# Patient Record
Sex: Female | Born: 1965 | Race: White | Hispanic: No | Marital: Married | Smoking: Never smoker
Health system: Southern US, Community
[De-identification: ages and names within clinical notes are randomized; demographics above are authoritative.]

## PROBLEM LIST (undated history)

## (undated) DIAGNOSIS — K649 Unspecified hemorrhoids: Secondary | ICD-10-CM

## (undated) DIAGNOSIS — E039 Hypothyroidism, unspecified: Secondary | ICD-10-CM

## (undated) DIAGNOSIS — C50419 Malignant neoplasm of upper-outer quadrant of unspecified female breast: Secondary | ICD-10-CM

## (undated) DIAGNOSIS — Z9221 Personal history of antineoplastic chemotherapy: Secondary | ICD-10-CM

## (undated) DIAGNOSIS — E038 Other specified hypothyroidism: Secondary | ICD-10-CM

## (undated) DIAGNOSIS — F419 Anxiety disorder, unspecified: Secondary | ICD-10-CM

## (undated) DIAGNOSIS — Z9889 Other specified postprocedural states: Secondary | ICD-10-CM

## (undated) DIAGNOSIS — Z923 Personal history of irradiation: Secondary | ICD-10-CM

## (undated) DIAGNOSIS — Z1379 Encounter for other screening for genetic and chromosomal anomalies: Secondary | ICD-10-CM

## (undated) DIAGNOSIS — R112 Nausea with vomiting, unspecified: Secondary | ICD-10-CM

## (undated) DIAGNOSIS — Z1371 Encounter for nonprocreative screening for genetic disease carrier status: Secondary | ICD-10-CM

## (undated) DIAGNOSIS — F988 Other specified behavioral and emotional disorders with onset usually occurring in childhood and adolescence: Secondary | ICD-10-CM

## (undated) DIAGNOSIS — R011 Cardiac murmur, unspecified: Secondary | ICD-10-CM

## (undated) DIAGNOSIS — R55 Syncope and collapse: Secondary | ICD-10-CM

## (undated) DIAGNOSIS — Z853 Personal history of malignant neoplasm of breast: Secondary | ICD-10-CM

## (undated) DIAGNOSIS — C50919 Malignant neoplasm of unspecified site of unspecified female breast: Secondary | ICD-10-CM

## (undated) DIAGNOSIS — G5603 Carpal tunnel syndrome, bilateral upper limbs: Secondary | ICD-10-CM

## (undated) HISTORY — DX: Malignant neoplasm of unspecified site of unspecified female breast: C50.919

## (undated) HISTORY — DX: Personal history of malignant neoplasm of breast: Z85.3

## (undated) HISTORY — PX: OTHER SURGICAL HISTORY: SHX169

## (undated) HISTORY — DX: Anxiety disorder, unspecified: F41.9

## (undated) HISTORY — PX: CYSTOSCOPY: SUR368

## (undated) HISTORY — DX: Hypothyroidism, unspecified: E03.9

## (undated) HISTORY — DX: Cardiac murmur, unspecified: R01.1

## (undated) HISTORY — DX: Syncope and collapse: R55

## (undated) HISTORY — DX: Unspecified hemorrhoids: K64.9

## (undated) HISTORY — DX: Other specified hypothyroidism: E03.8

## (undated) HISTORY — DX: Encounter for nonprocreative screening for genetic disease carrier status: Z13.71

## (undated) HISTORY — DX: Encounter for other screening for genetic and chromosomal anomalies: Z13.79

## (undated) HISTORY — DX: Other specified behavioral and emotional disorders with onset usually occurring in childhood and adolescence: F98.8

## (undated) HISTORY — DX: Malignant neoplasm of upper-outer quadrant of unspecified female breast: C50.419

---
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
## 1994-04-11 ENCOUNTER — Encounter: Payer: Self-pay | Admitting: Family Medicine

## 1994-04-11 LAB — CONVERTED CEMR LAB
Blood Glucose, Fasting: 89 mg/dL
Pap Smear: NORMAL
RBC count: 4.54 10*6/uL
TSH: 2 microintl units/mL
WBC, blood: 7.4 10*3/uL

## 1995-03-20 ENCOUNTER — Encounter: Payer: Self-pay | Admitting: Family Medicine

## 1995-03-20 LAB — CONVERTED CEMR LAB: Pap Smear: NORMAL

## 1995-12-06 ENCOUNTER — Encounter: Payer: Self-pay | Admitting: Family Medicine

## 1995-12-06 LAB — CONVERTED CEMR LAB: Blood Glucose, Fasting: 90 mg/dL

## 1995-12-09 ENCOUNTER — Encounter: Payer: Self-pay | Admitting: Family Medicine

## 1996-03-30 ENCOUNTER — Encounter: Payer: Self-pay | Admitting: Family Medicine

## 1996-03-30 LAB — CONVERTED CEMR LAB: WBC, blood: 10.6 10*3/uL

## 1996-05-05 ENCOUNTER — Encounter: Payer: Self-pay | Admitting: Family Medicine

## 1996-05-05 LAB — CONVERTED CEMR LAB: Blood Glucose, Fasting: 81 mg/dL

## 2004-04-03 ENCOUNTER — Encounter: Payer: Self-pay | Admitting: Family Medicine

## 2004-04-03 LAB — CONVERTED CEMR LAB
Blood Glucose, Fasting: 80 mg/dL
WBC, blood: 6.4 10*3/uL

## 2004-04-18 ENCOUNTER — Encounter: Payer: Self-pay | Admitting: Family Medicine

## 2004-04-18 ENCOUNTER — Other Ambulatory Visit: Admission: RE | Admit: 2004-04-18 | Discharge: 2004-04-18 | Payer: Self-pay | Admitting: Family Medicine

## 2004-04-18 LAB — CONVERTED CEMR LAB: Pap Smear: NORMAL

## 2006-04-16 ENCOUNTER — Ambulatory Visit: Payer: Self-pay | Admitting: Family Medicine

## 2006-04-22 ENCOUNTER — Encounter: Payer: Self-pay | Admitting: Family Medicine

## 2006-04-22 LAB — CONVERTED CEMR LAB
RBC count: 4.68 10*6/uL
TSH: 3.445 microintl units/mL
WBC, blood: 7.9 10*3/uL

## 2006-04-30 ENCOUNTER — Other Ambulatory Visit: Admission: RE | Admit: 2006-04-30 | Discharge: 2006-04-30 | Payer: Self-pay | Admitting: Family Medicine

## 2006-04-30 ENCOUNTER — Encounter (INDEPENDENT_AMBULATORY_CARE_PROVIDER_SITE_OTHER): Payer: Self-pay | Admitting: Specialist

## 2006-04-30 ENCOUNTER — Ambulatory Visit: Payer: Self-pay | Admitting: Family Medicine

## 2006-04-30 LAB — CONVERTED CEMR LAB: Pap Smear: NORMAL

## 2007-09-01 ENCOUNTER — Ambulatory Visit: Payer: Self-pay | Admitting: Family Medicine

## 2007-10-23 DIAGNOSIS — Z9221 Personal history of antineoplastic chemotherapy: Secondary | ICD-10-CM

## 2007-10-23 DIAGNOSIS — Z923 Personal history of irradiation: Secondary | ICD-10-CM

## 2007-10-23 DIAGNOSIS — Z853 Personal history of malignant neoplasm of breast: Secondary | ICD-10-CM

## 2007-10-23 DIAGNOSIS — C50919 Malignant neoplasm of unspecified site of unspecified female breast: Secondary | ICD-10-CM

## 2007-10-23 HISTORY — DX: Personal history of irradiation: Z92.3

## 2007-10-23 HISTORY — DX: Personal history of antineoplastic chemotherapy: Z92.21

## 2007-10-23 HISTORY — DX: Personal history of malignant neoplasm of breast: Z85.3

## 2007-10-23 HISTORY — DX: Malignant neoplasm of unspecified site of unspecified female breast: C50.919

## 2007-11-24 ENCOUNTER — Ambulatory Visit: Payer: Self-pay | Admitting: Family Medicine

## 2007-11-24 DIAGNOSIS — E7800 Pure hypercholesterolemia, unspecified: Secondary | ICD-10-CM

## 2007-11-25 ENCOUNTER — Encounter: Payer: Self-pay | Admitting: Family Medicine

## 2007-12-26 ENCOUNTER — Other Ambulatory Visit: Admission: RE | Admit: 2007-12-26 | Discharge: 2007-12-26 | Payer: Self-pay | Admitting: Family Medicine

## 2007-12-26 ENCOUNTER — Ambulatory Visit: Payer: Self-pay | Admitting: Family Medicine

## 2007-12-26 ENCOUNTER — Encounter: Payer: Self-pay | Admitting: Family Medicine

## 2007-12-26 DIAGNOSIS — F900 Attention-deficit hyperactivity disorder, predominantly inattentive type: Secondary | ICD-10-CM

## 2007-12-31 ENCOUNTER — Encounter: Payer: Self-pay | Admitting: Family Medicine

## 2007-12-31 DIAGNOSIS — Z872 Personal history of diseases of the skin and subcutaneous tissue: Secondary | ICD-10-CM | POA: Insufficient documentation

## 2008-01-06 ENCOUNTER — Encounter: Payer: Self-pay | Admitting: Family Medicine

## 2008-01-08 ENCOUNTER — Encounter (INDEPENDENT_AMBULATORY_CARE_PROVIDER_SITE_OTHER): Payer: Self-pay | Admitting: *Deleted

## 2008-01-13 ENCOUNTER — Ambulatory Visit: Payer: Self-pay | Admitting: Family Medicine

## 2008-01-13 DIAGNOSIS — F410 Panic disorder [episodic paroxysmal anxiety] without agoraphobia: Secondary | ICD-10-CM | POA: Insufficient documentation

## 2008-01-13 DIAGNOSIS — F331 Major depressive disorder, recurrent, moderate: Secondary | ICD-10-CM | POA: Insufficient documentation

## 2008-01-13 DIAGNOSIS — F419 Anxiety disorder, unspecified: Secondary | ICD-10-CM

## 2008-01-26 ENCOUNTER — Ambulatory Visit: Payer: Self-pay | Admitting: Family Medicine

## 2008-03-03 ENCOUNTER — Telehealth: Payer: Self-pay | Admitting: Family Medicine

## 2008-03-30 ENCOUNTER — Encounter: Payer: Self-pay | Admitting: Family Medicine

## 2008-04-06 ENCOUNTER — Telehealth: Payer: Self-pay | Admitting: Family Medicine

## 2008-04-06 ENCOUNTER — Encounter: Payer: Self-pay | Admitting: Family Medicine

## 2008-04-19 ENCOUNTER — Ambulatory Visit: Payer: Self-pay | Admitting: Family Medicine

## 2008-04-19 DIAGNOSIS — K648 Other hemorrhoids: Secondary | ICD-10-CM

## 2008-04-30 ENCOUNTER — Encounter: Payer: Self-pay | Admitting: Family Medicine

## 2008-05-06 ENCOUNTER — Encounter: Payer: Self-pay | Admitting: Family Medicine

## 2008-05-07 DIAGNOSIS — C50919 Malignant neoplasm of unspecified site of unspecified female breast: Secondary | ICD-10-CM

## 2008-05-07 HISTORY — PX: BREAST BIOPSY: SHX20

## 2008-05-10 ENCOUNTER — Encounter: Payer: Self-pay | Admitting: Family Medicine

## 2008-05-10 ENCOUNTER — Ambulatory Visit: Payer: Self-pay | Admitting: General Surgery

## 2008-05-21 ENCOUNTER — Encounter: Payer: Self-pay | Admitting: Family Medicine

## 2008-05-22 ENCOUNTER — Ambulatory Visit: Payer: Self-pay | Admitting: Internal Medicine

## 2008-05-24 ENCOUNTER — Ambulatory Visit: Payer: Self-pay | Admitting: General Surgery

## 2008-05-27 ENCOUNTER — Encounter: Payer: Self-pay | Admitting: Family Medicine

## 2008-05-27 ENCOUNTER — Ambulatory Visit: Payer: Self-pay | Admitting: General Surgery

## 2008-05-27 DIAGNOSIS — Z853 Personal history of malignant neoplasm of breast: Secondary | ICD-10-CM | POA: Insufficient documentation

## 2008-05-27 DIAGNOSIS — C50412 Malignant neoplasm of upper-outer quadrant of left female breast: Secondary | ICD-10-CM

## 2008-05-27 DIAGNOSIS — C50419 Malignant neoplasm of upper-outer quadrant of unspecified female breast: Secondary | ICD-10-CM

## 2008-05-27 DIAGNOSIS — Z171 Estrogen receptor negative status [ER-]: Secondary | ICD-10-CM

## 2008-05-27 HISTORY — PX: OTHER SURGICAL HISTORY: SHX169

## 2008-05-27 HISTORY — PX: BREAST LUMPECTOMY: SHX2

## 2008-05-27 HISTORY — DX: Malignant neoplasm of upper-outer quadrant of unspecified female breast: C50.419

## 2008-05-31 ENCOUNTER — Encounter: Payer: Self-pay | Admitting: Family Medicine

## 2008-06-04 ENCOUNTER — Ambulatory Visit: Payer: Self-pay | Admitting: Internal Medicine

## 2008-06-14 ENCOUNTER — Other Ambulatory Visit: Payer: Self-pay

## 2008-06-16 ENCOUNTER — Ambulatory Visit: Payer: Self-pay | Admitting: General Surgery

## 2008-06-22 ENCOUNTER — Ambulatory Visit: Payer: Self-pay | Admitting: Internal Medicine

## 2008-07-12 ENCOUNTER — Encounter: Payer: Self-pay | Admitting: Family Medicine

## 2008-07-22 ENCOUNTER — Ambulatory Visit: Payer: Self-pay | Admitting: Internal Medicine

## 2008-08-22 ENCOUNTER — Ambulatory Visit: Payer: Self-pay | Admitting: Internal Medicine

## 2008-08-26 ENCOUNTER — Encounter: Payer: Self-pay | Admitting: Family Medicine

## 2008-09-21 ENCOUNTER — Ambulatory Visit: Payer: Self-pay | Admitting: Internal Medicine

## 2008-10-22 ENCOUNTER — Ambulatory Visit: Payer: Self-pay | Admitting: Internal Medicine

## 2008-11-22 ENCOUNTER — Ambulatory Visit: Payer: Self-pay | Admitting: Internal Medicine

## 2008-11-22 ENCOUNTER — Encounter: Payer: Self-pay | Admitting: Family Medicine

## 2008-12-20 ENCOUNTER — Ambulatory Visit: Payer: Self-pay | Admitting: Internal Medicine

## 2009-01-20 ENCOUNTER — Ambulatory Visit: Payer: Self-pay | Admitting: Internal Medicine

## 2009-02-09 ENCOUNTER — Ambulatory Visit: Payer: Self-pay | Admitting: Family Medicine

## 2009-02-19 ENCOUNTER — Ambulatory Visit: Payer: Self-pay | Admitting: Internal Medicine

## 2009-03-22 ENCOUNTER — Ambulatory Visit: Payer: Self-pay | Admitting: Internal Medicine

## 2009-03-30 ENCOUNTER — Ambulatory Visit: Payer: Self-pay | Admitting: General Surgery

## 2009-04-08 ENCOUNTER — Encounter: Payer: Self-pay | Admitting: Family Medicine

## 2009-04-12 ENCOUNTER — Encounter: Payer: Self-pay | Admitting: Family Medicine

## 2009-04-21 ENCOUNTER — Ambulatory Visit: Payer: Self-pay | Admitting: Internal Medicine

## 2009-05-22 ENCOUNTER — Ambulatory Visit: Payer: Self-pay | Admitting: Internal Medicine

## 2009-06-08 LAB — HM MAMMOGRAPHY: HM Mammogram: NORMAL

## 2009-06-10 ENCOUNTER — Encounter: Payer: Self-pay | Admitting: Family Medicine

## 2009-06-22 ENCOUNTER — Ambulatory Visit: Payer: Self-pay | Admitting: Internal Medicine

## 2009-07-14 ENCOUNTER — Encounter: Payer: Self-pay | Admitting: Family Medicine

## 2009-07-22 ENCOUNTER — Ambulatory Visit: Payer: Self-pay | Admitting: Internal Medicine

## 2009-07-22 ENCOUNTER — Ambulatory Visit: Payer: Self-pay | Admitting: Radiation Oncology

## 2009-07-22 HISTORY — PX: COLONOSCOPY: SHX174

## 2009-07-28 ENCOUNTER — Ambulatory Visit: Payer: Self-pay | Admitting: Gastroenterology

## 2009-07-28 LAB — HM COLONOSCOPY

## 2009-08-03 ENCOUNTER — Encounter: Payer: Self-pay | Admitting: Family Medicine

## 2009-08-11 ENCOUNTER — Ambulatory Visit: Payer: Self-pay | Admitting: Family Medicine

## 2009-08-16 ENCOUNTER — Other Ambulatory Visit: Admission: RE | Admit: 2009-08-16 | Discharge: 2009-08-16 | Payer: Self-pay | Admitting: Family Medicine

## 2009-08-16 ENCOUNTER — Encounter: Payer: Self-pay | Admitting: Family Medicine

## 2009-08-16 ENCOUNTER — Ambulatory Visit: Payer: Self-pay | Admitting: Family Medicine

## 2009-08-16 DIAGNOSIS — E039 Hypothyroidism, unspecified: Secondary | ICD-10-CM

## 2009-08-16 DIAGNOSIS — E038 Other specified hypothyroidism: Secondary | ICD-10-CM | POA: Insufficient documentation

## 2009-08-22 ENCOUNTER — Ambulatory Visit: Payer: Self-pay | Admitting: Internal Medicine

## 2009-08-22 ENCOUNTER — Ambulatory Visit: Payer: Self-pay | Admitting: Radiation Oncology

## 2009-09-21 ENCOUNTER — Ambulatory Visit: Payer: Self-pay | Admitting: Internal Medicine

## 2009-09-22 ENCOUNTER — Ambulatory Visit: Payer: Self-pay | Admitting: General Surgery

## 2009-10-20 ENCOUNTER — Encounter: Payer: Self-pay | Admitting: Family Medicine

## 2009-11-04 ENCOUNTER — Ambulatory Visit: Payer: Self-pay | Admitting: Internal Medicine

## 2009-11-22 ENCOUNTER — Ambulatory Visit: Payer: Self-pay | Admitting: Internal Medicine

## 2009-12-20 ENCOUNTER — Ambulatory Visit: Payer: Self-pay | Admitting: Internal Medicine

## 2010-01-20 ENCOUNTER — Ambulatory Visit: Payer: Self-pay | Admitting: Internal Medicine

## 2010-02-07 ENCOUNTER — Ambulatory Visit: Payer: Self-pay | Admitting: Family Medicine

## 2010-02-15 ENCOUNTER — Ambulatory Visit: Payer: Self-pay | Admitting: Family Medicine

## 2010-02-15 ENCOUNTER — Other Ambulatory Visit: Admission: RE | Admit: 2010-02-15 | Discharge: 2010-02-15 | Payer: Self-pay | Admitting: Family Medicine

## 2010-02-19 ENCOUNTER — Ambulatory Visit: Payer: Self-pay | Admitting: Internal Medicine

## 2010-02-23 ENCOUNTER — Encounter (INDEPENDENT_AMBULATORY_CARE_PROVIDER_SITE_OTHER): Payer: Self-pay | Admitting: *Deleted

## 2010-02-23 LAB — CONVERTED CEMR LAB: Pap Smear: NEGATIVE

## 2010-03-22 ENCOUNTER — Ambulatory Visit: Payer: Self-pay | Admitting: Internal Medicine

## 2010-03-31 ENCOUNTER — Ambulatory Visit: Payer: Self-pay | Admitting: General Surgery

## 2010-04-25 ENCOUNTER — Encounter: Payer: Self-pay | Admitting: Family Medicine

## 2010-04-26 ENCOUNTER — Ambulatory Visit: Payer: Self-pay | Admitting: Internal Medicine

## 2010-04-27 LAB — CANCER ANTIGEN 27.29: CA 27.29: 18.6 U/mL (ref 0.0–38.6)

## 2010-05-22 ENCOUNTER — Ambulatory Visit: Payer: Self-pay | Admitting: Internal Medicine

## 2010-05-30 ENCOUNTER — Encounter (INDEPENDENT_AMBULATORY_CARE_PROVIDER_SITE_OTHER): Payer: Self-pay | Admitting: *Deleted

## 2010-08-10 ENCOUNTER — Ambulatory Visit: Payer: Self-pay | Admitting: Radiation Oncology

## 2010-08-10 ENCOUNTER — Encounter: Payer: Self-pay | Admitting: Family Medicine

## 2010-08-22 ENCOUNTER — Ambulatory Visit: Payer: Self-pay | Admitting: Radiation Oncology

## 2010-10-22 ENCOUNTER — Ambulatory Visit: Payer: Self-pay | Admitting: Radiation Oncology

## 2010-10-26 ENCOUNTER — Ambulatory Visit: Payer: Self-pay | Admitting: General Surgery

## 2010-11-07 ENCOUNTER — Encounter: Payer: Self-pay | Admitting: Family Medicine

## 2010-11-08 ENCOUNTER — Other Ambulatory Visit: Payer: Self-pay | Admitting: Family Medicine

## 2010-11-08 ENCOUNTER — Ambulatory Visit
Admission: RE | Admit: 2010-11-08 | Discharge: 2010-11-08 | Payer: Self-pay | Source: Home / Self Care | Attending: Family Medicine | Admitting: Family Medicine

## 2010-11-08 LAB — LIPID PANEL
Cholesterol: 136 mg/dL (ref 0–200)
HDL: 32.4 mg/dL — ABNORMAL LOW (ref >39.00)
LDL Cholesterol: 89 mg/dL (ref 0–99)
Total CHOL/HDL Ratio: 4
Triglycerides: 74 mg/dL (ref 0.0–149.0)
VLDL: 14.8 mg/dL (ref 0.0–40.0)

## 2010-11-08 LAB — HEPATIC FUNCTION PANEL
ALT: 21 U/L (ref 0–35)
AST: 18 U/L (ref 0–37)
Albumin: 4.1 g/dL (ref 3.5–5.2)
Alkaline Phosphatase: 59 U/L (ref 39–117)
Bilirubin, Direct: 0.1 mg/dL (ref 0.0–0.3)
Total Bilirubin: 0.6 mg/dL (ref 0.3–1.2)
Total Protein: 6.5 g/dL (ref 6.0–8.3)

## 2010-11-08 LAB — BASIC METABOLIC PANEL
BUN: 13 mg/dL (ref 6–23)
CO2: 30 mEq/L (ref 19–32)
Calcium: 9.4 mg/dL (ref 8.4–10.5)
Chloride: 106 mEq/L (ref 96–112)
Creatinine, Ser: 0.7 mg/dL (ref 0.4–1.2)
GFR: 90.12 mL/min (ref >60.00)
Glucose, Bld: 87 mg/dL (ref 70–99)
Potassium: 4.7 mEq/L (ref 3.5–5.1)
Sodium: 140 mEq/L (ref 135–145)

## 2010-11-08 LAB — CBC WITH DIFFERENTIAL/PLATELET
Basophils Absolute: 0 10*3/uL (ref 0.0–0.1)
Basophils Relative: 0.3 % (ref 0.0–3.0)
Eosinophils Absolute: 0.1 10*3/uL (ref 0.0–0.7)
Eosinophils Relative: 1.8 % (ref 0.0–5.0)
HCT: 41 % (ref 36.0–46.0)
Hemoglobin: 13.9 g/dL (ref 12.0–15.0)
Lymphocytes Relative: 37.5 % (ref 12.0–46.0)
Lymphs Abs: 2.6 10*3/uL (ref 0.7–4.0)
MCHC: 33.8 g/dL (ref 30.0–36.0)
MCV: 91.3 fl (ref 78.0–100.0)
Monocytes Absolute: 0.7 10*3/uL (ref 0.1–1.0)
Monocytes Relative: 9.5 % (ref 3.0–12.0)
Neutro Abs: 3.5 10*3/uL (ref 1.4–7.7)
Neutrophils Relative %: 50.9 % (ref 43.0–77.0)
Platelets: 279 10*3/uL (ref 150.0–400.0)
RBC: 4.49 Mil/uL (ref 3.87–5.11)
RDW: 14.1 % (ref 11.5–14.6)
WBC: 6.9 10*3/uL (ref 4.5–10.5)

## 2010-11-08 LAB — TSH: TSH: 4.59 u[IU]/mL (ref 0.35–5.50)

## 2010-11-08 LAB — T4, FREE: Free T4: 0.72 ng/dL (ref 0.60–1.60)

## 2010-11-15 ENCOUNTER — Ambulatory Visit
Admission: RE | Admit: 2010-11-15 | Discharge: 2010-11-15 | Payer: Self-pay | Source: Home / Self Care | Attending: Family Medicine | Admitting: Family Medicine

## 2010-11-22 NOTE — Letter (Signed)
Summary: [facility]  [facility]   Imported By: Maryln Gottron 08/28/2010 13:36:00  _____________________________________________________________________  External Attachment:    Type:   Image     Comment:   External Document

## 2010-11-22 NOTE — Letter (Signed)
Summary: Nadara Eaton letter  [place] at [facility]  [address] [place], [place] [address]   Phone: [phone]  Fax: [phone]       05/30/2010 MRN: [id number]  ALDA GAULTNEY [address] [place], [place]  [address]  Dear Ms. [facility] [place], and  announce the retirement of Arta Silence, M.D., from full-time practice at the [facility] office effective April 20, 2010 and his plans of returning part-time.  It is important to Dr. Hetty Ely and to our practice that you understand that [facility] has seven physicians in our office for your health care needs.  We will continue to offer the same exceptional care that you have today.    Dr. Hetty Ely has spoken to many of you about his plans for retirement and returning part-time in the fall.   We will continue to work with you through the transition to schedule appointments for you in the office and meet the high standards that [place] is committed to.   Again, it is with great pleasure that we share the news that Dr. Hetty Ely will return to [facility] at [facility] in October of 2011 with a reduced schedule.    If you have any questions, or would like to request an appointment with one of our physicians, please call us at [phone] and press the option for Scheduling an appointment.  We take pleasure in providing you with excellent patient care and look forward to seeing you at your next office visit.  Our [facility] Physicians are:  Tillman Abide, M.D. Laurita Quint, M.D. Roxy Manns, M.D. Kerby Nora, M.D. Hannah Beat, M.D. Ruthe Mannan, M.D. We proudly welcomed Raechel Ache, M.D. and Eustaquio Boyden, M.D. to the practice in July/August 2011.  Sincerely,  [facility]

## 2010-11-22 NOTE — Letter (Signed)
Summary: Dr.Jeffrey Byrnett,Surgeon,Note  Dr.Jeffrey Byrnett,Surgeon,Note   Imported By: Beau Fanny 10/27/2009 11:10:13  _____________________________________________________________________  External Attachment:    Type:   Image     Comment:   External Document

## 2010-11-22 NOTE — Letter (Signed)
Summary: [facility]  [facility]   Imported By: Lanelle Bal 05/03/2010 11:40:40  _____________________________________________________________________  External Attachment:    Type:   Image     Comment:   External Document  Appended Document: [facility]    Clinical Lists Changes

## 2010-11-22 NOTE — Assessment & Plan Note (Signed)
Summary: 6 MONTH FOLLOW UP/RBHALSO WANTS REPEAT PAP BLOCK 10:30 TO ALL...   Vital Signs:  Patient profile:   45 year old female Height:      66 inches Weight:      202.25 pounds BMI:     32.76 Temp:     98.2 degrees F oral Pulse rate:   80 / minute Pulse rhythm:   regular BP sitting:   102 / 68  (right arm) Cuff size:   large  Vitals Entered By: Sydell Axon LPN (February 15, 2010 9:25 AM)  CC: Follow-up on thyroid and repeat pap, Preventive Care   History of Present Illness: Pt here to repeat Pap as her last 6 mos ago had no transformation zone cells. She has had breast cancer so doesn't want to take chances and wants to repeat today. She is also here for thyroid discussion as she repeated her labs last week. She feels well and is on new lifestyle with her husband who has lost weight. She has lost one pound from last visit but looks healthier.  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 Take One By Mouth Daily  Allergies: 1)  ! * Sutures 2)  ! Adhesive Tape 1/2"x10yd (Adhesive Tape)  Physical Exam  General:  Well-developed,well-nourished,in no acute distress; alert,appropriate and cooperative throughout examination Head:  Normocephalic and atraumatic without obvious abnormalities. No apparent alopecia or balding. Eyes:  No corneal or conjunctival inflammation noted. EOMI. Perrla. Funduscopic exam benign, without hemorrhages, exudates or papilledema. Vision grossly normal. Ears:  External ear exam shows no significant lesions or deformities.   Otoscopic examination reveals clear canals, tympanic membranes are intact bilaterally without bulging, retraction, inflammation or discharge. Hearing is grossly normal bilaterally. Nose:  External nasal examination shows no deformity or inflammation. Nasal mucosa are pink and moist without lesions or exudates. Mouth:  Oral mucosa and oropharynx without lesions or exudates.  Teeth in good repair. Neck:  No deformities, masses, or tenderness noted. Lungs:  Normal respiratory effort, chest expands symmetrically. Lungs are clear to auscultation, no crackles or wheezes. Heart:  Normal rate and regular rhythm. S1 and S2 normal without gallop, murmur, click, rub or other extra sounds. Rectal:  Not done. Genitalia:  Pelvic Exam:        External: normal female genitalia without lesions or masses        Vagina: normal without lesions or masses        Cervix: normal without lesions or masses        Adnexa: normal bimanual exam without masses or fullness        Uterus: normal by palpation        Pap smear: performed   Impression & Recommendations:  Problem # 1:  CARCINOMA, INFILTRATING DUCTAL, LEFT BREAST (ICD-174.9) Redid Pap today to insure no probs dure to lack of transition zone last time 6 mos ago.  Problem # 2:  HYPOTHYROIDISM, BORDERLINE (ICD-244.9) Assessment: Improved  Back to Euthyroid on no meds. Perhaps effect of chemo or radiation. Will follow and repeat again at Comp Exam..  Complete Medication List: 1)  Multivitamins  Tabs (Multiple vitamin) .... Take one by mouth daily  PAP Screening:    Last PAP smear:  08/16/2009  Mammogram Screening:    Last Mammogram:  06/08/2009  Osteoporosis Risk Assessment:  Risk Factors for Fracture or Low Bone Density:   Race (White or Asian):     yes   Smoking status:       never   Thyroid disease:     yes  Immunization & Chemoprophylaxis:    Tetanus vaccine: Td  (11/22/2001)  Patient Instructions: 1)  RTC 10/11 for Comp Exam, labs  prior.  Current Allergies (reviewed today): ! * SUTURES ! ADHESIVE TAPE 1/2"X10YD (ADHESIVE TAPE)

## 2010-11-22 NOTE — Letter (Signed)
Summary: Results Follow up Letter  [place] [facility]  [address] [place], [place] [address]   Phone: [phone]  Fax: [phone]    02/23/2010 MRN: [id number]  Susan Soto [address] [place], [place]  [address]  Dear Ms. Zeisler,  The following are the results of your recent test(s):  Test         Result    Pap Smear:        Normal __X___  Not Normal _____ Comments: Please repeat in one year. ______________________________________________________ Cholesterol: LDL(Bad cholesterol):         Your goal is less than:         HDL (Good cholesterol):       Your goal is more than: Comments:  ______________________________________________________ Mammogram:        Normal _____  Not Normal _____ Comments:  ___________________________________________________________________ Hemoccult:        Normal _____  Not normal _______ Comments:    _____________________________________________________________________ Other Tests:    We routinely do not discuss normal results over the telephone.  If you desire a copy of the results, or you have any questions about this information we can discuss them at your next office visit.   Sincerely,     Laurita Quint, MD

## 2010-11-23 NOTE — Assessment & Plan Note (Signed)
Summary: CPX/DLO   Vital Signs:  Patient profile:   45 year old female Height:      66 inches Weight:      211.50 pounds BMI:     34.26 Temp:     98.4 degrees F oral Pulse rate:   92 / minute Pulse rhythm:   regular BP sitting:   106 / 76  (right arm) Cuff size:   large  Vitals Entered By: Delilah Shan CMA Duncan Dull) (November 15, 2010 11:03 AM) CC: CPX   History of Present Illness: Pt had mammo last month and then was seen by Dr Lemar Livings and said fine. Will get regular mammo in 6 mos. Her Pap in Apr was nml. Her PE this time last year was fine. She has no complaints and feels well. She feels her chemo has worsened her ADD and has caused menopause in that she hasn't had a period since her chemo. She reads a lot of info on ADD. She is worried about upcoming audit at work. We discussed "Chemo brain." She had been on Wellbutrin in the past but does not feel she tolerated that well in that she had skin reaction she is sure waqs from that medication and does not want to tr it again. She does not want to be back on stimulants and I would concur with that in the face of her anxiety.  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 Take One By Mouth Daily  Allergies: 1)  ! * Sutures 2)  ! Adhesive Tape 1/2"x10yd (Adhesive Tape) 3)   ! Wellbutrin  Past History:  Past Surgical History: Last updated: 08/16/2009 NSVD x 2 H/O BARTHOLIN'S CYSECTOMY Cystectomy of Sutures from episiotomy. ECHO --NORMAL :(03/1996) L BREAST BIOPSY INFITR DUCTAL CA (DR BYRNETT) 05/07/08 L BREAST WIDE EXCISION WITH SENTINEL NODE BX (DR BYRNETT) 05/27/08 Colonoscopy INT HEMM  (Dr Bluford Kaufmann)  08/02/2009  Family History: Last updated: 11/15/2010 Father dec 73s Heroine overdose. (pt in 3rd grade) Mother A 60  shot 27 yrs ago R arm across chest and out L arm PTSD HTN Chronic Pain Fibromyalgia ADD Brother A 40 ADHD Brother A 33 Anxiety CV: +MGM R.F. HBP: +PGM DM: NEGATIVE GOUT/ARTHRITIS PROSTATE CANCER +(LUNG PGF (SMOKER) BREAST CANCER +MGM OVARIAN/UTERINE CANCER: NEGATIVE COLON CANCER:  DEPRESSION: + SELF// THROUGHOUT MOTHER ETOH ABUSE : NEGATIVE DRUG ABUSE : +FATHER , BOTH SIDES OTHER: + STROKE DECEASED PGM HYPOGLYCEMIC : MGM  Social History: Last updated: 12/31/2007 Occupation: Child psychotherapist  Lives with husband TWO children  Risk Factors: Alcohol Use: 0 (11/15/2010) Caffeine Use: 2 (11/15/2010) Exercise: no (11/15/2010)  Risk Factors: Smoking Status: never (11/15/2010) Passive Smoke Exposure: no (11/15/2010)  Family History: Father dec 65s Heroine overdose. (pt in 3rd grade) Mother A 60  shot 27 yrs ago R arm across chest and out L arm PTSD HTN Chronic  Pain Fibromyalgia ADD Brother A 40 ADHD Brother A 33 Anxiety CV: +MGM R.F. HBP: +PGM DM: NEGATIVE GOUT/ARTHRITIS PROSTATE CANCER +(LUNG PGF (SMOKER) BREAST CANCER +MGM OVARIAN/UTERINE CANCER: NEGATIVE COLON CANCER:  DEPRESSION: + SELF// THROUGHOUT MOTHER ETOH ABUSE : NEGATIVE DRUG ABUSE : +FATHER , BOTH SIDES OTHER: + STROKE DECEASED PGM HYPOGLYCEMIC : MGM  Review of Systems General:  Complains of sweats; denies chills, fatigue, fever, weakness, and weight loss; occas since chemo. Eyes:  Denies blurring, eye irritation, and eye pain. ENT:  Denies decreased hearing,  earache, and ringing in ears. CV:  Denies chest pain or discomfort, fainting, fatigue, palpitations, shortness of breath with exertion, swelling of feet, and swelling of hands. Resp:  Denies cough, shortness of breath, and wheezing. GI:  Denies abdominal pain, bloody stools, change in bowel habits, constipation, dark tarry stools, diarrhea, indigestion, loss of appetite, nausea, vomiting, vomiting blood, and yellowish skin color. GU:  Complains of nocturia; denies discharge, dysuria, and urinary frequency. MS:  Denies joint pain, low back pain, muscle aches, cramps, and stiffness. Derm:  Complains of dryness; denies itching and rash; chronic. Neuro:  Denies numbness, poor balance, tingling, and tremors.  Physical Exam  General:  Well-developed,well-nourished,in no acute distress; alert,appropriate and cooperative throughout examination Head:  Normocephalic and atraumatic without obvious abnormalities. No apparent alopecia or balding. Sinuses NT. Eyes:  No corneal or conjunctival inflammation noted. EOMI. Perrla. Funduscopic exam benign, without hemorrhages, exudates or papilledema. Vision grossly normal. Ears:  External ear exam shows no significant lesions or deformities.  Otoscopic examination reveals clear canals, tympanic membranes are intact bilaterally without bulging, retraction, inflammation or discharge. Hearing is grossly normal bilaterally. Nose:  External nasal examination shows no deformity or inflammation. Nasal mucosa are pink and moist without lesions or exudates. Mouth:  Oral mucosa and oropharynx without lesions or exudates.  Teeth in good repair. Neck:  No deformities, masses, or tenderness noted. Chest Wall:  No deformities, masses, or tenderness noted. Breasts:  Not done, recent exam by Dr Lemar Livings Lungs:  Normal respiratory effort, chest expands symmetrically. Lungs are clear to auscultation, no crackles or wheezes. Heart:  Normal rate and regular rhythm. S1 and S2 normal  without gallop, murmur, click, rub or other extra sounds. Abdomen:  Bowel sounds positive,abdomen soft and non-tender without masses, organomegaly or hernias noted. Rectal:  Not done. Genitalia:  Not done, last 6 mos ago. Msk:  No deformity or scoliosis noted of thoracic or lumbar spine.   Pulses:  R and L carotid,radial,femoral,dorsalis pedis and posterior tibial pulses are full and equal bilaterally Extremities:  No clubbing, cyanosis, edema, or deformity noted with normal full range of motion of all joints.   Neurologic:  No cranial nerve deficits noted. Station and gait are normal. Sensory, motor and coordinative functions appear intact. Skin:  Intact without suspicious lesions or rashes Cervical Nodes:  No lymphadenopathy noted Inguinal Nodes:  No significant adenopathy Psych:  Cognition and judgment appear intact. Alert and cooperative with normal attention span and concentration. No apparent delusions, illusions, hallucinations   Impression & Recommendations:  Problem # 1:  ROUTINE GENERAL MEDICAL EXAM@HEALTH  CARE FACL (ICD-V70.0) Assessment Comment Only  Problem # 2:  HYPOTHYROIDISM, BORDERLINE (ICD-244.9) Assessment: Unchanged Well controlled and euthyroid w/o medication. Cont. Labs Reviewed: TSH: 4.59 (11/08/2010)    Chol: 136 (11/08/2010)   HDL: 32.40 (11/08/2010)   LDL: 89 (11/08/2010)   TG: 74.0 (11/08/2010)  Problem # 3:  CARCINOMA, INFILTRATING DUCTAL, LEFT BREAST (ICD-174.9) Assessment: Unchanged Followed by Dr Lemar Livings. Had recent  mammo.  Problem # 4:  ANXIETY STATE, UNSPECIFIED (ICD-300.00) Assessment: Unchanged  Will try Buspar Her updated medication list for this problem includes:    Buspirone Hcl 5 Mg Tabs (Buspirone hcl) .Marland Kitchen... 1/2 tab by mouth two times a day  Discussed medication use and relaxation techniques.   Problem # 5:  PURE HYPERCHOLESTEROLEMIA (ICD-272.0) Assessment: Unchanged Well controlled. Labs Reviewed: SGOT: 18 (11/08/2010)   SGPT: 21  (11/08/2010)   HDL:32.40 (11/08/2010)  LDL:89 (11/08/2010)  Chol:136 (11/08/2010)  Trig:74.0 (11/08/2010)  Problem # 6:  ADD (ICD-314.00) Assessment: Unchanged No stimulants. Declines Strattera. Will follow.  Complete Medication List: 1)  Juice Plus Fibre Liqd (Nutritional supplements) .... Once daily 2)  Fish Oil Oil (Fish oil) .... 900 mg. once daily 3)  Probiotics  .... Take 1 tablet by mouth once a day 4)  L-tyrosine 500 Mg Caps (Tyrosine) .... Once daily  (for add) 5)  Replens Gel (Vaginal lubricant) .... Probiotic vaginal supplement once weekly. 6)  Buspirone Hcl 5 Mg Tabs (Buspirone hcl) .... 1/2 tab by mouth two times a day  Patient Instructions: 1)  RTC one month  Prescriptions: BUSPIRONE HCL 5 MG TABS (BUSPIRONE HCL) 1/2 tab by mouth two times a day  #30 x 12   Entered and Authorized by:   Shaune Leeks MD   Signed by:   Shaune Leeks MD on 11/15/2010   Method used:   Print then Give to Patient   RxID:   [id number]    Orders Added: 1)  Est. Patient 40-64 years [[address]]    Current Allergies (reviewed today): ! * SUTURES ! ADHESIVE TAPE 1/2"X10YD (ADHESIVE TAPE) ! WELLBUTRIN

## 2010-12-06 ENCOUNTER — Ambulatory Visit: Payer: Self-pay | Admitting: Internal Medicine

## 2010-12-07 LAB — CANCER ANTIGEN 27.29: CA 27.29: 31.7 U/mL (ref 0.0–38.6)

## 2010-12-07 NOTE — Letter (Signed)
Summary: [facility]  [facility]   Imported By: Kassie Mends 11/27/2010 08:33:45  _____________________________________________________________________  External Attachment:    Type:   Image     Comment:   External Document

## 2010-12-13 ENCOUNTER — Ambulatory Visit (INDEPENDENT_AMBULATORY_CARE_PROVIDER_SITE_OTHER): Payer: PRIVATE HEALTH INSURANCE | Admitting: Family Medicine

## 2010-12-13 ENCOUNTER — Encounter: Payer: Self-pay | Admitting: Family Medicine

## 2010-12-13 DIAGNOSIS — F988 Other specified behavioral and emotional disorders with onset usually occurring in childhood and adolescence: Secondary | ICD-10-CM

## 2010-12-13 DIAGNOSIS — F411 Generalized anxiety disorder: Secondary | ICD-10-CM

## 2010-12-19 NOTE — Assessment & Plan Note (Signed)
Summary: 1 MONTH F/U JRT   Vital Signs:  Patient profile:   45 year old female Weight:      212 pounds Temp:     98.1 degrees F oral Pulse rate:   80 / minute Pulse rhythm:   regular BP sitting:   104 / 68  (right arm) Cuff size:   large  Vitals Entered By: Sydell Axon LPN (December 13, 2010 9:01 AM) CC: Follow-up visit on new medication, ran out because of the way that it was written   History of Present Illness: Pt here for 6 week followup after being tried on Buspar for anxiety, also having ADD and not wanting to try Wellbutrin again due to skin reaction and not wanting to use benzos due to ADD as well.  The Buspar helpewd take the edge off at the audit done.  She has increased to  a whole tab in the AM and then has recently increased to whole at night. She has seen some improvement but still having trouble focusing. She has piles of work on her desk and then worries about what to do first...wasting lots of time. She did fine on her audit. She cannot let herself get so worked up again...it was very stressful.  She is willing to increase Buspar first and then see if see might need nonstimulant ADD med.   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 Once Daily 2)  Fish Oil   Oil (Fish Oil) .... 900 Mg. Once Daily 3)  Probiotics .... Take 1 Tablet By Mouth Once A Day 4)  L-Tyrosine 500 Mg Caps (Tyrosine) .... Once Daily  (For Add) 5)  Replens  Gel (Vaginal Lubricant) .... Probiotic Vaginal Supplement Once Weekly. 6)  Buspirone Hcl 5 Mg  Tabs (Buspirone Hcl) .... 1/2 Tab By Mouth Two Times A Day  Allergies: 1)  ! * Sutures 2)  ! Adhesive Tape 1/2"x10yd (Adhesive Tape) 3)  ! Wellbutrin  Physical Exam  General:  Well-developed,well-nourished,in no acute distress; alert,appropriate and cooperative throughout examination Psych:  Cognition and judgment appear intact. Alert and cooperative with normal attention span and concentration. No apparent delusions, illusions, hallucinations   Impression & Recommendations:  Problem # 1:  ANXIETY STATE, UNSPECIFIED (ICD-300.00) Assessment Improved  Slightly better but still has a ways to go. Slowly increase to 15 two times a day by starting with 1/2 of 15. If becomes  too sedated in the afternoon, lower meds. Her updated medication list for this problem includes:    Buspirone Hcl 15 Mg Tabs (Buspirone hcl) ..... One tab by mouth two times a day  Discussed medication use and relaxation techniques.   Problem # 2:  ADD (ICD-314.00) Assessment: Unchanged If anxiety better controlled does not improve concentration, will try Strattera.  Complete Medication List: 1)  Juice Plus Fibre Liqd (Nutritional supplements) .... Once daily 2)  Fish Oil Oil (Fish oil) .... 900 mg. once daily 3)  Probiotics  .... Take 1 tablet by mouth once a day 4)  L-tyrosine 500 Mg Caps (Tyrosine) .... Once daily  (for add) 5)  Replens Gel (Vaginal lubricant) .... Probiotic vaginal supplement once weekly. 6)  Buspirone Hcl 15 Mg Tabs (Buspirone hcl) .... One tab by mouth two times a day  Patient Instructions: 1)  RTC 2 months Prescriptions: BUSPIRONE HCL 15 MG TABS (BUSPIRONE HCL) one tab by mouth two times a day  #60 x 12   Entered and Authorized by:   Shaune Leeks MD   Signed by:   Shaune Leeks MD on 12/13/2010   Method used:   Electronically to        [facility] [id number][facility] (retail)       [address] [place], [place]  [address]       Ph: [phone] or  [phone]       Fax: [phone]   RxID:   [id number]    Orders Added: 1)  Est. Patient Level III [[address]644]    Current Allergies (reviewed today): ! * SUTURES ! ADHESIVE TAPE 1/2"X10YD (ADHESIVE TAPE) ! WELLBUTRIN

## 2010-12-21 ENCOUNTER — Ambulatory Visit: Payer: Self-pay | Admitting: Internal Medicine

## 2010-12-30 ENCOUNTER — Encounter: Payer: Self-pay | Admitting: Family Medicine

## 2011-02-28 ENCOUNTER — Ambulatory Visit (INDEPENDENT_AMBULATORY_CARE_PROVIDER_SITE_OTHER): Payer: PRIVATE HEALTH INSURANCE | Admitting: Family Medicine

## 2011-02-28 ENCOUNTER — Encounter: Payer: Self-pay | Admitting: Family Medicine

## 2011-02-28 DIAGNOSIS — F988 Other specified behavioral and emotional disorders with onset usually occurring in childhood and adolescence: Secondary | ICD-10-CM

## 2011-02-28 DIAGNOSIS — F411 Generalized anxiety disorder: Secondary | ICD-10-CM

## 2011-02-28 NOTE — Patient Instructions (Signed)
RTC 2 months

## 2011-02-28 NOTE — Assessment & Plan Note (Addendum)
Had variable response to Buspar but finally unable to take 15mg  bid.  Do not want to prescribe anxiolytics/benzos. Will refer to Psych for combo of anxiety and ADD if nonstimulant ADD meds do not work.

## 2011-02-28 NOTE — Assessment & Plan Note (Addendum)
Pt wants to try nonstimulant meds for ADD trmt before referral to Psych.  WIll try Strattera. Given start kit to 80mg . Given enough to take for 2 mos

## 2011-02-28 NOTE — Progress Notes (Signed)
  Subjective:    Patient ID: Susan Soto, female    DOB: 1966/03/13, 45 y.o.   MRN: [id number]  HPI Pt here for two month recheck of Anxiety and ADD. She was increased stepwise last time to 15mg  bid of Buspar for her anxiety. This gotr her sick. So she tried 10mg  and couldn't tolerate that as well. She has been on nothing for the ADD hoping that getting her anxiety under control may get the ADD good enough to operate efficiently at work. She has read three books on ADD   She is biting her cheeks and grinding her teeth. Work is a lot more intense and her caseload has gotten larger. She attributes all of this to fueling her anxiety. She could not tolerate Buspar.  She also had a period in Mar after no period since her chemo for breast cancer last year., She has seen an ADD specialist in [place] in the past. Her son has seen an ADD specialist in [place].   Review of Systems  Constitutional: Negative for fever, chills, diaphoresis, activity change and fatigue.  HENT: Negative for ear pain, congestion, rhinorrhea and postnasal drip.   Eyes: Negative for redness.  Respiratory: Negative for cough, chest tightness, shortness of breath and wheezing.   Cardiovascular: Negative for chest pain.       Objective:   Physical Exam  Constitutional: She appears well-developed and well-nourished. No distress.  HENT:  Head: Normocephalic and atraumatic.  Right Ear: External ear normal.  Left Ear: External ear normal.  Nose: Nose normal.  Mouth/Throat: Oropharynx is clear and moist. No oropharyngeal exudate.  Eyes: Conjunctivae and EOM are normal. Pupils are equal, round, and reactive to light.  Neck: Normal range of motion. Neck supple. No thyromegaly present.  Cardiovascular: Normal rate, regular rhythm and normal heart sounds.   Pulmonary/Chest: Effort normal and breath sounds normal. She has no wheezes. She has no rales.  Lymphadenopathy:    She has no cervical adenopathy.  Skin: She is not  diaphoretic.  Psychiatric: She has a normal mood and affect. Her behavior is normal. Judgment and thought content normal.       Mood/affect nml but subdued.           Assessment & Plan:

## 2011-05-08 ENCOUNTER — Ambulatory Visit: Payer: PRIVATE HEALTH INSURANCE | Admitting: Family Medicine

## 2011-05-09 ENCOUNTER — Ambulatory Visit: Payer: PRIVATE HEALTH INSURANCE | Admitting: Family Medicine

## 2011-05-10 ENCOUNTER — Ambulatory Visit: Payer: PRIVATE HEALTH INSURANCE | Admitting: Family Medicine

## 2011-05-17 ENCOUNTER — Ambulatory Visit (INDEPENDENT_AMBULATORY_CARE_PROVIDER_SITE_OTHER): Payer: PRIVATE HEALTH INSURANCE | Admitting: Family Medicine

## 2011-05-17 ENCOUNTER — Encounter: Payer: Self-pay | Admitting: Family Medicine

## 2011-05-17 DIAGNOSIS — F411 Generalized anxiety disorder: Secondary | ICD-10-CM

## 2011-05-17 DIAGNOSIS — F988 Other specified behavioral and emotional disorders with onset usually occurring in childhood and adolescence: Secondary | ICD-10-CM

## 2011-05-17 MED ORDER — ATOMOXETINE HCL 80 MG PO CAPS: 80.0000 mg | ORAL_CAPSULE | Freq: Every day | ORAL | Status: DC

## 2011-05-17 NOTE — Assessment & Plan Note (Signed)
Better ability to concentrate and attend to particular subjects and tasks. Will increase dose to 80 mg of Strattera. Discussed taking with food.

## 2011-05-17 NOTE — Progress Notes (Signed)
  Subjective:    Patient ID: Susan Soto, female    DOB: 24-Nov-1965, 45 y.o.   MRN: [id number]  HPI Pt here for 2 month followup. She was given Strattera last time for ADD to see if that would help , hoping in turn to alleviate some of her stressors and the resulting anxiety therefrom. She had significant problems initially with GI upset and remembering to take it. She has tolerated it well for the last month and wants to keep taking it. It has helped with organizing and remembering and she has been more productive at her job. She has been taking it every other day and none the last two days due to being out of medication (she was given samples). She is having less anxiousness and seems to be doing better than on anything else at this point. She would like to get an even better response if possible and is willing to increase dose. She has done a fair amount of reading about the medication.   Review of Systems  Constitutional: Negative for fever, chills, diaphoresis, activity change and fatigue.  HENT: Negative for ear pain, congestion, rhinorrhea and postnasal drip.   Eyes: Negative for redness.  Respiratory: Negative for cough, chest tightness, shortness of breath and wheezing.   Cardiovascular: Negative for chest pain.  Psychiatric/Behavioral: Positive for decreased concentration (improved) and agitation (improved). The patient is nervous/anxious (improved).        Objective:   Physical Exam  Constitutional: She appears well-developed and well-nourished. No distress.  HENT:  Head: Normocephalic and atraumatic.  Right Ear: External ear normal.  Left Ear: External ear normal.  Nose: Nose normal.  Mouth/Throat: Oropharynx is clear and moist. No oropharyngeal exudate.  Eyes: Conjunctivae and EOM are normal. Pupils are equal, round, and reactive to light.  Neck: Normal range of motion. Neck supple. No thyromegaly present.  Cardiovascular: Normal rate, regular rhythm and normal heart sounds.     Pulmonary/Chest: Effort normal and breath sounds normal. She has no wheezes. She has no rales.  Lymphadenopathy:    She has no cervical adenopathy.  Skin: She is not diaphoretic.  Psychiatric: She has a normal mood and affect. Her behavior is normal. Judgment and thought content normal.          Assessment & Plan:

## 2011-05-17 NOTE — Patient Instructions (Addendum)
RTC for recheck before the end of the year.

## 2011-05-17 NOTE — Assessment & Plan Note (Signed)
Improved with better ability to concentrate. Cont Strattera at increased dose.

## 2011-05-23 ENCOUNTER — Ambulatory Visit: Payer: Self-pay | Admitting: General Surgery

## 2011-06-29 ENCOUNTER — Ambulatory Visit: Payer: Self-pay | Admitting: Internal Medicine

## 2011-06-30 LAB — CANCER ANTIGEN 27.29: CA 27.29: 22.3 U/mL (ref 0.0–38.6)

## 2011-07-23 ENCOUNTER — Ambulatory Visit: Payer: Self-pay | Admitting: Internal Medicine

## 2011-08-23 ENCOUNTER — Ambulatory Visit: Payer: Self-pay | Admitting: Internal Medicine

## 2011-10-04 ENCOUNTER — Ambulatory Visit: Payer: PRIVATE HEALTH INSURANCE | Admitting: Family Medicine

## 2011-10-10 ENCOUNTER — Encounter: Payer: Self-pay | Admitting: Family Medicine

## 2011-10-10 ENCOUNTER — Ambulatory Visit (INDEPENDENT_AMBULATORY_CARE_PROVIDER_SITE_OTHER): Payer: PRIVATE HEALTH INSURANCE | Admitting: Family Medicine

## 2011-10-10 DIAGNOSIS — F988 Other specified behavioral and emotional disorders with onset usually occurring in childhood and adolescence: Secondary | ICD-10-CM

## 2011-10-10 DIAGNOSIS — F411 Generalized anxiety disorder: Secondary | ICD-10-CM

## 2011-10-10 NOTE — Assessment & Plan Note (Signed)
Stable and adequately controlled for now. Cont Strattera 80mg .

## 2011-10-10 NOTE — Patient Instructions (Signed)
RTC when able after the end of January for PE with Dr Reece Agar, labs prior.

## 2011-10-10 NOTE — Assessment & Plan Note (Signed)
Doing well at this point due to improved concentration at work and less stress therefrom. Cont.

## 2011-10-10 NOTE — Progress Notes (Signed)
  Subjective:    Patient ID: Susan Soto, female    DOB: Mar 13, 1966, 45 y.o.   MRN: [id number]  HPI Pt here for recheck of anxiety and difficulty concentrating. She has been taking Strattera and increased the dose last time. Had had some difficulties with GI upset but handled those. Shew typically does not eaqt breakfast and takes with lunch typically. She knows to take breakfast if possible. She is doing ok although knows a stimulant would improve things. She may go back to her Psych for stimulant. She feels well and has no complaints.    Review of Systems  Constitutional: Negative for fever, chills, diaphoresis, activity change and fatigue.  HENT: Negative for ear pain, congestion, rhinorrhea and postnasal drip.   Eyes: Negative for redness.  Respiratory: Negative for cough, chest tightness, shortness of breath and wheezing.   Cardiovascular: Negative for chest pain.       Objective:   Physical Exam  Constitutional: She appears well-developed and well-nourished. No distress.  HENT:  Head: Normocephalic and atraumatic.  Right Ear: External ear normal.  Left Ear: External ear normal.  Nose: Nose normal.  Mouth/Throat: Oropharynx is clear and moist. No oropharyngeal exudate.  Eyes: Conjunctivae and EOM are normal. Pupils are equal, round, and reactive to light.  Neck: Normal range of motion. Neck supple. No thyromegaly present.  Cardiovascular: Normal rate, regular rhythm and normal heart sounds.   Pulmonary/Chest: Effort normal and breath sounds normal. She has no wheezes. She has no rales.  Lymphadenopathy:    She has no cervical adenopathy.  Skin: She is not diaphoretic.          Assessment & Plan:

## 2011-11-06 ENCOUNTER — Telehealth: Payer: Self-pay | Admitting: *Deleted

## 2011-11-06 NOTE — Telephone Encounter (Signed)
Received faxed form from pharmacy requesting prior authorization on Strattera. Called and requested that [employer] send over the paperwork to start the process. Paperwork is in your in box.

## 2011-11-07 ENCOUNTER — Encounter: Payer: Self-pay | Admitting: *Deleted

## 2011-11-07 ENCOUNTER — Other Ambulatory Visit: Payer: Self-pay | Admitting: Family Medicine

## 2011-11-07 DIAGNOSIS — E039 Hypothyroidism, unspecified: Secondary | ICD-10-CM

## 2011-11-07 DIAGNOSIS — Z0000 Encounter for general adult medical examination without abnormal findings: Secondary | ICD-10-CM

## 2011-11-07 NOTE — Telephone Encounter (Signed)
Filled and placed in my out box. 

## 2011-11-08 ENCOUNTER — Telehealth: Payer: Self-pay | Admitting: *Deleted

## 2011-11-08 NOTE — Telephone Encounter (Signed)
PA done for Strattera. Med was denied and she will need another med. Form in your IN box.

## 2011-11-11 NOTE — Telephone Encounter (Signed)
Please notify insurance did not cover straterra.  Has she seen psych for ADHD in past or been treated with stimulants in past?

## 2011-11-12 ENCOUNTER — Other Ambulatory Visit (INDEPENDENT_AMBULATORY_CARE_PROVIDER_SITE_OTHER): Payer: PRIVATE HEALTH INSURANCE

## 2011-11-12 DIAGNOSIS — Z0000 Encounter for general adult medical examination without abnormal findings: Secondary | ICD-10-CM

## 2011-11-12 DIAGNOSIS — E039 Hypothyroidism, unspecified: Secondary | ICD-10-CM

## 2011-11-12 NOTE — Telephone Encounter (Signed)
Message left for patient to return my call.  

## 2011-11-12 NOTE — Telephone Encounter (Signed)
Spoke with patient and it was an error on her part with incorrect insurance card. She corrected things with the pharmacy and med has been covered.

## 2011-11-13 LAB — BASIC METABOLIC PANEL
BUN: 8 mg/dL (ref 6–24)
CO2: 21 mmol/L (ref 20–32)
Calcium: 9.3 mg/dL (ref 8.7–10.2)
Chloride: 102 mmol/L (ref 97–108)
GFR calc Af Amer: 121 mL/min/{1.73_m2} (ref >59)
Glucose: 88 mg/dL (ref 65–99)
Potassium: 3.5 mmol/L (ref 3.5–5.2)

## 2011-11-13 LAB — LIPID PANEL
Chol/HDL Ratio: 3.2 ratio units (ref 0.0–4.4)
HDL: 48 mg/dL (ref >39)

## 2011-11-13 LAB — TSH: TSH: 5.53 u[IU]/mL — ABNORMAL HIGH (ref 0.450–4.500)

## 2011-11-13 LAB — T4, FREE: Free T4: 0.9 ng/dL (ref 0.82–1.77)

## 2011-11-19 ENCOUNTER — Other Ambulatory Visit (HOSPITAL_COMMUNITY)
Admission: RE | Admit: 2011-11-19 | Discharge: 2011-11-19 | Disposition: A | Discharge status: Home / Self Care | Payer: PRIVATE HEALTH INSURANCE | Source: Ambulatory Visit | Attending: Family Medicine | Admitting: Family Medicine

## 2011-11-19 ENCOUNTER — Ambulatory Visit (INDEPENDENT_AMBULATORY_CARE_PROVIDER_SITE_OTHER): Payer: PRIVATE HEALTH INSURANCE | Admitting: Family Medicine

## 2011-11-19 ENCOUNTER — Encounter: Payer: PRIVATE HEALTH INSURANCE | Admitting: Family Medicine

## 2011-11-19 ENCOUNTER — Encounter: Payer: Self-pay | Admitting: Family Medicine

## 2011-11-19 VITALS — BP 110/80 | HR 84 | Temp 98.6°F | Ht 65.5 in | Wt 214.8 lb

## 2011-11-19 DIAGNOSIS — Z0000 Encounter for general adult medical examination without abnormal findings: Secondary | ICD-10-CM | POA: Insufficient documentation

## 2011-11-19 DIAGNOSIS — Z124 Encounter for screening for malignant neoplasm of cervix: Secondary | ICD-10-CM

## 2011-11-19 DIAGNOSIS — F988 Other specified behavioral and emotional disorders with onset usually occurring in childhood and adolescence: Secondary | ICD-10-CM

## 2011-11-19 DIAGNOSIS — E039 Hypothyroidism, unspecified: Secondary | ICD-10-CM

## 2011-11-19 DIAGNOSIS — Z01419 Encounter for gynecological examination (general) (routine) without abnormal findings: Secondary | ICD-10-CM | POA: Insufficient documentation

## 2011-11-19 DIAGNOSIS — C50919 Malignant neoplasm of unspecified site of unspecified female breast: Secondary | ICD-10-CM

## 2011-11-19 NOTE — Assessment & Plan Note (Signed)
Discussed subclinical hypothyroidism. rtc 6 mo for recheck, as well as discussion of possible synthroid. Endorsed hair changes (s/p chemo as well), weight gain, chronic constipation. Endorses heat intolerance, denies cold intolerance.

## 2011-11-19 NOTE — Assessment & Plan Note (Addendum)
Reviewed preventative protocols and updated unless pt declined. States UTD tetanus. 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 reviewed all blood work in detail Wants to receive pap smears yearly.

## 2011-11-19 NOTE — Assessment & Plan Note (Signed)
Not an issue, removed from problem list.

## 2011-11-19 NOTE — Assessment & Plan Note (Signed)
Continue straterra. Considering starting fish oil to help add.

## 2011-11-19 NOTE — Patient Instructions (Addendum)
Good to see you today. We will keep eye on thyroid function as you're borderline low. Call us with questions. Return in 6 months for ADD recheck and thyroid check, sooner if needed.

## 2011-11-19 NOTE — Progress Notes (Signed)
Subjective:    Patient ID: Susan Soto, female    DOB: 06/04/1966, 46 y.o.   MRN: [id number]  HPI CC: CPE with pap  H/o breast cancer, followed by Dr. Lemar Livings (surgery) and Sherrlyn Hock (onc) (last mammogram this summer, normal).  Gets yearly.  Preventative: Last CPE w/ Dr. Hetty Ely. Pap smear then.  Never had abnormal pap smear.  Getting yearly pap smears.  Would like yearly. Flu at work. Tdap at work. Seat belt 100% time. Sunscreen use discussed.  LMP last week.  Prior to that 8 mo.  S/p chemo, ?perimenopausal  PA not covered for straterra - actually pt's mistake with wrong insurance card, so now covered.  Thinks stimulants work better, but prior PCP not comfortable prescribing.  Caffeine: 1 cup coffee in am Lives with husband and 1 son, daughter in college, 1 dog and 1 cat, 5-6 outside cats Occupation: SW desk job Activity: no regular activity Diet: joined [employer], good water, fruits/vegetables daily, red meat 3x/wk, fish 2x/wk.  Medications and allergies reviewed and updated in chart.  Past histories reviewed and updated if relevant as below. Patient Active Problem List  Diagnoses  . CARCINOMA, INFILTRATING DUCTAL, LEFT BREAST  . HYPOTHYROIDISM, BORDERLINE  . PURE HYPERCHOLESTEROLEMIA  . ANXIETY STATE, UNSPECIFIED  . ADD  . HEMORRHOIDS, INTERNAL, WITH BLEEDING  . URTICARIA, HX OF (RECURRENT)   Past Medical History  Diagnosis Date  . ADD (attention deficit disorder)   . Anxiety    Past Surgical History  Procedure Date  . Bartholin's cystectomy   . Episiotomy   . Cystectomy     of sutures from episiotomy  . Breast biopsy 05/07/08    Left (infitr ductal CA)- Dr. Lemar Livings  . Breast wide excision 05/27/08    Left-with sentinel node bx (Dr. Lemar Livings)   History  Substance Use Topics  . Smoking status: Never Smoker   . Smokeless tobacco: Never Used  . Alcohol Use: No   Family History  Problem Relation Age of Onset  . Drug abuse Father     Heroin  .  Hypertension Mother   . Fibromyalgia Mother   . ADD / ADHD Mother   . ADD / ADHD Brother   . Anxiety disorder Brother   . Stroke Maternal Grandmother   . Hypertension Paternal Grandmother   . Cancer Maternal Grandmother     breast  . Depression Mother    Allergies  Allergen Reactions  . Bupropion Hcl     REACTION: Hives  . Buspar (Buspirone Hcl) Nausea And Vomiting   Current Outpatient Prescriptions on File Prior to Visit  Medication Sig Dispense Refill  . atomoxetine (STRATTERA) 80 MG capsule Take 1 capsule (80 mg total) by mouth daily.  30 capsule  12  . Nutritional Supplements (JUICE PLUS FIBRE) LIQD Take by mouth daily.        . Omega-3 Fatty Acids (RA FISH OIL) 900 MG CAPS Take 1 capsule by mouth daily.         Review of Systems  Constitutional: Negative for fever, chills, activity change, appetite change, fatigue and unexpected weight change.  HENT: Negative for hearing loss and neck pain.   Eyes: Negative for visual disturbance.  Respiratory: Negative for cough, chest tightness, shortness of breath and wheezing.   Cardiovascular: Negative for chest pain, palpitations and leg swelling.  Gastrointestinal: Negative for nausea, vomiting, abdominal pain, diarrhea, constipation, blood in stool and abdominal distention.  Genitourinary: Negative for hematuria and difficulty urinating.  Musculoskeletal: Negative for myalgias and  arthralgias.  Skin: Negative for rash.  Neurological: Negative for dizziness, seizures, syncope and headaches.  Hematological: Does not bruise/bleed easily.  Psychiatric/Behavioral: Negative for dysphoric mood. The patient is not nervous/anxious.        Objective:   Physical Exam  Nursing note and vitals reviewed. Constitutional: She is oriented to person, place, and time. She appears well-developed and well-nourished. No distress.  HENT:  Head: Normocephalic and atraumatic.  Right Ear: External ear normal.  Left Ear: External ear normal.  Nose:  Nose normal.  Mouth/Throat: Oropharynx is clear and moist. No oropharyngeal exudate.  Eyes: Conjunctivae and EOM are normal. Pupils are equal, round, and reactive to light. No scleral icterus.  Neck: Normal range of motion. Neck supple. No thyromegaly present.  Cardiovascular: Normal rate, regular rhythm, normal heart sounds and intact distal pulses.   No murmur heard. Pulses:      Radial pulses are 2+ on the right side, and 2+ on the left side.  Pulmonary/Chest: Effort normal and breath sounds normal. No respiratory distress. She has no wheezes. She has no rales.  Abdominal: Soft. Bowel sounds are normal. She exhibits no distension and no mass. There is no tenderness. There is no rebound and no guarding.  Genitourinary: Vagina normal and uterus normal. Pelvic exam was performed with patient supine. There is no rash, tenderness, lesion or injury on the right labia. There is no rash, tenderness, lesion or injury on the left labia. Uterus is not deviated. Cervix exhibits no motion tenderness, no discharge and no friability. Right adnexum displays no mass, no tenderness and no fullness. Left adnexum displays no mass, no tenderness and no fullness. No erythema, tenderness or bleeding around the vagina. No vaginal discharge found.  Musculoskeletal: Normal range of motion.  Lymphadenopathy:    She has no cervical adenopathy.  Neurological: She is alert and oriented to person, place, and time.       CN grossly intact, station and gait intact  Skin: Skin is warm and dry. No rash noted.  Psychiatric: She has a normal mood and affect. Her behavior is normal. Judgment and thought content normal.      Assessment & Plan:

## 2011-11-19 NOTE — Assessment & Plan Note (Signed)
q yearly mammo.

## 2011-11-23 ENCOUNTER — Encounter: Payer: Self-pay | Admitting: *Deleted

## 2011-12-28 ENCOUNTER — Ambulatory Visit: Payer: Self-pay | Admitting: Internal Medicine

## 2011-12-28 LAB — CBC CANCER CENTER
Basophil #: 0 x10 3/mm (ref 0.0–0.1)
Basophil %: 0.4 %
Eosinophil #: 0.1 x10 3/mm (ref 0.0–0.7)
Eosinophil %: 1.9 %
HCT: 41.9 % (ref 35.0–47.0)
HGB: 14.4 g/dL (ref 12.0–16.0)
Lymphocyte #: 2.6 x10 3/mm (ref 1.0–3.6)
Lymphocyte %: 38.4 %
MCH: 30.6 pg (ref 26.0–34.0)
MCHC: 34.3 g/dL (ref 32.0–36.0)
MCV: 89 fL (ref 80–100)
Monocyte #: 0.7 x10 3/mm (ref 0.0–0.7)
Monocyte %: 10.9 %
Neutrophil #: 3.3 x10 3/mm (ref 1.4–6.5)
Neutrophil %: 48.4 %
Platelet: 268 x10 3/mm (ref 150–440)
RBC: 4.7 10*6/uL (ref 3.80–5.20)
RDW: 14 % (ref 11.5–14.5)
WBC: 6.8 x10 3/mm (ref 3.6–11.0)

## 2011-12-28 LAB — HEPATIC FUNCTION PANEL A (ARMC)
Albumin: 4.2 g/dL (ref 3.4–5.0)
Alkaline Phosphatase: 73 U/L (ref 50–136)
Bilirubin, Direct: 0.1 mg/dL (ref 0.00–0.20)
SGPT (ALT): 28 U/L
Total Protein: 7.7 g/dL (ref 6.4–8.2)

## 2011-12-28 LAB — CREATININE, SERUM
Creatinine: 0.73 mg/dL (ref 0.60–1.30)
EGFR (African American): >60
EGFR (Non-African Amer.): >60

## 2011-12-29 LAB — CANCER ANTIGEN 27.29: CA 27.29: 28 U/mL (ref 0.0–38.6)

## 2011-12-30 ENCOUNTER — Encounter: Payer: Self-pay | Admitting: Family Medicine

## 2012-01-21 ENCOUNTER — Ambulatory Visit: Payer: Self-pay | Admitting: Internal Medicine

## 2012-05-20 ENCOUNTER — Telehealth: Payer: Self-pay | Admitting: *Deleted

## 2012-05-20 ENCOUNTER — Encounter: Payer: Self-pay | Admitting: Family Medicine

## 2012-05-20 ENCOUNTER — Ambulatory Visit (INDEPENDENT_AMBULATORY_CARE_PROVIDER_SITE_OTHER): Payer: PRIVATE HEALTH INSURANCE | Admitting: Family Medicine

## 2012-05-20 VITALS — BP 124/84 | HR 68 | Temp 97.6°F | Wt 188.8 lb

## 2012-05-20 DIAGNOSIS — E039 Hypothyroidism, unspecified: Secondary | ICD-10-CM

## 2012-05-20 DIAGNOSIS — F988 Other specified behavioral and emotional disorders with onset usually occurring in childhood and adolescence: Secondary | ICD-10-CM

## 2012-05-20 LAB — T3, FREE: T3, Free: 3.2 pg/mL (ref 2.3–4.2)

## 2012-05-20 LAB — T4, FREE: Free T4: 0.73 ng/dL (ref 0.60–1.60)

## 2012-05-20 MED ORDER — METHYLPHENIDATE HCL 5 MG PO TABS: 5.0000 mg | ORAL_TABLET | Freq: Two times a day (BID) | ORAL | Status: DC

## 2012-05-20 MED ORDER — ATOMOXETINE HCL 60 MG PO CAPS: 60.0000 mg | ORAL_CAPSULE | Freq: Every day | ORAL | Status: DC

## 2012-05-20 NOTE — Telephone Encounter (Signed)
Patient will need letter of medical necessity for the ritalin as well. She asks that one be mailed to her when completed.

## 2012-05-20 NOTE — Patient Instructions (Addendum)
We will get release for records from Dr. Letta Moynahan at [facility]). Letter provided today for insurance. Return around November for discussion on ADD and we will know by then about strattera and insurance. Low dose ritalin provided for once a day dosing. Blood work today.

## 2012-05-20 NOTE — Assessment & Plan Note (Addendum)
Provided letter for insurance for strattera coverage. Add low dose stimulant, if anxiety worsening, discussed will need referral back to psych. rtc 3-4 mo for f/u. 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

## 2012-05-20 NOTE — Assessment & Plan Note (Signed)
Recheck labs today. 

## 2012-05-20 NOTE — Progress Notes (Signed)
  Subjective:    Patient ID: Susan Soto, female    DOB: 02/05/1966, 46 y.o.   MRN: [id number]  HPI CC: 6 mo f/u for ADD and thyroid check.  Seen here 10/2011 for annual exam, found to have subclinical hypothyroidism. Endorsed hair changes (s/p chemo as well), weight gain, chronic constipation. Endorses heat intolerance, denies cold intolerance. Has actually lost weight - has been trying.  Goes to [employer].  ADD - has been on straterra for last 6 months.  Blase Mess has been working well, however received letter regarding no longer to be covered by insurance.  Would like to decrease straterra to 60mg  daily to see if still able to take.  Was diagnosed here, then sent to psych as stimulants worsened anxiety.  Doesn't think can afford psychiatrist again.  Did have initial battery of tests (with psychology) to assess for ADD.  LMP about 2 wks ago.  Have not been as regular as prior.  Continues to have hot flashes.  Past Medical History  Diagnosis Date  . ADD (attention deficit disorder) 1990s  . Anxiety   . History of breast cancer 2009    Left side, lumpectomy, s/p sentinal LN biopsy, chemo and radiation, remission  . Subclinical hypothyroidism     Review of Systems Per HPI    Objective:   Physical Exam  Nursing note and vitals reviewed. Constitutional: She appears well-developed and well-nourished. No distress.  Neck: No thyromegaly present.  Cardiovascular: Normal rate, regular rhythm, normal heart sounds and intact distal pulses.   No murmur heard. Skin: Skin is warm and dry. No rash noted.  Psychiatric: She has a normal mood and affect.       Assessment & Plan:

## 2012-05-27 NOTE — Telephone Encounter (Signed)
Letter mailed to patient as requested.

## 2012-05-27 NOTE — Telephone Encounter (Signed)
Letter completed and routed to [facility].

## 2012-06-04 ENCOUNTER — Ambulatory Visit: Payer: Self-pay | Admitting: General Surgery

## 2012-06-04 ENCOUNTER — Other Ambulatory Visit: Payer: Self-pay

## 2012-06-04 NOTE — Telephone Encounter (Signed)
Patient notified. She wasn't actually asking for a refill. She was just wanting to confirm if there was a mistake in what she was told vs what was on the bottle and if so, she would run out before she came back for follow up. She said she just started meds on Monday and is only taking 1 daily PRN. She will not need refill for awhile. I advised to give 5 days notice before running out to make sure Rx is ready for pick up. She verbalized understanding.

## 2012-06-04 NOTE — Telephone Encounter (Signed)
Pt called back and is leaving this side of town now and request call back whenever rx ready for pick up.(no rush pt not out of med).

## 2012-06-04 NOTE — Telephone Encounter (Signed)
Actually wrong sig on bottle - I intended for this to be once daily as needed.  If that is the case, wouldn't be due for refill until 06/19/2012.  Would wait until then for refilling.

## 2012-06-04 NOTE — Telephone Encounter (Signed)
Pt said last Ritalin rx written one tab twice a day # 30. Pt understood she was to take one a day per talking with Dr Sharen Hones and pt instructions on AVS; but pt though would write for # 60 since instructions read 1 tab twice a day;so would cover her until she sees Dr Sharen Hones next visit. Pt is on this side of town this morning and request to pick up rx this morning for Ritalin.Please advise.

## 2012-06-30 ENCOUNTER — Other Ambulatory Visit: Payer: Self-pay

## 2012-06-30 ENCOUNTER — Ambulatory Visit: Payer: Self-pay | Admitting: Radiation Oncology

## 2012-06-30 LAB — CBC CANCER CENTER
Basophil #: 0 x10 3/mm (ref 0.0–0.1)
Basophil %: 0.4 %
Eosinophil #: 0.1 x10 3/mm (ref 0.0–0.7)
HGB: 13.8 g/dL (ref 12.0–16.0)
MCH: 29.6 pg (ref 26.0–34.0)
MCHC: 33.1 g/dL (ref 32.0–36.0)
MCV: 89 fL (ref 80–100)
Monocyte #: 0.8 x10 3/mm (ref 0.2–0.9)
Neutrophil #: 3.4 x10 3/mm (ref 1.4–6.5)
Neutrophil %: 49.7 %
Platelet: 246 x10 3/mm (ref 150–440)
RDW: 13.9 % (ref 11.5–14.5)

## 2012-06-30 LAB — HEPATIC FUNCTION PANEL A (ARMC)
Alkaline Phosphatase: 67 U/L (ref 50–136)
SGOT(AST): 17 U/L (ref 15–37)
SGPT (ALT): 27 U/L (ref 12–78)

## 2012-06-30 LAB — CREATININE, SERUM
EGFR (African American): >60
EGFR (Non-African Amer.): >60

## 2012-06-30 MED ORDER — METHYLPHENIDATE HCL 5 MG PO TABS: 5.0000 mg | ORAL_TABLET | Freq: Every day | ORAL | Status: DC | PRN

## 2012-06-30 NOTE — Telephone Encounter (Signed)
Pt request rx Ritalin; call when ready for pick up. Pt is on this side of town today and request to pick up rx 12-12:30 pm today. Pt said she tried to get thru to our office on Fri. Afternoon but could not get phone to ring.Please advise.

## 2012-06-30 NOTE — Telephone Encounter (Signed)
Rx placed up front for pick up. 

## 2012-06-30 NOTE — Telephone Encounter (Signed)
Printed and handed to Kim 

## 2012-07-03 ENCOUNTER — Telehealth: Payer: Self-pay

## 2012-07-03 MED ORDER — ATOMOXETINE HCL 80 MG PO CAPS: 80.0000 mg | ORAL_CAPSULE | Freq: Every day | ORAL | Status: DC

## 2012-07-03 NOTE — Telephone Encounter (Signed)
p left v/m that she had asked Strattera 80 mg reduced to 60 mg because insurance would not pay for 80 mg. Pt has found out insurance approved and will pay for 80 mg. Pt took last 60 mg today and would like Straterra 80 mg sent to [facility] [address].Please advise.

## 2012-07-03 NOTE — Telephone Encounter (Signed)
Sent in

## 2012-07-07 NOTE — Telephone Encounter (Signed)
Message left notifying patient.

## 2012-07-22 ENCOUNTER — Ambulatory Visit: Payer: Self-pay | Admitting: Radiation Oncology

## 2012-08-11 ENCOUNTER — Other Ambulatory Visit: Payer: Self-pay | Admitting: *Deleted

## 2012-08-11 MED ORDER — METHYLPHENIDATE HCL 5 MG PO TABS: 5.0000 mg | ORAL_TABLET | Freq: Every day | ORAL | Status: DC | PRN

## 2012-08-11 NOTE — Telephone Encounter (Signed)
Printed and placed in Kim's box. 

## 2012-08-11 NOTE — Telephone Encounter (Signed)
Patient notified and Rx placed up front for pick up. 

## 2012-08-11 NOTE — Telephone Encounter (Signed)
Pt would like to pick up rx tomorrow am around 9:30 am.

## 2012-08-19 ENCOUNTER — Ambulatory Visit: Payer: PRIVATE HEALTH INSURANCE | Admitting: Family Medicine

## 2012-08-22 ENCOUNTER — Ambulatory Visit: Payer: Self-pay | Admitting: Radiation Oncology

## 2012-09-04 ENCOUNTER — Ambulatory Visit (INDEPENDENT_AMBULATORY_CARE_PROVIDER_SITE_OTHER): Payer: PRIVATE HEALTH INSURANCE | Admitting: Family Medicine

## 2012-09-04 ENCOUNTER — Encounter: Payer: Self-pay | Admitting: Family Medicine

## 2012-09-04 VITALS — BP 118/74 | HR 72 | Temp 98.1°F | Wt 180.2 lb

## 2012-09-04 DIAGNOSIS — F988 Other specified behavioral and emotional disorders with onset usually occurring in childhood and adolescence: Secondary | ICD-10-CM

## 2012-09-04 DIAGNOSIS — F900 Attention-deficit hyperactivity disorder, predominantly inattentive type: Secondary | ICD-10-CM

## 2012-09-04 MED ORDER — METHYLPHENIDATE HCL 10 MG PO TABS: 10.0000 mg | ORAL_TABLET | Freq: Every day | ORAL | Status: DC | PRN

## 2012-09-04 NOTE — Assessment & Plan Note (Signed)
overall doing well. I feel comfortable increasing ritalin to 10mg  daily to use prn.  Continue straterra at 80mg  daily. Planning on establishing with psych next year. Congratulated on weight loss - doubt stimulant related, more due to healthy diet changes.

## 2012-09-04 NOTE — Patient Instructions (Signed)
Good job with weight loss! Let's continue straterra and increase ritalin to 10mg  as needed. Refill provided today.

## 2012-09-04 NOTE — Progress Notes (Signed)
  Subjective:    Patient ID: Susan Soto, female    DOB: 1966-02-12, 46 y.o.   MRN: [id number]  HPI CC: ADD f/u  Susan Soto presents today as 4 mo f/u for ADD, inattentive type.  Prior stimulants caused worsened anxiety.  Had discussed if worsening anxiety would need referral back to psych for further eval/treatment.  Started ritalin 5mg  prn lat visit as well as prior straterra.  States this is not optimal dose.  Has been taking 10mg  at a time because this helps more than 5mg .  Scared to come off straterra as noticed that with decrease to 60mg  symptoms worsened.    strattera causes nausea, but takes with food and does better.  Takes at 1pm.  Takes ritalin after lunch around 2:30pm.  Denies HA, chest pain, insomnia.  Endorses normal appetite.  ADD diagnosed by psychologist.   Found to have subclinical hypothyroidism with sxs, on recheck normal.    Noted weight loss.  Has been on weight watchers for last 10 months.   Wt Readings from Last 3 Encounters:  09/04/12 180 lb 4 oz (81.761 kg)  05/20/12 188 lb 12 oz (85.616 kg)  11/19/11 214 lb 12 oz (97.41 kg)   Daughter will get out of college in May, planning on returning to psychiatrist   Past Medical History  Diagnosis Date  . ADD (attention deficit disorder) 1990s  . Anxiety   . History of breast cancer 2009    Left side, lumpectomy, s/p sentinal LN biopsy, chemo and radiation, remission  . Subclinical hypothyroidism     Review of Systems Per HPI    Objective:   Physical Exam  Nursing note and vitals reviewed. Constitutional: She appears well-developed and well-nourished. No distress.  Cardiovascular: Normal rate, regular rhythm, normal heart sounds and intact distal pulses.   No murmur heard. Pulmonary/Chest: Effort normal and breath sounds normal. No respiratory distress. She has no wheezes. She has no rales.  Musculoskeletal: She exhibits no edema.  Psychiatric: She has a normal mood and affect.       Good eye contact         Assessment & Plan:

## 2012-09-19 ENCOUNTER — Ambulatory Visit: Payer: PRIVATE HEALTH INSURANCE | Admitting: Family Medicine

## 2012-11-10 ENCOUNTER — Telehealth: Payer: Self-pay

## 2012-11-10 MED ORDER — METHYLPHENIDATE HCL ER (LA) 10 MG PO CP24: 10.0000 mg | ORAL_CAPSULE | ORAL | Status: DC

## 2012-11-10 NOTE — Telephone Encounter (Signed)
May try this - printed 10mg  extended release form and placed in Kim's box.

## 2012-11-10 NOTE — Telephone Encounter (Signed)
Patient notified and Rx placed up front for pick up. 

## 2012-11-10 NOTE — Telephone Encounter (Signed)
Pt left v/m requesting Ritalin changed to extended release. Pt said Ritalin taking now is not lasting. Pt would like to pick up rx this afternoon if possible.Please advise.

## 2012-11-19 ENCOUNTER — Telehealth: Payer: Self-pay | Admitting: *Deleted

## 2012-11-19 NOTE — Telephone Encounter (Signed)
Received prior auth request for pt's Ritalin, pending faxed paperwork.

## 2012-11-19 NOTE — Telephone Encounter (Signed)
Approval letter received for ritalin; [facility]ed to [facility] and placed on Dr Timoteo Expose in box for signature and scanning.

## 2012-12-20 ENCOUNTER — Ambulatory Visit: Payer: Self-pay | Admitting: Internal Medicine

## 2013-01-20 ENCOUNTER — Ambulatory Visit: Payer: Self-pay | Admitting: Internal Medicine

## 2013-01-20 LAB — CBC CANCER CENTER
Basophil #: 0 x10 3/mm (ref 0.0–0.1)
Basophil %: 0.8 %
Eosinophil #: 0.1 x10 3/mm (ref 0.0–0.7)
Eosinophil %: 1.7 %
HCT: 39.8 % (ref 35.0–47.0)
HGB: 13.4 g/dL (ref 12.0–16.0)
Lymphocyte %: 40.7 %
MCH: 29.9 pg (ref 26.0–34.0)
MCV: 89 fL (ref 80–100)
Monocyte %: 11 %
Neutrophil %: 45.8 %
Platelet: 260 x10 3/mm (ref 150–440)
RBC: 4.49 10*6/uL (ref 3.80–5.20)

## 2013-01-20 LAB — CREATININE, SERUM
Creatinine: 0.84 mg/dL (ref 0.60–1.30)
EGFR (African American): >60
EGFR (Non-African Amer.): >60

## 2013-01-20 LAB — HEPATIC FUNCTION PANEL A (ARMC)
Alkaline Phosphatase: 62 U/L (ref 50–136)
Bilirubin,Total: 0.3 mg/dL (ref 0.2–1.0)

## 2013-01-21 LAB — CANCER ANTIGEN 27.29: CA 27.29: 22 U/mL (ref 0.0–38.6)

## 2013-02-11 ENCOUNTER — Telehealth: Payer: Self-pay

## 2013-02-11 MED ORDER — ATOMOXETINE HCL 60 MG PO CAPS: 60.0000 mg | ORAL_CAPSULE | Freq: Every day | ORAL | Status: DC

## 2013-02-11 MED ORDER — METHYLPHENIDATE HCL 5 MG PO TABS: 5.0000 mg | ORAL_TABLET | Freq: Two times a day (BID) | ORAL | Status: DC

## 2013-02-11 NOTE — Telephone Encounter (Signed)
plz notify Blase Mess has been decreased to 60mg .  If persistent sxs, would recommend decrease to 40mg  daily. I have also changed ritalin from 24 hour tablet to immediate release - bid dosing.  printed and placed in Kim's box.

## 2013-02-11 NOTE — Telephone Encounter (Signed)
Pt request strattera 80 mg dosage be decreased; pt has lost 55 lbs in last year; pt feeling nauseated after taking Strattera(pt does not take on empty stomach). Pt request changing Ritalin 24 hr tab due to pt cost being $ 100.00 to another generic med that is 10 mg or a generic med that is not 24 hr tab. Pt will be out of med before 03/04/13 f/u appt. Pt request call back when rx ready. Pt would like to pick up on 02/13/13.

## 2013-02-12 NOTE — Telephone Encounter (Signed)
Patient notified and Rx placed up front for up. 

## 2013-02-19 ENCOUNTER — Ambulatory Visit: Payer: Self-pay | Admitting: Internal Medicine

## 2013-03-04 ENCOUNTER — Telehealth: Payer: Self-pay | Admitting: *Deleted

## 2013-03-04 ENCOUNTER — Encounter: Payer: Self-pay | Admitting: Family Medicine

## 2013-03-04 ENCOUNTER — Ambulatory Visit (INDEPENDENT_AMBULATORY_CARE_PROVIDER_SITE_OTHER): Payer: PRIVATE HEALTH INSURANCE | Admitting: Family Medicine

## 2013-03-04 VITALS — BP 122/74 | HR 76 | Temp 97.5°F | Wt 159.5 lb

## 2013-03-04 DIAGNOSIS — F411 Generalized anxiety disorder: Secondary | ICD-10-CM

## 2013-03-04 DIAGNOSIS — F988 Other specified behavioral and emotional disorders with onset usually occurring in childhood and adolescence: Secondary | ICD-10-CM

## 2013-03-04 DIAGNOSIS — N951 Menopausal and female climacteric states: Secondary | ICD-10-CM

## 2013-03-04 DIAGNOSIS — Z780 Asymptomatic menopausal state: Secondary | ICD-10-CM | POA: Insufficient documentation

## 2013-03-04 DIAGNOSIS — E039 Hypothyroidism, unspecified: Secondary | ICD-10-CM

## 2013-03-04 DIAGNOSIS — F900 Attention-deficit hyperactivity disorder, predominantly inattentive type: Secondary | ICD-10-CM

## 2013-03-04 DIAGNOSIS — C50919 Malignant neoplasm of unspecified site of unspecified female breast: Secondary | ICD-10-CM

## 2013-03-04 MED ORDER — METHYLPHENIDATE HCL ER (CD) 10 MG PO CPCR: 10.0000 mg | ORAL_CAPSULE | ORAL | Status: DC

## 2013-03-04 NOTE — Patient Instructions (Signed)
Good to see you, call us with questions. Try extended release generic and let me know how this does. Return at your convenience fasting for blood work and aftewards for physical.

## 2013-03-04 NOTE — Progress Notes (Signed)
  Subjective:    Patient ID: Susan Soto, female    DOB: 1966/06/28, 47 y.o.   MRN: [id number]  HPI CC: 6 mo f/u  Shanquita presents today as 6 mo f/u for ADD, inattentive type. Prior stimulants caused worsened anxiety.  Recently changed ritalin LA 10mg  daily to methylphenidate 5mg  bid (due to insurance coverage) but pt notes that with immediate release wears off very quickly and increased irritability.  Asks about metadate CD generic which per pharmacist should be affordable.  Denies headaches, vision changes, chest pain, dizziness, shortness of breath, appetite changes, or insomnia.  Wt Readings from Last 3 Encounters:  03/04/13 159 lb 8 oz (72.349 kg)  09/04/12 180 lb 4 oz (81.761 kg)  05/20/12 188 lb 12 oz (85.616 kg)  on weight watchers, losing weight as expected.  Goal is 145lbs.  Also straterra was decreased 2/2 recent weight loss because higher dose was causing nausea.  Has not set up with psychiatrist, but planning on calling this year to establish.  Hot flashes - not interested in antidepressants and avoids estrogen like products 2/2 h/o breast cancer. LMP 6-8 mo ago  Past Medical History  Diagnosis Date  . ADD (attention deficit disorder) 1990s  . Anxiety   . History of breast cancer 2009    Left side, lumpectomy, s/p sentinal LN biopsy, chemo and radiation, remission  . Subclinical hypothyroidism     Past Surgical History  Procedure Laterality Date  . Bartholin's cystectomy    . Episiotomy    . Cystectomy      of sutures from episiotomy  . Breast biopsy  05/07/08    Left (infitr ductal CA)- Dr. Lemar Livings  . Breast wide excision  05/27/08    Left-with sentinel node bx (Dr. Lemar Livings)    Review of Systems Per HPI    Objective:   Physical Exam  Nursing note and vitals reviewed. Constitutional: She appears well-developed and well-nourished. No distress.  HENT:  Head: Normocephalic and atraumatic.  Mouth/Throat: Oropharynx is clear and moist. No oropharyngeal exudate.   Eyes: Conjunctivae and EOM are normal. Pupils are equal, round, and reactive to light. No scleral icterus.  Neck: Normal range of motion. Neck supple. No thyromegaly present.  Cardiovascular: Normal rate, regular rhythm, normal heart sounds and intact distal pulses.   No murmur heard. Pulmonary/Chest: Effort normal and breath sounds normal. No respiratory distress. She has no wheezes. She has no rales.  Musculoskeletal: She exhibits no edema.  Lymphadenopathy:    She has no cervical adenopathy.  Psychiatric: She has a normal mood and affect. Her behavior is normal. Judgment and thought content normal.       Assessment & Plan:

## 2013-03-04 NOTE — Assessment & Plan Note (Signed)
With hot flashes and vaginal dryness. Avoiding any hormonal therapies given hx breast cancer. Discussed SSRIs, pt declines currently. Using vag lubricant. Check TSH when returns for physical.

## 2013-03-04 NOTE — Telephone Encounter (Signed)
Filled out and placed in kim's box.

## 2013-03-04 NOTE — Assessment & Plan Note (Signed)
Will return fasting for blood work and will check TSH at that time.

## 2013-03-04 NOTE — Telephone Encounter (Signed)
Per the front desk-Patient would like fasting lab orders mailed to her so she can have them drawn at [facility]. Please write script. See in basket message.

## 2013-03-04 NOTE — Assessment & Plan Note (Signed)
Planning establishing with psych.

## 2013-03-04 NOTE — Assessment & Plan Note (Signed)
Pt desires trial of extended release methylphenidate - will prescribe this.  Some side effects to IR formulation Doing better on straterra 60mg  daily as well. rtc 6 mo for f/u or sooner if needed. Encouraged her to establish with psych given concurrent use of straterra and stimulant.

## 2013-03-04 NOTE — Assessment & Plan Note (Signed)
About to be 5 years in remission.

## 2013-03-05 NOTE — Telephone Encounter (Signed)
Mailed to patient and reminded her that we are a draw station for [facility] and she needed to have her labs drawn here due to liability issues should she have critical results that we did not receive.

## 2013-03-08 ENCOUNTER — Other Ambulatory Visit: Payer: Self-pay | Admitting: Family Medicine

## 2013-03-08 DIAGNOSIS — E039 Hypothyroidism, unspecified: Secondary | ICD-10-CM

## 2013-03-08 DIAGNOSIS — Z0000 Encounter for general adult medical examination without abnormal findings: Secondary | ICD-10-CM

## 2013-03-08 DIAGNOSIS — E038 Other specified hypothyroidism: Secondary | ICD-10-CM

## 2013-03-19 ENCOUNTER — Telehealth: Payer: Self-pay

## 2013-03-19 NOTE — Telephone Encounter (Signed)
Received PA from [employer] spoke with Albin Felling at [facility] and rx not go thru for methylphenidate; have a prior auth letter thru 2024. Spoke with Eunice Blase at [place] [phone] and [facility] use new [facility] # [id number]. Spoke with Tom at [facility] and rx went thru.

## 2013-04-21 ENCOUNTER — Other Ambulatory Visit: Payer: PRIVATE HEALTH INSURANCE

## 2013-05-07 ENCOUNTER — Encounter: Payer: Self-pay | Admitting: Family Medicine

## 2013-05-08 ENCOUNTER — Encounter: Payer: Self-pay | Admitting: *Deleted

## 2013-05-26 ENCOUNTER — Ambulatory Visit (INDEPENDENT_AMBULATORY_CARE_PROVIDER_SITE_OTHER): Payer: PRIVATE HEALTH INSURANCE | Admitting: Family Medicine

## 2013-05-26 ENCOUNTER — Encounter: Payer: Self-pay | Admitting: Family Medicine

## 2013-05-26 ENCOUNTER — Other Ambulatory Visit (HOSPITAL_COMMUNITY)
Admission: RE | Admit: 2013-05-26 | Discharge: 2013-05-26 | Disposition: A | Discharge status: Home / Self Care | Payer: PRIVATE HEALTH INSURANCE | Source: Ambulatory Visit | Attending: Family Medicine | Admitting: Family Medicine

## 2013-05-26 VITALS — BP 110/76 | HR 68 | Temp 97.8°F | Ht 65.5 in | Wt 156.5 lb

## 2013-05-26 DIAGNOSIS — E039 Hypothyroidism, unspecified: Secondary | ICD-10-CM

## 2013-05-26 DIAGNOSIS — Z1151 Encounter for screening for human papillomavirus (HPV): Secondary | ICD-10-CM | POA: Insufficient documentation

## 2013-05-26 DIAGNOSIS — R32 Unspecified urinary incontinence: Secondary | ICD-10-CM | POA: Insufficient documentation

## 2013-05-26 DIAGNOSIS — F900 Attention-deficit hyperactivity disorder, predominantly inattentive type: Secondary | ICD-10-CM

## 2013-05-26 DIAGNOSIS — N951 Menopausal and female climacteric states: Secondary | ICD-10-CM

## 2013-05-26 DIAGNOSIS — E038 Other specified hypothyroidism: Secondary | ICD-10-CM

## 2013-05-26 DIAGNOSIS — Z01419 Encounter for gynecological examination (general) (routine) without abnormal findings: Secondary | ICD-10-CM | POA: Insufficient documentation

## 2013-05-26 DIAGNOSIS — N3946 Mixed incontinence: Secondary | ICD-10-CM | POA: Insufficient documentation

## 2013-05-26 DIAGNOSIS — C50919 Malignant neoplasm of unspecified site of unspecified female breast: Secondary | ICD-10-CM

## 2013-05-26 DIAGNOSIS — F988 Other specified behavioral and emotional disorders with onset usually occurring in childhood and adolescence: Secondary | ICD-10-CM

## 2013-05-26 DIAGNOSIS — R3915 Urgency of urination: Secondary | ICD-10-CM

## 2013-05-26 DIAGNOSIS — Z0000 Encounter for general adult medical examination without abnormal findings: Secondary | ICD-10-CM

## 2013-05-26 MED ORDER — METHYLPHENIDATE HCL 5 MG PO TABS: 5.0000 mg | ORAL_TABLET | Freq: Two times a day (BID) | ORAL | Status: DC

## 2013-05-26 NOTE — Assessment & Plan Note (Signed)
Menopause at age 47

## 2013-05-26 NOTE — Assessment & Plan Note (Signed)
Will change to IR formulation ritalin 5mg  bid 2/2 cost concerns. Continue straterra.

## 2013-05-26 NOTE — Addendum Note (Signed)
Addended by: Josph Macho A on: 05/26/2013 09:30 AM   Modules accepted: Orders

## 2013-05-26 NOTE — Patient Instructions (Addendum)
For urine - keep urine diary to see if any improvement over next month with implementing strategies discussed in handout today.  Return to see me in 1 month for follow up urination. I will await records from occupational health.

## 2013-05-26 NOTE — Assessment & Plan Note (Signed)
Anticipate urge incontinence sxs from story. Await records from occupational health eval including UCx. I've asked her to keep bladder diary and return in 1 mo for f/u.

## 2013-05-26 NOTE — Assessment & Plan Note (Signed)
Continue yearly mammograms 

## 2013-05-26 NOTE — Assessment & Plan Note (Signed)
Preventative protocols reviewed and updated unless pt declined. Discussed healthy diet and lifestyle.  Breast exam, pelvic exam with pap performed today.  Pap requested yearly. H/o breast cancer - gets yearly mammograms in August.

## 2013-05-26 NOTE — Assessment & Plan Note (Signed)
TSH stable. Continue to monitor   Lab Results  Component Value Date   TSH 3.25 05/20/2012

## 2013-05-26 NOTE — Progress Notes (Signed)
Subjective:    Patient ID: Susan Soto, female    DOB: November 28, 1965, 47 y.o.   MRN: [id number]  HPI CC: CPE  Susan Soto presents today for annual exam.  Had FLP and A1c done at work.  A1c was 5.7%, FLP normal. Wt Readings from Last 3 Encounters:  05/26/13 156 lb 8 oz (70.988 kg)  03/04/13 159 lb 8 oz (72.349 kg)  09/04/12 180 lb 4 oz (81.761 kg)    ADD _ requests change to generic ($10/mo vs $100/mo)  Having urinary urge issues for last 3 months.  Yesterday saw occupational health with persistent UTI sxs - urgency, frequency, incomplete emptying. No dysuria, fevers/chills, abd pain, back pain, n/v. Becoming irritating at work - due to frequency.  I will await records from occupational health (UCx obtained and will be faxed to me).  Occasional stress incontinence issues.  Nocturia x1-2.  Foot on floor, key in door phenomenon.  Large cup coffee/day.  Drinking several bottles of water/day.  Never smoker.  + second hand smoke growing up.  H/o breast cancer 2009 - L sided s/p lumpectomy and sentinel LN biopsy, in remission.  Religious about yearly mammogram.  Goes to [facility]  Preventative: Well woman here. Pap smear normal 10/2011.  Mother with h/o cervical cancer - pt would like yearly pap smears. Mammogram yearly - upcoming 05/2013 scheduled  Discussed calcium/vit D intake. Flu at work. Tdap at work (around 2013).  Seat belt 100% time.  Sunscreen use discussed.  No sunburns in last year.  LMP over 1 year ago.   Hot flashes - not interested in antidepressants and avoids estrogen like products 2/2 h/o breast cancer.  They have gotten better. Hair falling out. Worsening ADD.   Some dyspareunia.  Caffeine: 1 cup coffee in am Lives with husband and 1 son, daughter in college, 1 dog and 1 cat, 5-6 outside cats Occupation: Writer job Activity: no regular activity Diet: joined [employer], good water, fruits/vegetables daily, red meat 3x/wk, fish  2x/wk  Medications and allergies reviewed and updated in chart.  Past histories reviewed and updated if relevant as below. Patient Active Problem List   Diagnosis Date Noted  . Perimenopausal 03/04/2013  . Healthcare maintenance 11/19/2011  . Subclinical hypothyroidism 08/16/2009  . CARCINOMA, INFILTRATING DUCTAL, LEFT BREAST 05/07/2008  . HEMORRHOIDS, INTERNAL, WITH BLEEDING 04/19/2008  . Anxiety state, unspecified 01/13/2008  . URTICARIA, HX OF (RECURRENT) 12/31/2007  . ADD (attention deficit hyperactivity disorder, inattentive type) 12/26/2007   Past Medical History  Diagnosis Date  . ADD (attention deficit disorder) 1990s  . Anxiety   . History of breast cancer 2009    Left side, lumpectomy, s/p sentinal LN biopsy, chemo and radiation, remission  . Subclinical hypothyroidism    Past Surgical History  Procedure Laterality Date  . Bartholin's cystectomy    . Episiotomy    . Cystectomy      of sutures from episiotomy  . Breast biopsy  05/07/08    Left (infitr ductal CA)- Dr. Lemar Livings  . Breast wide excision  05/27/08    Left-with sentinel node bx (Dr. Lemar Livings)   History  Substance Use Topics  . Smoking status: Never Smoker   . Smokeless tobacco: Never Used  . Alcohol Use: No   Family History  Problem Relation Age of Onset  . Drug abuse Father     Heroin  . Hypertension Mother   . Fibromyalgia Mother   . ADD / ADHD Mother   . Depression  Mother   . Cancer Mother     cervical  . ADD / ADHD Brother   . Anxiety disorder Brother   . Stroke Maternal Grandmother   . Cancer Maternal Grandmother     breast  . Hypertension Paternal Grandmother   . Cancer Paternal Grandfather     lung  . Diabetes Neg Hx   . Coronary artery disease Neg Hx    Allergies  Allergen Reactions  . Bupropion Hcl     REACTION: Hives  . Buspar (Buspirone Hcl) Nausea And Vomiting   Current Outpatient Prescriptions on File Prior to Visit  Medication Sig Dispense Refill  . atomoxetine  (STRATTERA) 60 MG capsule Take 1 capsule (60 mg total) by mouth daily.  30 capsule  6  . methylphenidate (METADATE CD) 10 MG CR capsule Take 1 capsule (10 mg total) by mouth every morning.  30 capsule  0  . Nutritional Supplements (JUICE PLUS FIBRE) LIQD Take by mouth daily.        . Omega-3 Fatty Acids (RA FISH OIL) 900 MG CAPS Take 1 capsule by mouth daily.         No current facility-administered medications on file prior to visit.    Review of Systems  Constitutional: Negative for fever, chills, activity change, appetite change, fatigue and unexpected weight change.  HENT: Negative for hearing loss and neck pain.   Eyes: Negative for visual disturbance.  Respiratory: Negative for cough, chest tightness, shortness of breath and wheezing.   Cardiovascular: Negative for chest pain, palpitations and leg swelling.  Gastrointestinal: Negative for nausea, vomiting, abdominal pain, diarrhea, constipation, blood in stool and abdominal distention.  Genitourinary: Positive for urgency, frequency and difficulty urinating. Negative for hematuria.  Musculoskeletal: Negative for myalgias and arthralgias.  Skin: Negative for rash.  Neurological: Negative for dizziness, seizures, syncope and headaches.  Hematological: Negative for adenopathy. Does not bruise/bleed easily.  Psychiatric/Behavioral: Negative for dysphoric mood. The patient is not nervous/anxious.        Objective:   Physical Exam  Nursing note and vitals reviewed. Constitutional: She is oriented to person, place, and time. She appears well-developed and well-nourished. No distress.  HENT:  Head: Normocephalic and atraumatic.  Right Ear: Hearing, tympanic membrane, external ear and ear canal normal.  Left Ear: Hearing, tympanic membrane, external ear and ear canal normal.  Mouth/Throat: Oropharynx is clear and moist. No oropharyngeal exudate.  Eyes: Conjunctivae and EOM are normal. Pupils are equal, round, and reactive to light. No  scleral icterus.  Neck: Normal range of motion. Neck supple. No thyromegaly present.  Cardiovascular: Normal rate, regular rhythm, normal heart sounds and intact distal pulses.   No murmur heard. Pulses:      Radial pulses are 2+ on the right side, and 2+ on the left side.  Pulmonary/Chest: Effort normal and breath sounds normal. No respiratory distress. She has no wheezes. She has no rales. Right breast exhibits no inverted nipple, no mass, no nipple discharge, no skin change and no tenderness. Left breast exhibits no inverted nipple, no mass, no nipple discharge, no skin change and no tenderness. Breasts are symmetrical.  Abdominal: Soft. Bowel sounds are normal. She exhibits no distension and no mass. There is no tenderness. There is no rebound and no guarding.  Genitourinary: Vagina normal and uterus normal. Pelvic exam was performed with patient supine. There is no rash, tenderness, lesion or injury on the right labia. There is no rash, tenderness, lesion or injury on the left labia. Cervix exhibits  no motion tenderness, no discharge and no friability. Right adnexum displays no mass, no tenderness and no fullness. Left adnexum displays no mass, no tenderness and no fullness.  Musculoskeletal: Normal range of motion. She exhibits no edema.  Lymphadenopathy:    She has no cervical adenopathy.    She has no axillary adenopathy.       Right axillary: No lateral adenopathy present.       Left axillary: No lateral adenopathy present.      Right: No supraclavicular adenopathy present.       Left: No supraclavicular adenopathy present.  Neurological: She is alert and oriented to person, place, and time.  CN grossly intact, station and gait intact  Skin: Skin is warm and dry. No rash noted.  Psychiatric: She has a normal mood and affect. Her behavior is normal. Judgment and thought content normal.       Assessment & Plan:

## 2013-05-29 ENCOUNTER — Encounter: Payer: Self-pay | Admitting: *Deleted

## 2013-06-08 ENCOUNTER — Ambulatory Visit: Payer: Self-pay | Admitting: Internal Medicine

## 2013-06-11 ENCOUNTER — Ambulatory Visit (INDEPENDENT_AMBULATORY_CARE_PROVIDER_SITE_OTHER): Payer: No Typology Code available for payment source | Admitting: General Surgery

## 2013-06-11 ENCOUNTER — Encounter: Payer: Self-pay | Admitting: General Surgery

## 2013-06-11 VITALS — BP 112/72 | HR 82 | Resp 12 | Ht 65.5 in | Wt 155.0 lb

## 2013-06-11 DIAGNOSIS — R928 Other abnormal and inconclusive findings on diagnostic imaging of breast: Secondary | ICD-10-CM

## 2013-06-11 DIAGNOSIS — Z853 Personal history of malignant neoplasm of breast: Secondary | ICD-10-CM

## 2013-06-11 NOTE — Patient Instructions (Addendum)
Patient to be scheduled for a left breast stereotactic breast biopsy.    Breast Biopsy, Stereotactic A stereotactic breast biopsy takes a tissue sample from the breast with a special instrument. This is done when:  The problem (lump, abnormality, mass) can be seen on X-ray, but not felt on physical exam.  Suspicious, small calcium deposits (calcifications) are seen in the breast.  There is a change in shape or appearance of the breast, thickening, or asymmetry on mammogram (breast X-ray).  You have nipple changes (unusual or bloody discharge, crusting, retraction, dimpling).  Your caregiver is making a surgical diagnosis. The biopsy may be done on a special table, with your face down and your breasts placed through openings in the table. Computerized imaging (special form of X-rays) is used. The images are not obtained using regular X-ray film. So, exposure to radiation is reduced. Images are seen through several different angles. The surgeon removes small pieces of the suspicious tissue through a hollow needle. The tissue will be sent to the lab for analysis. The surgeon can look at the pictures right away, rather than wait for an X-ray to be developed. Your caregiver can mark the lesions (abnormal tissue formations) electronically. Then the computer can tell exactly where the problem is, or if it has moved. BENEFITS OF THE PROCEDURE  This is a good way to see if tiny lumps, abnormal looking tissue, or calcium deposits that you cannot feel are cancerous or require further treatment or follow-up.  Needle biopsy is a simple procedure. It may be performed in an outpatient imaging center. This means you have the procedure and go home the same day, without checking into a hospital.  It is less painful than open surgery. The results are as accurate as when a tissue sample is removed surgically.  The procedure is faster, less expensive, less invasive, does not distort the breast, and leaves little  or no scar.  Breast defects, which can make future mammograms hard to read and interpret, do not remain.  Recovery time is brief. Patients can soon resume their normal activities.  Using VAD (vacuum assisted device) may make it possible to remove entire lesions.  A breast biopsy can indicate if you need surgery, other treatment, or combined treatment. LET YOUR CAREGIVER KNOW ABOUT:  Allergies.  Medications taken, including herbs, eye drops, over-the-counter medications, and creams.  Use of steroids (by mouth or creams).  Previous problems with anesthetics or numbing medication.  If you are taking blood thinner medications or aspirin.  Possibility of pregnancy, if this applies.  History of blood clots (thrombophlebitis).  History of bleeding or blood problems.  Previous surgery.  Other health problems. RISKS AND COMPLICATIONS  Infection (germ growing in the wound). This can often be treated with antibiotics.  Bleeding, following surgery. Your surgeon takes every precaution to keep this from happening.  There is some concern that if a cancerous mass is present, cancer cells might be spread by the needle. Whether this actually happens is not known. It does not appear to be a significant risk.  X-ray guided breast biopsy is not infallible (not always correct). The problem may be missed or the extent of the problem may be underestimated. This would mean the biopsy did not manage to remove a piece of the diseased tissue or enough of the diseased tissue.  Lesions present, with calcium deposits scattered throughout the breast, are difficult to target by stereotactic method. Those lesions near the chest wall also are hard to learn about  by this method. If the mammogram shows only a vague change in tissue density, but no definite mass or nodule, the X-ray guided method may not be successful. Occasionally, even after a successful biopsy, the tissue diagnosis remains uncertain. A surgical  biopsy will be needed, if abnormal or precancerous cells are found on core biopsy.  Altering or deforming of the breast.  Unable to find, or missing the lesion.  Rarely, the needle may go through the chest wall into the lung area. TWO BIOPSY INSTRUMENTS MAY BE USED IN THE PROCEDURE The conventional biopsy device (core needle biopsy device) consists of an inner needle with a trough extending from it at one end, and an overlying sheath. It is attached to a spring-loaded mechanism that propels it forward. The trough fills with tissue. The outer sheath instantly moves forward to cut the tissue and keep it in the trough. Each sample is obtained in a fraction of a second. It is necessary to withdraw the needle after each sample is taken to collect the tissue.  A newer type of instrument, the VAD (vacuum assisted device), uses vacuum pressure to pull breast tissue into a needle and remove it. The needle does not need to be withdrawn after each sampling. Another advantage is that biopsies are obtained in an orderly manner, by rotating the device. This helps make sure that the entire area of interest will be sampled. When using the automated core biopsy needle, sampling is more random.  FOR COMFORT DURING THE TEST  Relax as much as possible.  Try to follow instructions, to speed up the test.  Let your caregiver know if you are uncomfortable, anxious, or in pain. PROCEDURE  You are awake during the procedure, and you go home the same day (outpatient). A specially trained radiologist will do this procedure. First, the skin is cleansed. Then, it is injected with a local anesthetic. A small nick is made in the skin, and the tip of the biopsy needle is put into the calculated site of the lesion. A special mammography machine uses ionizing radiation to help guide the radiologist's instrument to the site of the abnormal growth. At this point, stereo images are again obtained, to confirm that the needle tip is at  the problem area. Usually 5 to 10 samples are collected when doing a core biopsy. At least 12 are collected when using the vacuum assisted device (VAD). Then, a final set of images is obtained. If they show that the lesion has been mostly or completely removed, a small clip is left at the biopsy site. This is so that it can be easily located, in case the lesion turns out to be cancer. Afterward, the skin opening is stitched (sutured) or taped closed, and covered with a dressing. Your caregiver may apply a pressure dressing and an ice pack, to prevent bleeding and swelling in the breast.  X-ray guided breast biopsy can take 30 minutes to 1 hour, or more. The X-rays usually have no side effects, and no radiation remains in your body. There is usually little or no pain. Usually no scar is left from the tiny skin incision. Many women find that the major discomfort of the procedure is from lying on their stomach, or staying in 1 position for the length of the procedure. This discomfort may be reduced by carefully placed cushions. You should wear a good support bra to the procedure. You will be asked to remove jewelry, dentures, eye glasses, metal objects, or clothing that might  interfere with the X-ray images. You may want to have someone with you, to take you home after the procedure. AFTER THE PROCEDURE   After surgery, if you are doing well and have no problems, you will be allowed to go home.  You may resume your regular diet, or as directed by your caregiver. HOME CARE INSTRUCTIONS   Follow your caregiver's recommendations for medications, care of the biopsy site, follow-up appointments, and further treatment.  Only take over-the-counter or prescription medicines for pain, discomfort, or fever as directed by your caregiver.  An ice pack applied to the affected area may help with discomfort and keep the swelling down.  Change dressings as directed.  Wear a good support bra for as long as your  caregiver recommends.  Avoid strenuous activity for at least 24 hours, or as advised by your caregiver. Finding out the results of your test Not all test results are available during your visit. If your test results are not back during the visit, make an appointment with your caregiver to find out the results. Do not assume everything is normal if you have not heard from your caregiver or the medical facility. It is important for you to follow up on all of your test results.  SEEK MEDICAL CARE IF:   You develop a rash.  You have problems with your medicines.  You become lightheaded or dizzy. SEEK IMMEDIATE MEDICAL CARE IF:   There is increased bleeding (more than a small spot) from the biopsy site.  You notice redness, swelling, or increasing pain in the wound.  Pus is coming from the wound.  You have a fever.  You notice a bad smell coming from the wound or dressing.  You develop shortness of breath.  You develop chest pain.  You pass out. Document Released: 07/07/2003 Document Revised: 12/31/2011 Document Reviewed: 08/12/2009 [facility] Patient Information 2014 [place], [place].  Patient has been scheduled for a left breast stereotactic biopsy at [facility] for 06-25-13 at 2 pm. She will check-in at the [facility] at 1:30 pm. This patient is aware of date, time, and instructions. Patient verbalizes understanding.

## 2013-06-11 NOTE — Progress Notes (Signed)
Patient ID: Susan Soto, female   DOB: 10-Jan-1966, 47 y.o.   MRN: [id number]  Chief Complaint  Patient presents with  . Follow-up    mammogram    HPI Susan Soto is a 47 y.o. female.  who presents for a breast evaluation. The most recent mammogram was done on 06-08-13. Patient does not perform regular self breast checks but does gets regular mammograms done. The patient has a family history as well as a personal history of breast cancer. She denies any new problems with the breasts at this time.   HPI  Past Medical History  Diagnosis Date  . ADD (attention deficit disorder) 1990s  . Anxiety   . History of breast cancer 2009    Left side, lumpectomy, s/p sentinal LN biopsy, chemo and radiation, remission  . Subclinical hypothyroidism   . Cancer 2009    breast  . Hemorrhoids     Past Surgical History  Procedure Laterality Date  . Bartholin's cystectomy    . Episiotomy    . Cystectomy      of sutures from episiotomy  . Breast biopsy  05/07/08    Left (infitr ductal CA)- Dr. Lemar Livings  . Breast wide excision  05/27/08    Left-with sentinel node bx (Dr. Lemar Livings)    Family History  Problem Relation Age of Onset  . Drug abuse Father     Heroin  . Hypertension Mother   . Fibromyalgia Mother   . ADD / ADHD Mother   . Depression Mother   . Cancer Mother     cervical  . ADD / ADHD Brother   . Anxiety disorder Brother   . Stroke Maternal Grandmother   . Cancer Maternal Grandmother     breast  . Hypertension Paternal Grandmother   . Cancer Paternal Grandfather     lung  . Diabetes Neg Hx   . Coronary artery disease Neg Hx     Social History History  Substance Use Topics  . Smoking status: Never Smoker   . Smokeless tobacco: Never Used  . Alcohol Use: No    Allergies  Allergen Reactions  . Bupropion Hcl     REACTION: Hives  . Buspar [Buspirone Hcl] Nausea And Vomiting  . Other Other (See Comments)    Dissolving sutures  . Latex Rash    Band aids    Current  Outpatient Prescriptions  Medication Sig Dispense Refill  . atomoxetine (STRATTERA) 60 MG capsule Take 1 capsule (60 mg total) by mouth daily.  30 capsule  6  . methylphenidate (RITALIN) 5 MG tablet Take 1 tablet (5 mg total) by mouth 2 (two) times daily.  60 tablet  0  . Multiple Vitamins-Minerals (HAIR/SKIN/NAILS/BIOTIN PO) Take 1 tablet by mouth daily.      . Nutritional Supplements (JUICE PLUS FIBRE) LIQD Take by mouth daily.        . Omega-3 Fatty Acids (RA FISH OIL) 900 MG CAPS Take 1 capsule by mouth daily.         No current facility-administered medications for this visit.    Review of Systems Review of Systems  Constitutional: Negative.   Respiratory: Negative.   Cardiovascular: Negative.     Blood pressure 112/72, pulse 82, resp. rate 12, height 5' 5.5" (1.664 m), weight 155 lb (70.308 kg), last menstrual period 05/26/2012.  Physical Exam Physical Exam  Constitutional: She is oriented to person, place, and time. She appears well-developed and well-nourished.  Neck: No thyromegaly present.  Cardiovascular: Normal rate,  regular rhythm and normal heart sounds.   No murmur heard. Pulmonary/Chest: Effort normal and breath sounds normal. Right breast exhibits no inverted nipple, no mass, no nipple discharge, no skin change and no tenderness. Left breast exhibits no inverted nipple, no mass, no nipple discharge, no skin change and no tenderness.  Left breast well healed scar at upper inner quadrant.    Lymphadenopathy:    She has no cervical adenopathy.    She has no axillary adenopathy.  Neurological: She is alert and oriented to person, place, and time.  Skin: Skin is warm and dry.  1 cm sebaceous cyst on right upper outer thigh.    Data Reviewed Bilateral mammograms it coursed 18, 2014 were reviewed. An open pleomorphic and linear calcifications were reported to the lumpectomy site. The right breast was unremarkable. BI-RAD 4.  Assessment    Likely progressive ischemic  changes from previous surgery and radiation. Low suspicion for malignancy.     Plan    Indications for biopsy has been reviewed with the patient and her husband.     Patient has been scheduled for a left breast stereotactic biopsy at [facility] for 06-25-13 at 2 pm. She will check-in at the [facility] at 1:30 pm. This patient is aware of date, time, and instructions. Patient verbalizes understanding.   Earline Mayotte 06/12/2013, 7:51 PM

## 2013-06-12 ENCOUNTER — Encounter: Payer: Self-pay | Admitting: General Surgery

## 2013-06-25 ENCOUNTER — Ambulatory Visit: Payer: Self-pay | Admitting: General Surgery

## 2013-06-25 DIAGNOSIS — R920 Mammographic microcalcification found on diagnostic imaging of breast: Secondary | ICD-10-CM

## 2013-06-25 HISTORY — PX: BREAST BIOPSY: SHX20

## 2013-06-26 ENCOUNTER — Telehealth: Payer: Self-pay | Admitting: General Surgery

## 2013-06-26 ENCOUNTER — Encounter: Payer: Self-pay | Admitting: Family Medicine

## 2013-06-26 ENCOUNTER — Ambulatory Visit (INDEPENDENT_AMBULATORY_CARE_PROVIDER_SITE_OTHER): Payer: PRIVATE HEALTH INSURANCE | Admitting: Family Medicine

## 2013-06-26 VITALS — BP 128/78 | HR 72 | Temp 98.2°F | Wt 155.5 lb

## 2013-06-26 DIAGNOSIS — F900 Attention-deficit hyperactivity disorder, predominantly inattentive type: Secondary | ICD-10-CM

## 2013-06-26 DIAGNOSIS — R3915 Urgency of urination: Secondary | ICD-10-CM

## 2013-06-26 DIAGNOSIS — F988 Other specified behavioral and emotional disorders with onset usually occurring in childhood and adolescence: Secondary | ICD-10-CM

## 2013-06-26 LAB — POCT URINALYSIS DIPSTICK
Bilirubin, UA: NEGATIVE
Glucose, UA: NEGATIVE
Spec Grav, UA: 1.015
pH, UA: 6.5

## 2013-06-26 NOTE — Patient Instructions (Addendum)
Let's refer you to urologist for further evaluation of urinary issues. Blood work today to check vitamin D and I will fax this and latest blood work to Dr. Aron Baba.

## 2013-06-26 NOTE — Telephone Encounter (Signed)
The patient was notified that the stereotactic biopsy completed on the left breast 06/25/2013 was benign. She will follow up with the nurse as previously scheduled. Repeat left breast mammogram and office visit in 6 months.

## 2013-06-26 NOTE — Progress Notes (Signed)
  Subjective:    Patient ID: Susan Soto, female    DOB: 08/11/1966, 47 y.o.   MRN: [id number]  HPI CC: 1 mo f/u urine issues  ADD - Saw psychiatrist 2 d ago.  Switched to vyvanse instead of strattera and ritalin.  Psych has requested vit D checked.  Also requests recent blood work faxed to her.  Having urinary urge issues for last 4 months. Rather sudden onset.  Urgency, frequency, incomplete emptying. Becoming irritating at work - due to frequency. Has been evaluated for UTI in past with normal UA/UCx.  Occasional stress incontinence issues. Nocturia x1-2. Foot on floor, key in door phenomenon.  Denies dysuria, fevers/chills, abd pain, back pain, n/v.  Drinks 1 large cup coffee/day. Drinking several bottles of water/day. Does drink close to bedtime 2/2 dry mouth sensation. Never smoker. + second hand smoke growing up.  LMP - 1+ year ago.  Menopausal, early menopause attributed to h/o chemotherapy. Endorses dyspareunia, vaginal dryness.  Wants to avoid any estrogen products 2/2 personal h/o breast cancer. Does not see [facility]  Past Medical History  Diagnosis Date  . ADD (attention deficit disorder) 1990s  . Anxiety   . History of breast cancer 2009    Left side, lumpectomy, s/p sentinal LN biopsy, chemo and radiation, remission  . Subclinical hypothyroidism   . Cancer 2009    breast  . Hemorrhoids     Past Surgical History  Procedure Laterality Date  . Bartholin's cystectomy    . Episiotomy    . Cystectomy      of sutures from episiotomy  . Breast biopsy  05/07/08    Left (infitr ductal CA)- Dr. Lemar Livings  . Breast wide excision  05/27/08    Left-with sentinel node bx (Dr. Lemar Livings)  . Breast biopsy  06/25/13    left   Review of Systems Per HPI    Objective:   Physical Exam Deferred Normal GYN exam at [facility] last month.    Assessment & Plan:

## 2013-06-27 ENCOUNTER — Encounter: Payer: Self-pay | Admitting: Family Medicine

## 2013-06-27 NOTE — Assessment & Plan Note (Addendum)
Somewhat sudden onset of urinary incontinence - correlates with development of menopause. UA today - tr blood but micro reassuring with 0-2 RBC/hpf.  sxs not consistent with interstitial cystitis. Anticipate combination of stress and urge incontinence given description of sxs.  ?benefit from urodynamics. Discussed options of pharmacotherapy trial vs urology referral.  Pt declines medication ("I don't like pills") but would like referral to urology for further evaluation,discussion.  Will refer today. Discussed kegel exercises.

## 2013-06-27 NOTE — Assessment & Plan Note (Signed)
Has established with local psychiatrist - brings her card for [place] to fax labwork. Working on ADD regimen - vyvanse +/- ritalin IR/strattera.

## 2013-06-30 ENCOUNTER — Encounter: Payer: Self-pay | Admitting: Family Medicine

## 2013-06-30 ENCOUNTER — Other Ambulatory Visit: Payer: Self-pay | Admitting: Family Medicine

## 2013-06-30 LAB — VITAMIN D 25 HYDROXY (VIT D DEFICIENCY, FRACTURES): Vit D, 25-Hydroxy: 23.4 ng/mL — ABNORMAL LOW (ref 30.0–100.0)

## 2013-06-30 MED ORDER — CHOLECALCIFEROL 50 MCG (2000 UT) PO CAPS: 1.0000 | ORAL_CAPSULE | Freq: Every day | ORAL | Status: DC

## 2013-07-02 ENCOUNTER — Ambulatory Visit (INDEPENDENT_AMBULATORY_CARE_PROVIDER_SITE_OTHER): Payer: Self-pay | Admitting: *Deleted

## 2013-07-02 ENCOUNTER — Encounter: Payer: Self-pay | Admitting: General Surgery

## 2013-07-02 DIAGNOSIS — R928 Other abnormal and inconclusive findings on diagnostic imaging of breast: Secondary | ICD-10-CM

## 2013-07-02 NOTE — Patient Instructions (Addendum)
Continue self breast exams. Call office for any new breast issues or concerns. 

## 2013-07-02 NOTE — Progress Notes (Signed)
Derma bond lifting and causing irritation at left breast biopsy site. Removed derma bond and added a steri stripe. No signs of infection noted at the site.

## 2013-07-08 ENCOUNTER — Ambulatory Visit (INDEPENDENT_AMBULATORY_CARE_PROVIDER_SITE_OTHER): Payer: Self-pay | Admitting: *Deleted

## 2013-07-08 DIAGNOSIS — N63 Unspecified lump in unspecified breast: Secondary | ICD-10-CM

## 2013-07-08 NOTE — Patient Instructions (Signed)
Culture pending

## 2013-07-08 NOTE — Progress Notes (Signed)
Patient stopped by here today for a post left breast biopsy check. She has concerns of yellow drainage from left biopsy site.  She states she is allergic to adhesives and the derma bond seemed to have irrigated the area more. She also was exposed to someone with MRSA and did not realize it til later.  The area is does more reddened than at her last nurse visit. Steri strip removed and visible drainage noted, culture sent.  She is aware that a possible antibiotic may be ordered once Dr Lemar Livings is made aware of this and that we would be in touch.

## 2013-07-09 ENCOUNTER — Telehealth: Payer: Self-pay | Admitting: *Deleted

## 2013-07-09 NOTE — Telephone Encounter (Signed)
appt made for Wednesday 07-15-13 at 4pm

## 2013-07-09 NOTE — Telephone Encounter (Signed)
Message copied by Currie Paris on Thu Jul 09, 2013 10:01 AM ------      Message from: [place], Merrily Pew      Created: Thu Jul 09, 2013  5:57 AM       Arrange for a f/u visit for MD wound check.      ----- Message -----         From: Currie Paris, RN         Sent: 07/08/2013   3:40 PM           To: Earline Mayotte, MD                   ------

## 2013-07-13 LAB — AEROBIC CULTURE

## 2013-07-14 ENCOUNTER — Telehealth: Payer: Self-pay | Admitting: *Deleted

## 2013-07-14 DIAGNOSIS — Z5189 Encounter for other specified aftercare: Secondary | ICD-10-CM

## 2013-07-14 MED ORDER — SULFAMETHOXAZOLE-TRIMETHOPRIM 800-160 MG PO TABS: 1.0000 | ORAL_TABLET | Freq: Two times a day (BID) | ORAL | Status: DC

## 2013-07-14 NOTE — Telephone Encounter (Signed)
RX sent to [facility]

## 2013-07-14 NOTE — Telephone Encounter (Signed)
Message copied by Currie Paris on Tue Jul 14, 2013  8:12 AM ------      Message from: [place], [place] W      Created: Mon Jul 13, 2013  9:33 PM       Please notify the patient I would like her to start Bactrim DS: 1 po BID x 10. F/u as scheduled  9/24      ----- Message -----         From: [facility] Lab Results In Interface         Sent: 07/13/2013   5:37 AM           To: Earline Mayotte, MD                   ------

## 2013-07-15 ENCOUNTER — Other Ambulatory Visit: Payer: No Typology Code available for payment source

## 2013-07-15 ENCOUNTER — Ambulatory Visit (INDEPENDENT_AMBULATORY_CARE_PROVIDER_SITE_OTHER): Payer: No Typology Code available for payment source | Admitting: General Surgery

## 2013-07-15 ENCOUNTER — Encounter: Payer: Self-pay | Admitting: General Surgery

## 2013-07-15 VITALS — BP 98/62 | HR 72 | Resp 12 | Ht 65.5 in | Wt 153.0 lb

## 2013-07-15 DIAGNOSIS — N611 Abscess of the breast and nipple: Secondary | ICD-10-CM

## 2013-07-15 DIAGNOSIS — N61 Mastitis without abscess: Secondary | ICD-10-CM

## 2013-07-15 NOTE — Patient Instructions (Signed)
Continue self breast exams. Call office for any new breast issues or concerns. 

## 2013-07-15 NOTE — Progress Notes (Signed)
Patient ID: Susan Soto, female   DOB: Apr 10, 1966, 47 y.o.   MRN: [id number]  Chief Complaint  Patient presents with  . Routine Post Op    post left breast stereo with draining wound    HPI Susan Soto is a 47 y.o. female who presents for a post op left breast stereotactic breast biopsy. The procedure was done on 06/25/13. The patient has postoperative wound infection. A culture has been obtained an send to lab and she is currently on Septra. She states the area is still tender and draining. Redness decreased based on RN observation.  HPI  Past Medical History  Diagnosis Date  . ADD (attention deficit disorder) 1990s  . Anxiety   . History of breast cancer 2009    Left side, lumpectomy, s/p sentinal LN biopsy, chemo and radiation, remission  . Subclinical hypothyroidism   . Cancer 2009    breast  . Hemorrhoids     Past Surgical History  Procedure Laterality Date  . Bartholin's cystectomy    . Episiotomy    . Cystectomy      of sutures from episiotomy  . Breast biopsy  05/07/08    Left (infitr ductal CA)- Dr. Lemar Livings  . Breast wide excision  05/27/08    Left-with sentinel node bx (Dr. Lemar Livings)  . Breast biopsy  06/25/13    left    Family History  Problem Relation Age of Onset  . Drug abuse Father     Heroin  . Hypertension Mother   . Fibromyalgia Mother   . ADD / ADHD Mother   . Depression Mother   . Cancer Mother     cervical  . ADD / ADHD Brother   . Anxiety disorder Brother   . Stroke Maternal Grandmother   . Cancer Maternal Grandmother     breast  . Hypertension Paternal Grandmother   . Cancer Paternal Grandfather     lung  . Diabetes Neg Hx   . Coronary artery disease Neg Hx     Social History History  Substance Use Topics  . Smoking status: Never Smoker   . Smokeless tobacco: Never Used  . Alcohol Use: No    Allergies  Allergen Reactions  . Bupropion Hcl     REACTION: Hives  . Buspar [Buspirone Hcl] Nausea And Vomiting  . Other Other (See  Comments)    Dissolving sutures  . Latex Rash    Band aids/adhesives    Current Outpatient Prescriptions  Medication Sig Dispense Refill  . Cholecalciferol ([facility] VITAMIN D) 2000 UNITS CAPS Take 1 capsule (2,000 Units total) by mouth daily.  30 each    . methylphenidate (RITALIN) 5 MG tablet Take 1 tablet (5 mg total) by mouth 2 (two) times daily.  60 tablet  0  . Multiple Vitamins-Minerals (HAIR/SKIN/NAILS/BIOTIN PO) Take 1 tablet by mouth daily.      . Nutritional Supplements (JUICE PLUS FIBRE) LIQD Take by mouth daily.        . Omega-3 Fatty Acids (RA FISH OIL) 900 MG CAPS Take 1 capsule by mouth daily.        Marland Kitchen sulfamethoxazole-trimethoprim (BACTRIM DS,SEPTRA DS) 800-160 MG per tablet Take 1 tablet by mouth 2 (two) times daily.  20 tablet  0   No current facility-administered medications for this visit.    Review of Systems Review of Systems  Constitutional: Negative.   Respiratory: Negative.   Cardiovascular: Negative.     Blood pressure 98/62, pulse 72, resp. rate 12, height 5'  5.5" (1.664 m), weight 153 lb (69.4 kg), last menstrual period 05/26/2012.  Physical Exam Physical Exam  Constitutional: She is oriented to person, place, and time. She appears well-developed and well-nourished.  Pulmonary/Chest:    Lymphadenopathy:    She has no axillary adenopathy.  Neurological: She is alert and oriented to person, place, and time.  Skin: Skin is warm and dry.    Data Reviewed Culture showed 2 organisms, MSS A. as well as Actinobacter. Both sensitive to sulfa. Ultrasound examination of the area of fluctuance along the original excision so superficially located 0.7 x 1.9 x 2.9 cm area of a mixed echo pattern with posterior acoustic enhancement tracking to the biopsy entrance wound. This was just 6 mm below the skin. This is thought to represent an abscess.   Assessment    Post stereotactic biopsy abscess.    Plan    Incision and drainage was recommended and accepted. 10  cc of 0.5% Xylocaine with 0.25% Marcaine with 1 2000 of epinephrine was utilized well tolerated. ChloraPrep was applied to the skin. A 1.2 cm incision was made along the old wide excision scar and the cavity cleared of a small amount of fluid. The area was rinsed with peroxide. The wound was left open for drainage. A dry gauze was applied. The patient tolerated the procedure well. She was instructed to keep the area covered except when showering. She'll complete her present course of antibiotics.       Earline Mayotte 07/16/2013, 7:01 AM

## 2013-07-16 ENCOUNTER — Encounter: Payer: Self-pay | Admitting: General Surgery

## 2013-07-21 ENCOUNTER — Ambulatory Visit (INDEPENDENT_AMBULATORY_CARE_PROVIDER_SITE_OTHER): Payer: Self-pay | Admitting: *Deleted

## 2013-07-21 DIAGNOSIS — N611 Abscess of the breast and nipple: Secondary | ICD-10-CM

## 2013-07-21 DIAGNOSIS — N61 Mastitis without abscess: Secondary | ICD-10-CM

## 2013-07-21 NOTE — Progress Notes (Signed)
The area does seem to be inproving.  White drainage from original site was noticed last night and the lower incision is healing well. Continue antibiotic therapy and follow up with MD as scheduled.

## 2013-07-21 NOTE — Patient Instructions (Signed)
Continue self breast exams. Call office for any new breast issues or concerns. 

## 2013-07-22 ENCOUNTER — Ambulatory Visit: Payer: Self-pay | Admitting: Internal Medicine

## 2013-07-22 LAB — HEPATIC FUNCTION PANEL A (ARMC)
Albumin: 4 g/dL (ref 3.4–5.0)
Alkaline Phosphatase: 70 U/L (ref 50–136)
SGPT (ALT): 25 U/L (ref 12–78)
Total Protein: 7 g/dL (ref 6.4–8.2)

## 2013-07-22 LAB — CBC CANCER CENTER
Basophil #: 0.1 x10 3/mm (ref 0.0–0.1)
Basophil %: 1 %
Eosinophil #: 0.1 x10 3/mm (ref 0.0–0.7)
HCT: 41.1 % (ref 35.0–47.0)
Lymphocyte %: 33.4 %
MCH: 29.8 pg (ref 26.0–34.0)
Monocyte %: 14.8 %
Neutrophil #: 3 x10 3/mm (ref 1.4–6.5)
Neutrophil %: 49.4 %
Platelet: 266 x10 3/mm (ref 150–440)
RBC: 4.58 10*6/uL (ref 3.80–5.20)
RDW: 13.7 % (ref 11.5–14.5)

## 2013-07-22 LAB — CREATININE, SERUM
Creatinine: 0.77 mg/dL (ref 0.60–1.30)
EGFR (African American): >60
EGFR (Non-African Amer.): >60

## 2013-07-23 LAB — CANCER ANTIGEN 27.29: CA 27.29: 20 U/mL (ref 0.0–38.6)

## 2013-07-29 ENCOUNTER — Encounter: Payer: Self-pay | Admitting: General Surgery

## 2013-07-29 ENCOUNTER — Ambulatory Visit (INDEPENDENT_AMBULATORY_CARE_PROVIDER_SITE_OTHER): Payer: Self-pay | Admitting: General Surgery

## 2013-07-29 VITALS — BP 126/76 | HR 80 | Temp 97.8°F | Resp 12 | Ht 65.0 in | Wt 153.0 lb

## 2013-07-29 DIAGNOSIS — N611 Abscess of the breast and nipple: Secondary | ICD-10-CM

## 2013-07-29 DIAGNOSIS — N61 Mastitis without abscess: Secondary | ICD-10-CM

## 2013-07-29 NOTE — Progress Notes (Signed)
Patient ID: Susan Soto, female   DO: May 31, 1966, 47 y.o.   MRM: [id number]  Chief Complaint  Patient presents with  . Follow-up         HPI Susan Soto is a 47 y.o. female here today following up from her incision and drainage done on 07/15/13. She states the area is still draining.She finish her antibiotic Friday.  HPI  Past Medical History  Diagnosis Date  . ADD (attention deficit disorder) 1990s  . Anxiety   . History of breast cancer 2009    Left side, lumpectomy, s/p sentinal LN biopsy, chemo and radiation, remission  . Subclinical hypothyroidism   . Cancer 2009    breast  . Hemorrhoids     Past Surgical History  Procedure Laterality Date  . Bartholin's cystectomy    . Episiotomy    . Cystectomy      of sutures from episiotomy  . Breast biopsy  05/07/08    Left (infitr ductal CA)- Dr. Lemar Livings  . Breast wide excision  05/27/08    Left-with sentinel node bx (Dr. Lemar Livings)  . Breast biopsy  06/25/13    left    Family History  Problem Relation Age of Onset  . Drug abuse Father     Heroin  . Hypertension Mother   . Fibromyalgia Mother   . ADD / ADHD Mother   . Depression Mother   . Cancer Mother     cervical  . ADD / ADHD Brother   . Anxiety disorder Brother   . Stroke Maternal Grandmother   . Cancer Maternal Grandmother     breast  . Hypertension Paternal Grandmother   . Cancer Paternal Grandfather     lung  . Diabetes Neg Hx   . Coronary artery disease Neg Hx     Social History History  Substance Use Topics  . Smoking status: Never Smoker   . Smokeless tobacco: Never Used  . Alcohol Use: No    Allergies  Allergen Reactions  . Bupropion Hcl     REACTION: Hives  . Buspar [Buspirone Hcl] Nausea And Vomiting  . Other Other (See Comments)    Dissolving sutures  . Latex Rash    Band aids/adhesives    Current Outpatient Prescriptions  Medication Sig Dispense Refill  . Cholecalciferol ([facility]S VITAMIN D) 2000 UNITS CAPS Take 1 capsule (2,000 Units  total) by mouth daily.  30 each    . methylphenidate (RITALIN) 5 MG tablet Take 1 tablet (5 mg total) by mouth 2 (two) times daily.  60 tablet  0  . Multiple Vitamins-Minerals (HAIR/SKIN/NAILS/BIOTIN PO) Take 1 tablet by mouth daily.      . Nutritional Supplements (JUICE PLUS FIBRE) LIQD Take by mouth daily.        . Omega-3 Fatty Acids (RA FISH OIL) 900 MG CAPS Take 1 capsule by mouth daily.         No current facility-administered medications for this visit.    Review of Systems Review of Systems  Constitutional: Negative.   Respiratory: Negative.   Cardiovascular: Negative.     Blood pressure 126/76, pulse 80, temperature 97.8 F (36.6 C), resp. rate 12, height 5\' 5"  (1.651 m), weight 153 lb (69.4 kg), last menstrual period 05/26/2012.  Physical Exam Physical Exam  Constitutional: She is oriented to person, place, and time. She appears well-developed and well-nourished.  Pulmonary/Chest:    Slight residual thickening at the incision made along the wide excision scar for drainage. Slight thickening in the intervening  tissue between the core biopsy site and the primary incision. No erythema.  Neurological: She is alert and oriented to person, place, and time.  Skin: Skin is warm and dry.    Data Reviewed Cultures completed last month showed MSSA as well as Actinobacter.   Assessment    Doing well status post incision and drainage of postbiopsy breast abscess.    Plan    The patient is making good progress. I expect the residual thickening will resolve without additional antibiotics. R. arrangements are in place for followup exam in 3 weeks. She was encouraged to call if she has any concerns prior to that time.       Earline Mayotte 07/30/2013, 3:54 PM

## 2013-07-29 NOTE — Patient Instructions (Addendum)
Patient to return in two to three weeks. 

## 2013-08-20 ENCOUNTER — Encounter: Payer: Self-pay | Admitting: General Surgery

## 2013-08-20 ENCOUNTER — Ambulatory Visit (INDEPENDENT_AMBULATORY_CARE_PROVIDER_SITE_OTHER): Payer: Self-pay | Admitting: General Surgery

## 2013-08-20 VITALS — BP 100/64 | HR 64 | Temp 98.5°F | Resp 12 | Ht 65.0 in | Wt 153.0 lb

## 2013-08-20 DIAGNOSIS — N611 Abscess of the breast and nipple: Secondary | ICD-10-CM

## 2013-08-20 DIAGNOSIS — N61 Mastitis without abscess: Secondary | ICD-10-CM

## 2013-08-20 NOTE — Patient Instructions (Signed)
Patient to return in August 2015 bilateral diagnotic mammogram.

## 2013-08-20 NOTE — Progress Notes (Signed)
Patient ID: Susan Soto, female   DOB: [birth year], 47 y.o.   MRN: [id number]  Chief Complaint  Patient presents with  . Follow-up    abscess    HPI Susan Soto is a 47 y.o. female here today following up from her incision and drainage done on 07/15/13 on left breast . She states she is not having any more drainage. She brought with her a small amount of material extruded from the wound. This may represent the previously placed marker from her percutaneous biopsy.  HPI  Past Medical History  Diagnosis Date  . ADD (attention deficit disorder) 1990s  . Anxiety   . History of breast cancer 2009    Left side, lumpectomy, s/p sentinal LN biopsy, chemo and radiation, remission  . Subclinical hypothyroidism   . Cancer 2009    breast  . Hemorrhoids     Past Surgical History  Procedure Laterality Date  . Bartholin's cystectomy    . Episiotomy    . Breast biopsy  05/07/08    Left (infitr ductal CA)- Dr. Lemar Livings  . Breast wide excision  05/27/08    Left-with sentinel node bx (Dr. Lemar Livings)  . Breast biopsy  06/25/13    left  . Cystoscopy      Family History  Problem Relation Age of Onset  . Drug abuse Father     Heroin  . Hypertension Mother   . Fibromyalgia Mother   . ADD / ADHD Mother   . Depression Mother   . Cancer Mother     cervical  . ADD / ADHD Brother   . Anxiety disorder Brother   . Stroke Maternal Grandmother   . Cancer Maternal Grandmother     breast  . Hypertension Paternal Grandmother   . Cancer Paternal Grandfather     lung  . Diabetes Neg Hx   . Coronary artery disease Neg Hx     Social History History  Substance Use Topics  . Smoking status: Never Smoker   . Smokeless tobacco: Never Used  . Alcohol Use: No    Allergies  Allergen Reactions  . Bupropion Hcl     REACTION: Hives  . Buspar [Buspirone Hcl] Nausea And Vomiting  . Other Other (See Comments)    Dissolving sutures  . Latex Rash    Band aids/adhesives    Current Outpatient  Prescriptions  Medication Sig Dispense Refill  . Cholecalciferol ([facility] VITAMIN D) 2000 UNITS CAPS Take 1 capsule (2,000 Units total) by mouth daily.  30 each    . dexmethylphenidate (FOCALIN) 10 MG tablet Take 10 mg by mouth 2 (two) times daily.      . Nutritional Supplements (JUICE PLUS FIBRE) LIQD Take by mouth daily.        . Omega-3 Fatty Acids (RA FISH OIL) 900 MG CAPS Take 1 capsule by mouth daily.         No current facility-administered medications for this visit.    Review of Systems Review of Systems  Constitutional: Negative.   Respiratory: Negative.   Cardiovascular: Negative.     Blood pressure 100/64, pulse 64, temperature 98.5 F (36.9 C), resp. rate 12, height 5\' 5"  (1.651 m), weight 153 lb (69.4 kg), last menstrual period 05/26/2012.  Physical Exam Physical Exam  Constitutional: She appears well-developed and well-nourished.  Pulmonary/Chest:      Data Reviewed None.  Assessment    Doing well status post drainage of focal breast abscess status post vacuum biopsy.    Plan  The patient will return in August 2015 with bilateral mammograms at that time.        Earline Mayotte 08/22/2013, 11:47 AM

## 2013-08-22 ENCOUNTER — Ambulatory Visit: Payer: Self-pay | Admitting: Internal Medicine

## 2014-02-22 ENCOUNTER — Other Ambulatory Visit (INDEPENDENT_AMBULATORY_CARE_PROVIDER_SITE_OTHER): Payer: PRIVATE HEALTH INSURANCE

## 2014-02-22 ENCOUNTER — Other Ambulatory Visit: Payer: Self-pay | Admitting: Family Medicine

## 2014-02-22 DIAGNOSIS — E038 Other specified hypothyroidism: Secondary | ICD-10-CM

## 2014-02-22 DIAGNOSIS — E039 Hypothyroidism, unspecified: Secondary | ICD-10-CM

## 2014-02-22 DIAGNOSIS — Z0000 Encounter for general adult medical examination without abnormal findings: Secondary | ICD-10-CM

## 2014-02-22 DIAGNOSIS — E559 Vitamin D deficiency, unspecified: Secondary | ICD-10-CM | POA: Insufficient documentation

## 2014-02-23 LAB — BASIC METABOLIC PANEL
BUN/Creatinine Ratio: 19 (ref 9–23)
BUN: 11 mg/dL (ref 6–24)
CALCIUM: 9.5 mg/dL (ref 8.7–10.2)
CHLORIDE: 101 mmol/L (ref 97–108)
CO2: 24 mmol/L (ref 18–29)
Creatinine, Ser: 0.57 mg/dL (ref 0.57–1.00)
GFR calc Af Amer: 127 mL/min/{1.73_m2} (ref >59)
GFR calc non Af Amer: 110 mL/min/{1.73_m2} (ref >59)
GLUCOSE: 84 mg/dL (ref 65–99)
Potassium: 4.3 mmol/L (ref 3.5–5.2)
Sodium: 141 mmol/L (ref 134–144)

## 2014-02-23 LAB — LIPID PANEL
CHOL/HDL RATIO: 2.4 ratio (ref 0.0–4.4)
Cholesterol, Total: 139 mg/dL (ref 100–199)
HDL: 57 mg/dL (ref >39)
LDL Calculated: 75 mg/dL (ref 0–99)
Triglycerides: 36 mg/dL (ref 0–149)
VLDL CHOLESTEROL CAL: 7 mg/dL (ref 5–40)

## 2014-02-23 LAB — VITAMIN D 25 HYDROXY (VIT D DEFICIENCY, FRACTURES): Vit D, 25-Hydroxy: 38 ng/mL (ref 30.0–100.0)

## 2014-02-23 LAB — TSH: TSH: 2.81 u[IU]/mL (ref 0.450–4.500)

## 2014-02-26 ENCOUNTER — Encounter: Payer: Self-pay | Admitting: Family Medicine

## 2014-02-26 ENCOUNTER — Ambulatory Visit (INDEPENDENT_AMBULATORY_CARE_PROVIDER_SITE_OTHER): Payer: PRIVATE HEALTH INSURANCE | Admitting: Family Medicine

## 2014-02-26 VITALS — BP 110/60 | HR 84 | Temp 98.1°F | Ht 65.5 in | Wt 145.0 lb

## 2014-02-26 DIAGNOSIS — N951 Menopausal and female climacteric states: Secondary | ICD-10-CM

## 2014-02-26 DIAGNOSIS — E559 Vitamin D deficiency, unspecified: Secondary | ICD-10-CM

## 2014-02-26 DIAGNOSIS — R32 Unspecified urinary incontinence: Secondary | ICD-10-CM

## 2014-02-26 DIAGNOSIS — E039 Hypothyroidism, unspecified: Secondary | ICD-10-CM

## 2014-02-26 DIAGNOSIS — F900 Attention-deficit hyperactivity disorder, predominantly inattentive type: Secondary | ICD-10-CM

## 2014-02-26 DIAGNOSIS — F988 Other specified behavioral and emotional disorders with onset usually occurring in childhood and adolescence: Secondary | ICD-10-CM

## 2014-02-26 DIAGNOSIS — E038 Other specified hypothyroidism: Secondary | ICD-10-CM

## 2014-02-26 NOTE — Assessment & Plan Note (Signed)
As of last [place] by urogyn.

## 2014-02-26 NOTE — Patient Instructions (Signed)
Good to see you, call us with quesitons. Return at your convenience for physical.

## 2014-02-26 NOTE — Assessment & Plan Note (Signed)
TSH stable. Lab Results  Component Value Date   TSH 2.810 02/22/2014

## 2014-02-26 NOTE — Assessment & Plan Note (Signed)
Stable on current regimen of focalin + dexedrine.  Also started on vayacog and vit D 5000 daily. We will fax blood work from today to psychiatrist. Overall doing well.

## 2014-02-26 NOTE — Progress Notes (Signed)
BP 110/60  Pulse 84  Temp(Src) 98.1 F (36.7 C) (Oral)  Ht 5' 5.5" (1.664 m)  Wt 145 lb (65.772 kg)  BMI 23.75 kg/m2  LMP 02/22/2014   CC: f/u visit  Subjective:    Patient ID: Susan Soto, female    DOB: May 19, 1966, 48 y.o.   MRN: [id number]  HPI: Zandra Lajeunesse is a 48 y.o. female presenting on 02/26/2014 for Follow-up   Initially scheduled CPE but actually had one 05/2013. Changed to acute visit.  Breast biopsy - calcium deposits.  Abscess - s/p I&D. ([place]).  Next mammogram scheduled for 05/2014.  Saw [facility] urogyn - pelvic US.  Bladder sxs improved off strattera.  Follicles, one ovarian cyst + uterine fibroids.  On f/u cyst resolved.  This was late 2014.  Irregular periods - perimenopausal.  Currently on period.  Psychiatry - Dr. Trellis Moment.  Sees her for ADD.  On focalin and dexadrine.  Also started on vayacog and 5000 u vit D daily.  Relevant past medical, surgical, family and social history reviewed and updated as indicated.  Allergies and medications reviewed and updated. Current Outpatient Prescriptions on File Prior to Visit  Medication Sig  . dexmethylphenidate (FOCALIN) 10 MG tablet Take 5 mg by mouth daily.   . Nutritional Supplements (JUICE PLUS FIBRE) LIQD Take by mouth daily.     No current facility-administered medications on file prior to visit.    Review of Systems Per HPI unless specifically indicated above    Objective:    BP 110/60  Pulse 84  Temp(Src) 98.1 F (36.7 C) (Oral)  Ht 5' 5.5" (1.664 m)  Wt 145 lb (65.772 kg)  BMI 23.75 kg/m2  LMP 02/22/2014  Physical Exam  Nursing note and vitals reviewed. Constitutional: She appears well-developed and well-nourished. No distress.  HENT:  Mouth/Throat: Oropharynx is clear and moist. No oropharyngeal exudate.  Cardiovascular: Normal rate, regular rhythm, normal heart sounds and intact distal pulses.   No murmur heard. Pulmonary/Chest: Effort normal and breath sounds normal. No respiratory distress.  She has no wheezes. She has no rales.  Musculoskeletal: She exhibits no edema.  Skin: Skin is warm and dry. No rash noted.  Psychiatric: She has a normal mood and affect.   Results for orders placed in visit on 02/22/14  LIPID PANEL      Result Value Ref Range   Cholesterol, Total 139  100 - 199 mg/dL   Triglycerides 36  0 - 149 mg/dL   HDL 57  >39 mg/dL   VLDL Cholesterol Cal 7  5 - 40 mg/dL   LDL Calculated 75  0 - 99 mg/dL   Chol/HDL Ratio 2.4  0.0 - 4.4 ratio units  BASIC METABOLIC PANEL      Result Value Ref Range   Glucose 84  65 - 99 mg/dL   BUN 11  6 - 24 mg/dL   Creatinine, Ser 0.57  0.57 - 1.00 mg/dL   GFR calc non Af Amer 110  >59 mL/min/1.73   GFR calc Af Amer 127  >59 mL/min/1.73   BUN/Creatinine Ratio 19  9 - 23   Sodium 141  134 - 144 mmol/L   Potassium 4.3  3.5 - 5.2 mmol/L   Chloride 101  97 - 108 mmol/L   CO2 24  18 - 29 mmol/L   Calcium 9.5  8.7 - 10.2 mg/dL  TSH      Result Value Ref Range   TSH 2.810  0.450 - 4.500 uIU/mL  VITAMIN  D 25 HYDROXY      Result Value Ref Range   Vit D, 25-Hydroxy 38.0  30.0 - 100.0 ng/mL      Assessment & Plan:  Blood work reviewed with patient. Left open ended f/u - up to pt to decide when to schedule physical (6-12 months).  Problem List Items Addressed This Visit   Subclinical hypothyroidism      TSH stable. Lab Results  Component Value Date   TSH 2.810 02/22/2014      ADD (attention deficit hyperactivity disorder, inattentive type)     Stable on current regimen of focalin + dexedrine.  Also started on vayacog and vit D 5000 daily. We will fax blood work from today to psychiatrist. Overall doing well.    Perimenopausal     As of last [place] by urogyn.    Incontinence of urine in female     Improved off strattera.    Vitamin D deficiency - Primary     Continue vit D supplementation.        Follow up plan: Return as needed, for physical.

## 2014-02-26 NOTE — Assessment & Plan Note (Signed)
Improved off strattera.

## 2014-02-26 NOTE — Assessment & Plan Note (Signed)
Continue vit D supplementation

## 2014-05-04 ENCOUNTER — Encounter: Payer: Self-pay | Admitting: Family Medicine

## 2014-05-04 ENCOUNTER — Ambulatory Visit (INDEPENDENT_AMBULATORY_CARE_PROVIDER_SITE_OTHER): Payer: PRIVATE HEALTH INSURANCE | Admitting: Family Medicine

## 2014-05-04 VITALS — BP 110/60 | HR 68 | Temp 97.3°F | Wt 142.2 lb

## 2014-05-04 DIAGNOSIS — R002 Palpitations: Secondary | ICD-10-CM

## 2014-05-04 DIAGNOSIS — E039 Hypothyroidism, unspecified: Secondary | ICD-10-CM

## 2014-05-04 DIAGNOSIS — R5383 Other fatigue: Secondary | ICD-10-CM

## 2014-05-04 DIAGNOSIS — F900 Attention-deficit hyperactivity disorder, predominantly inattentive type: Secondary | ICD-10-CM

## 2014-05-04 DIAGNOSIS — E038 Other specified hypothyroidism: Secondary | ICD-10-CM

## 2014-05-04 DIAGNOSIS — R5381 Other malaise: Secondary | ICD-10-CM

## 2014-05-04 DIAGNOSIS — F909 Attention-deficit hyperactivity disorder, unspecified type: Secondary | ICD-10-CM

## 2014-05-04 LAB — CBC WITH DIFFERENTIAL/PLATELET
BASOS PCT: 0.3 % (ref 0.0–3.0)
Basophils Absolute: 0 10*3/uL (ref 0.0–0.1)
Eosinophils Absolute: 0.1 10*3/uL (ref 0.0–0.7)
Eosinophils Relative: 1.6 % (ref 0.0–5.0)
HCT: 41.8 % (ref 36.0–46.0)
Hemoglobin: 13.9 g/dL (ref 12.0–15.0)
Lymphocytes Relative: 41 % (ref 12.0–46.0)
Lymphs Abs: 2.4 10*3/uL (ref 0.7–4.0)
MCHC: 33.2 g/dL (ref 30.0–36.0)
MCV: 91.4 fl (ref 78.0–100.0)
MONO ABS: 0.7 10*3/uL (ref 0.1–1.0)
Monocytes Relative: 11.3 % (ref 3.0–12.0)
NEUTROS PCT: 45.8 % (ref 43.0–77.0)
Neutro Abs: 2.6 10*3/uL (ref 1.4–7.7)
PLATELETS: 243 10*3/uL (ref 150.0–400.0)
RBC: 4.57 Mil/uL (ref 3.87–5.11)
RDW: 13.5 % (ref 11.5–15.5)
WBC: 5.8 10*3/uL (ref 4.0–10.5)

## 2014-05-04 NOTE — Addendum Note (Signed)
Addended by: Royann Shivers A on: 05/04/2014 10:44 AM   Modules accepted: Orders

## 2014-05-04 NOTE — Assessment & Plan Note (Addendum)
New onset palpitations with marked fatigue and some chest discomfort which does not sound cardiac in origin. Check CBC, B12, TSH (in h/o subclinical hypothyroidism). Will also refer to cardiology for further eval, consider holter monitor. Advised against continued stimulants for now until cardiac evaluation. EKG - sinus brady rate 55, normal axis, intervals, no acute ST/T changes

## 2014-05-04 NOTE — Progress Notes (Signed)
Pre visit review using our clinic review tool, if applicable. No additional management support is needed unless otherwise documented below in the visit note. 

## 2014-05-04 NOTE — Assessment & Plan Note (Signed)
Followed by psych. Encouraged stimulant holiday until she sees cards. Regimen is focalin + dexedrine. Also on vayacog and vit D 5000 daily.

## 2014-05-04 NOTE — Progress Notes (Addendum)
BP 110/60  Pulse 68  Temp(Src) 97.3 F (36.3 C) (Oral)  Wt 142 lb 4 oz (64.524 kg)  LMP 03/04/2014   CC: "flip flopping and heaviness in chest"  Subjective:    Patient ID: Susan Soto, female    DOB: [birth year], 49 y.o.   MRN: [id number]  HPI: Susan Soto is a 48 y.o. female presenting on 05/04/2014 for Chest Pain   Endorses 2 wk h/o palpitations, chest heaviness and fatigue. Trouble taking deep breath when feels palpitations. Palpitations described as skipped beats and "flip flopping" also feels pulse hard.  Chest heaviness is substernal, comes on at rest. Not exertional. Laying on her side worsens discomfort. No nausea or diapherosis or sweating with chest discomfort. No h/o asthma or GERD. No fmhx CAD. H/o MVP in early 20s, but then disappeared. H/o chemotherapy for breast cancer (2010).  Did have med vacation 3 wks ago on trip to [place]. Fatigue did improve while off meds. Returned 04/16/2014 and restarted all meds at 1/2 dose. Fatigue persists despite taking focalin. Has stopped stimulants for last 2 days but sxs have persisted.  Psychiatry - Dr. Trellis Moment. Sees her for ADD. On focalin and dexadrine. Also started on vayacog and 5000 u vit D daily. Recent start of focalin and vayacog 02/2014.  Thinks may have scratched mole off back.  No recent tick bites. Was exposed to grandmother's C diff but pt denies diarrhea. No fevers/chills, skin rashes, headaches. Wt Readings from Last 3 Encounters:  05/04/14 142 lb 4 oz (64.524 kg)  02/26/14 145 lb (65.772 kg)  08/20/13 153 lb (69.4 kg)  Body mass index is 23.3 kg/(m^2).  Lab Results  Component Value Date   CHOL 136 11/08/2010   HDL 57 02/22/2014   LDLCALC 75 02/22/2014   TRIG 36 02/22/2014   CHOLHDL 2.4 02/22/2014   Past Medical History  Diagnosis Date  . ADD (attention deficit disorder) 1990s  . Anxiety   . History of breast cancer 2009    Left side, lumpectomy, s/p sentinal LN biopsy, chemo and radiation, remission  .  Subclinical hypothyroidism   . Cancer 2009    breast  . Hemorrhoids     Family History  Problem Relation Age of Onset  . Drug abuse Father     Heroin  . Hypertension Mother   . Fibromyalgia Mother   . ADD / ADHD Mother   . Depression Mother   . Cancer Mother     cervical  . ADD / ADHD Brother   . Anxiety disorder Brother   . Stroke Maternal Grandmother   . Cancer Maternal Grandmother     breast  . Hypertension Paternal Grandmother   . Cancer Paternal Grandfather     lung  . Diabetes Neg Hx   . Coronary artery disease Neg Hx     Relevant past medical, surgical, family and social history reviewed and updated as indicated.  Allergies and medications reviewed and updated. Current Outpatient Prescriptions on File Prior to Visit  Medication Sig  . Cholecalciferol (VITAMIN D-3) 5000 UNITS TABS Take 1 tablet by mouth daily.  Marland Kitchen dexmethylphenidate (FOCALIN) 10 MG tablet Take 5 mg by mouth daily.   Marland Kitchen dextroamphetamine (DEXEDRINE) 5 MG tablet Take 5 mg by mouth daily. 2 every morning and 1 every afternoon  . Nutritional Supplements (JUICE PLUS FIBRE) LIQD Take by mouth daily.    . Phosphatidylserine-DHA-EPA (VAYACOG) 100-19.5-6.5 MG CAPS Take 1 capsule by mouth daily.   No current facility-administered medications on  file prior to visit.    Review of Systems Per HPI unless specifically indicated above    Objective:    BP 110/60  Pulse 68  Temp(Src) 97.3 F (36.3 C) (Oral)  Wt 142 lb 4 oz (64.524 kg)  LMP 03/04/2014  Physical Exam  Nursing note and vitals reviewed. Constitutional: She appears well-developed and well-nourished. No distress.  HENT:  Mouth/Throat: Oropharynx is clear and moist. No oropharyngeal exudate.  Neck: Normal range of motion. Neck supple. No thyromegaly present.  Cardiovascular: Normal rate, regular rhythm, normal heart sounds and intact distal pulses.   No murmur heard. Pulmonary/Chest: Effort normal and breath sounds normal. No respiratory  distress. She has no wheezes. She has no rales.  Musculoskeletal: She exhibits no edema.  Lymphadenopathy:    She has no cervical adenopathy.  Skin: Skin is warm and dry. No rash noted.  Psychiatric: She has a normal mood and affect.   Results for orders placed in visit on 02/22/14  LIPID PANEL      Result Value Ref Range   Cholesterol, Total 139  100 - 199 mg/dL   Triglycerides 36  0 - 149 mg/dL   HDL 57  >39 mg/dL   VLDL Cholesterol Cal 7  5 - 40 mg/dL   LDL Calculated 75  0 - 99 mg/dL   Chol/HDL Ratio 2.4  0.0 - 4.4 ratio units  BASIC METABOLIC PANEL      Result Value Ref Range   Glucose 84  65 - 99 mg/dL   BUN 11  6 - 24 mg/dL   Creatinine, Ser 0.57  0.57 - 1.00 mg/dL   GFR calc non Af Amer 110  >59 mL/min/1.73   GFR calc Af Amer 127  >59 mL/min/1.73   BUN/Creatinine Ratio 19  9 - 23   Sodium 141  134 - 144 mmol/L   Potassium 4.3  3.5 - 5.2 mmol/L   Chloride 101  97 - 108 mmol/L   CO2 24  18 - 29 mmol/L   Calcium 9.5  8.7 - 10.2 mg/dL  TSH      Result Value Ref Range   TSH 2.810  0.450 - 4.500 uIU/mL  VITAMIN D 25 HYDROXY      Result Value Ref Range   Vit D, 25-Hydroxy 38.0  30.0 - 100.0 ng/mL      Assessment & Plan:   Problem List Items Addressed This Visit   Subclinical hypothyroidism     Check TSH.    Relevant Orders      TSH   Palpitations - Primary     New onset palpitations with marked fatigue and some chest discomfort which does not sound cardiac in origin. Check CBC, B12, TSH (in h/o subclinical hypothyroidism). Will also refer to cardiology for further eval, consider holter monitor. Advised against continued stimulants for now until cardiac evaluation. EKG - sinus brady rate 55, normal axis, intervals, no acute ST/T changes    Relevant Orders      CBC with Differential      TSH      Ambulatory referral to Cardiology      EKG 12-Lead (Completed)   ADD (attention deficit hyperactivity disorder, inattentive type)     Followed by psych. Encouraged  stimulant holiday until she sees cards. Regimen is focalin + dexedrine. Also on vayacog and vit D 5000 daily.     Other Visit Diagnoses   Other fatigue        Relevant Orders  Vitamin B12       CBC with Differential       Potassium        Follow up plan: Return if symptoms worsen or fail to improve.

## 2014-05-04 NOTE — Patient Instructions (Signed)
Blood work today. Baseline EKG today. This does not sound like heart blockage related. I would like you to see heart doctor for further evaluation of these palpitations prior to restarting regular doses of stimulant meds for ADD Good to see you today, call us with questions.

## 2014-05-04 NOTE — Assessment & Plan Note (Signed)
Check TSH 

## 2014-05-05 LAB — VITAMIN B12: Vitamin B-12: 308 pg/mL (ref 211–911)

## 2014-05-05 LAB — POTASSIUM: Potassium: 4.3 mEq/L (ref 3.5–5.1)

## 2014-05-05 LAB — TSH: TSH: 2.63 u[IU]/mL (ref 0.35–4.50)

## 2014-05-06 ENCOUNTER — Encounter: Payer: Self-pay | Admitting: *Deleted

## 2014-05-07 ENCOUNTER — Encounter: Payer: Self-pay | Admitting: Cardiovascular Disease

## 2014-05-07 ENCOUNTER — Ambulatory Visit (INDEPENDENT_AMBULATORY_CARE_PROVIDER_SITE_OTHER): Payer: PRIVATE HEALTH INSURANCE | Admitting: Cardiovascular Disease

## 2014-05-07 ENCOUNTER — Other Ambulatory Visit (INDEPENDENT_AMBULATORY_CARE_PROVIDER_SITE_OTHER): Payer: PRIVATE HEALTH INSURANCE

## 2014-05-07 VITALS — BP 104/88 | HR 63 | Ht 65.5 in | Wt 143.2 lb

## 2014-05-07 DIAGNOSIS — R0609 Other forms of dyspnea: Secondary | ICD-10-CM | POA: Insufficient documentation

## 2014-05-07 DIAGNOSIS — I359 Nonrheumatic aortic valve disorder, unspecified: Secondary | ICD-10-CM

## 2014-05-07 DIAGNOSIS — R0602 Shortness of breath: Secondary | ICD-10-CM

## 2014-05-07 DIAGNOSIS — R079 Chest pain, unspecified: Secondary | ICD-10-CM

## 2014-05-07 DIAGNOSIS — R002 Palpitations: Secondary | ICD-10-CM

## 2014-05-07 NOTE — Patient Instructions (Signed)
Your physician has recommended that you wear a holter monitor 48 hr. Holter monitors are medical devices that record the heart's electrical activity. Doctors most often use these monitors to diagnose arrhythmias. Arrhythmias are problems with the speed or rhythm of the heartbeat. The monitor is a small, portable device. You can wear one while you do your normal daily activities. This is usually used to diagnose what is causing palpitations/syncope (passing out).  Your physician has requested that you have an echocardiogram today. Echocardiography is a painless test that uses sound waves to create images of your heart. It provides your doctor with information about the size and shape of your heart and how well your heart's chambers and valves are working. This procedure takes approximately one hour. There are no restrictions for this procedure.   Your physician recommends that you schedule a follow-up appointment in:  As needed

## 2014-05-07 NOTE — Progress Notes (Signed)
Primary care physician: Dr. Danise Mina  HPI  This is a pleasant 48 year old female who was referred for evaluation of palpitation, chest pain and shortness of breath. 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 She was told in her early 51s that she had mitral valve prolapse. She had previous breast cancer in 2009 treated with chemotherapy and radiation therapy. She has known history of ADD and anxiety. She had issues with palpitations while on the ADD medications. She stopped these medications recently and she continues to complain of palpitations over the last month described as skipping and occasional tachycardia. She complains of being very tired with occasional episodes of chest pressure, shortness of breath and dizziness. She drinks one cup of coffee a day. She does not smoke and has no family history of coronary artery disease, arrhythmia or sudden death.  Allergies  Allergen Reactions  . Bupropion Hcl     REACTION: Hives  . Buspar [Buspirone Hcl] Nausea And Vomiting  . Other Other (See Comments)    Dissolving sutures  . Strattera [Atomoxetine Hcl] Other (See Comments)    Urinary trouble  . Latex Rash    Band aids/adhesives     Current Outpatient Prescriptions on File Prior to Visit  Medication Sig Dispense Refill  . Cholecalciferol (VITAMIN D-3) 5000 UNITS TABS Take 2 tablets by mouth daily.       Marland Kitchen dexmethylphenidate (FOCALIN) 10 MG tablet Take 5 mg by mouth daily.       . Nutritional Supplements (JUICE PLUS FIBRE) LIQD Take by mouth daily.        . Phosphatidylserine-DHA-EPA (VAYACOG) 100-19.5-6.5 MG CAPS Take 1 capsule by mouth daily.       No current facility-administered medications on file prior to visit.     Past Medical History  Diagnosis Date  . ADD (attention deficit disorder) 1990s  . Anxiety   . History of breast cancer 2009    Left side, lumpectomy, s/p sentinal LN biopsy, chemo and radiation, remission  . Subclinical hypothyroidism   . Hemorrhoids   .  Syncope and collapse   . Heart murmur   . Cancer 2009    breast     Past Surgical History  Procedure Laterality Date  . Bartholin's cystectomy    . Episiotomy    . Breast biopsy  05/07/08    Left (infitr ductal CA)- Dr. Bary Castilla  . Breast wide excision  05/27/08    Left-with sentinel node bx (Dr. Bary Castilla)  . Breast biopsy  06/25/13    left  . Cystoscopy       Family History  Problem Relation Age of Onset  . Drug abuse Father     Heroin  . Hypertension Mother   . Fibromyalgia Mother   . ADD / ADHD Mother   . Depression Mother   . Cancer Mother     cervical  . ADD / ADHD Brother   . Anxiety disorder Brother   . Stroke Maternal Grandmother   . Cancer Maternal Grandmother     breast  . Hypertension Paternal Grandmother   . Cancer Paternal Grandfather     lung  . Diabetes Neg Hx   . Coronary artery disease Neg Hx      History   Social History  . Marital Status: Married    Spouse Name: N/A    Number of Children: N/A  . Years of Education: N/A   Occupational History  . Social Worker    Social History Main Topics  .  Smoking status: Never Smoker   . Smokeless tobacco: Never Used  . Alcohol Use: No  . Drug Use: No  . Sexual Activity: Not on file   Other Topics Concern  . Not on file   Social History Narrative   Caffeine: 1 cup coffee in am   Lives with husband and 1 son, daughter in college, 1 dog and 1 cat, 5-6 outside cats   Occupation: Camera operator job   Activity: no regular activity   Diet: joined [employer], good water, fruits/vegetables daily, red meat 3x/wk, fish 2x/wk      Psych- Dr. Wallis Mart in [place] (ph [phone])     ROS A 10 point review of system was performed. It is negative other than that mentioned in the history of present illness.   PHYSICAL EXAM   BP 104/88  Pulse 63  Ht 5' 5.5" (1.664 m)  Wt 143 lb 4 oz (64.978 kg)  BMI 23.47 kg/m2  LMP 03/04/2014 Constitutional: She is oriented to person, place, and time.  She appears well-developed and well-nourished. No distress.  HENT: No nasal discharge.  Head: Normocephalic and atraumatic.  Eyes: Pupils are equal and round. No discharge.  Neck: Normal range of motion. Neck supple. No JVD present. No thyromegaly present.  Cardiovascular: Normal rate, regular rhythm, normal heart sounds. Exam reveals no gallop and no friction rub. No murmur heard.  Pulmonary/Chest: Effort normal and breath sounds normal. No stridor. No respiratory distress. She has no wheezes. She has no rales. She exhibits no tenderness.  Abdominal: Soft. Bowel sounds are normal. She exhibits no distension. There is no tenderness. There is no rebound and no guarding.  Musculoskeletal: Normal range of motion. She exhibits no edema and no tenderness.  Neurological: She is alert and oriented to person, place, and time. Coordination normal.  Skin: Skin is warm and dry. No rash noted. She is not diaphoretic. No erythema. No pallor.  Psychiatric: She has a normal mood and affect. Her behavior is normal. Judgment and thought content normal.     EKG: Normal sinus rhythm with no significant ST or T wave changes.   ASSESSMENT AND PLAN

## 2014-05-07 NOTE — Assessment & Plan Note (Signed)
She reports episodes of shortness of breath and chest pain. She did have previous chemotherapy and radiation therapy for breast cancer. Thus, she is at risk for cardiomyopathy or pericardial disease. Thus, I requested an echocardiogram for evaluation.

## 2014-05-07 NOTE — Assessment & Plan Note (Signed)
This could be due to premature beats and possible sinus tachycardia. I requested a 48-hour Holter monitor. I instructed her to take her ADD medications while she is wearing the monitor to see if there is any evidence of arrhythmia.

## 2014-05-12 ENCOUNTER — Telehealth: Payer: Self-pay | Admitting: *Deleted

## 2014-05-12 NOTE — Telephone Encounter (Signed)
Informed patient that holter was ordered

## 2014-05-14 DIAGNOSIS — R002 Palpitations: Secondary | ICD-10-CM

## 2014-05-26 ENCOUNTER — Ambulatory Visit (INDEPENDENT_AMBULATORY_CARE_PROVIDER_SITE_OTHER): Payer: PRIVATE HEALTH INSURANCE

## 2014-05-26 ENCOUNTER — Other Ambulatory Visit: Payer: Self-pay

## 2014-05-26 ENCOUNTER — Telehealth: Payer: Self-pay | Admitting: *Deleted

## 2014-05-26 DIAGNOSIS — R002 Palpitations: Secondary | ICD-10-CM

## 2014-05-26 NOTE — Telephone Encounter (Signed)
Reviewed results with patient  Scheduled appt

## 2014-05-26 NOTE — Telephone Encounter (Signed)
LVM to inform patient that per Dr. Fletcher Anon: Holter showed Normal Sinus rhythm  Frequent PVC's and PAC's  Night time bradycardia and pauses < 2 seconds   Patient needs a follow up with Dr. Fletcher Anon

## 2014-05-28 ENCOUNTER — Encounter: Payer: Self-pay | Admitting: General Surgery

## 2014-06-09 ENCOUNTER — Ambulatory Visit: Payer: Self-pay | Admitting: Internal Medicine

## 2014-06-11 ENCOUNTER — Ambulatory Visit: Payer: PRIVATE HEALTH INSURANCE | Admitting: Cardiovascular Disease

## 2014-06-14 ENCOUNTER — Telehealth: Payer: Self-pay | Admitting: *Deleted

## 2014-06-14 NOTE — Telephone Encounter (Signed)
Patient would like you to call her again regarding holter monitor results.

## 2014-06-14 NOTE — Telephone Encounter (Signed)
LVM 8/24

## 2014-06-16 ENCOUNTER — Encounter: Payer: Self-pay | Admitting: General Surgery

## 2014-06-16 ENCOUNTER — Ambulatory Visit (INDEPENDENT_AMBULATORY_CARE_PROVIDER_SITE_OTHER): Payer: No Typology Code available for payment source | Admitting: General Surgery

## 2014-06-16 VITALS — BP 124/68 | HR 74 | Resp 12 | Ht 65.5 in | Wt 145.0 lb

## 2014-06-16 DIAGNOSIS — Z853 Personal history of malignant neoplasm of breast: Secondary | ICD-10-CM

## 2014-06-16 NOTE — Progress Notes (Signed)
Patient ID: Susan Soto, female   DOB: Dec 10, 1965, 48 y.o.   MRN: [id number]  Chief Complaint  Patient presents with  . Follow-up    mammogram    HPI Susan Soto is a 48 y.o. female who presents for a breast evaluation. The most recent mammogram was done on 06/09/14. Patient does not perform regular self breast checks but does get regular mammograms done. She denies any problems with the breasts at this time.     HPI  Past Medical History  Diagnosis Date  . ADD (attention deficit disorder) 1990s  . Anxiety   . History of breast cancer 2009    Left side, lumpectomy, s/p sentinal LN biopsy, chemo and radiation, remission  . Subclinical hypothyroidism   . Hemorrhoids   . Syncope and collapse   . Heart murmur   . Cancer 2009    breast    Past Surgical History  Procedure Laterality Date  . Bartholin's cystectomy    . Episiotomy    . Breast wide excision  05/27/08    Left-with sentinel node bx (Dr. Bary Castilla)  . Cystoscopy    . Breast biopsy  05/07/08    Left (infitr ductal CA)- Dr. Bary Castilla  . Breast biopsy  06/25/13    left    Family History  Problem Relation Age of Onset  . Drug abuse Father     Heroin  . Hypertension Mother   . Fibromyalgia Mother   . ADD / ADHD Mother   . Depression Mother   . Cancer Mother     cervical  . ADD / ADHD Brother   . Anxiety disorder Brother   . Stroke Maternal Grandmother   . Cancer Maternal Grandmother     breast  . Hypertension Paternal Grandmother   . Cancer Paternal Grandfather     lung  . Diabetes Neg Hx   . Coronary artery disease Neg Hx     Social History History  Substance Use Topics  . Smoking status: Never Smoker   . Smokeless tobacco: Never Used  . Alcohol Use: No    Allergies  Allergen Reactions  . Bupropion Hcl     REACTION: Hives  . Buspar [Buspirone Hcl] Nausea And Vomiting  . Other Other (See Comments)    Dissolving sutures  . Strattera [Atomoxetine Hcl] Other (See Comments)    Urinary trouble  .  Latex Rash    Band aids/adhesives    Current Outpatient Prescriptions  Medication Sig Dispense Refill  . aspirin 81 MG tablet Take 81 mg by mouth daily.      . Cholecalciferol (VITAMIN D-3) 5000 UNITS TABS Take 2 tablets by mouth daily.       Marland Kitchen dexmethylphenidate (FOCALIN) 10 MG tablet Take 5 mg by mouth daily.       Marland Kitchen dextroamphetamine (DEXEDRINE SPANSULE) 10 MG 24 hr capsule Take 5 mg by mouth 2 (two) times daily.       Mariane Baumgarten Calcium (STOOL SOFTENER PO) Take by mouth as needed.      . Nutritional Supplements (JUICE PLUS FIBRE) LIQD Take by mouth daily.        . Phosphatidylserine-DHA-EPA (VAYACOG) 100-19.5-6.5 MG CAPS Take 1 capsule by mouth daily.      . Probiotic Product (PROBIOTIC DAILY PO) Take by mouth daily.      . metoprolol tartrate (LOPRESSOR) 25 MG tablet Take 0.5 tablets (12.5 mg total) by mouth 2 (two) times daily as needed.  90 tablet  3  No current facility-administered medications for this visit.    Review of Systems Review of Systems  Constitutional: Negative.   Respiratory: Negative.   Cardiovascular: Negative.     Blood pressure 124/68, pulse 74, resp. rate 12, height 5' 5.5" (1.664 m), weight 145 lb (65.772 kg), last menstrual period 03/04/2014.  Physical Exam Physical Exam  Constitutional: She is oriented to person, place, and time. She appears well-developed and well-nourished.  Neck: Neck supple. No thyromegaly present.  Cardiovascular: Normal rate, regular rhythm and normal heart sounds.   No murmur heard. Pulmonary/Chest: Effort normal and breath sounds normal. Right breast exhibits no inverted nipple, no mass, no nipple discharge, no skin change and no tenderness. Left breast exhibits no inverted nipple, no mass, no nipple discharge, no skin change and no tenderness.  Lymphadenopathy:    She has no cervical adenopathy.    She has no axillary adenopathy.  Neurological: She is alert and oriented to person, place, and time.  Skin: Skin is warm and  dry.    Data Reviewed Bilateral diagnostic mammogram scheduled by Leia Alf, M.D. Dated June 09, 2014 were reviewed. Postsurgical changes noted on the left. BI-RAD-2.  Assessment    Benign breast exam now 6 years status post conservative treatment.     Plan    The patient had some concerns about Episodic heart irregularities presently being evaluated by Dr. Fletcher Anon.  She was concerned having undergone whole breast radiation about the possibility of cardiac damage. I reviewed with her the fact that cardiac damage post breast radiation as of low incidence, and typically involves the blood vessels rather than the cardiac conduction system. She is concerned that she might have to discontinue her ADD medicines which she finds very beneficial.  She'll continue followup with the cardiology service as well as with her primary care physician. We'll plan for a reassessment in one year.     PCP: Ria Bush Ref MD: Dr. Charletta Cousin, [place] Gleason 06/18/2014, 8:31 PM

## 2014-06-16 NOTE — Patient Instructions (Signed)
Patient to return in 1 year. Continue self breast exams. Call office for any new breast issues or concerns.  

## 2014-06-17 ENCOUNTER — Encounter: Payer: Self-pay | Admitting: Cardiovascular Disease

## 2014-06-17 ENCOUNTER — Encounter (INDEPENDENT_AMBULATORY_CARE_PROVIDER_SITE_OTHER): Payer: Self-pay

## 2014-06-17 ENCOUNTER — Ambulatory Visit (INDEPENDENT_AMBULATORY_CARE_PROVIDER_SITE_OTHER): Payer: PRIVATE HEALTH INSURANCE | Admitting: Cardiovascular Disease

## 2014-06-17 VITALS — BP 101/68 | HR 68 | Ht 65.5 in | Wt 143.2 lb

## 2014-06-17 DIAGNOSIS — I4949 Other premature depolarization: Secondary | ICD-10-CM

## 2014-06-17 DIAGNOSIS — R002 Palpitations: Secondary | ICD-10-CM

## 2014-06-17 DIAGNOSIS — I493 Ventricular premature depolarization: Secondary | ICD-10-CM | POA: Insufficient documentation

## 2014-06-17 MED ORDER — METOPROLOL TARTRATE 25 MG PO TABS: 12.5000 mg | ORAL_TABLET | Freq: Two times a day (BID) | ORAL | Status: DC | PRN

## 2014-06-17 MED ORDER — METOPROLOL TARTRATE 25 MG PO TABS: 12.5000 mg | ORAL_TABLET | Freq: Two times a day (BID) | ORAL | Status: DC

## 2014-06-17 NOTE — Progress Notes (Signed)
Primary care physician: Dr. Danise Mina  HPI  This is a pleasant 48 year old female who is here today for followup visit regarding  Palpitations . 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 She was told in her early 32s that she had mitral valve prolapse. She had previous breast cancer in 2009 treated with chemotherapy and radiation therapy. She has known history of ADD and anxiety. She had issues with palpitations while on the ADD medications.  Echocardiogram showed normal LV systolic function with only mild aortic regurgitation. Holter monitor showed 2200 PVCs (1.25%) with rare PACs. She reports mild palpitations but improvement in other symptoms.  Allergies  Allergen Reactions  . Bupropion Hcl     REACTION: Hives  . Buspar [Buspirone Hcl] Nausea And Vomiting  . Other Other (See Comments)    Dissolving sutures  . Strattera [Atomoxetine Hcl] Other (See Comments)    Urinary trouble  . Latex Rash    Band aids/adhesives     Current Outpatient Prescriptions on File Prior to Visit  Medication Sig Dispense Refill  . aspirin 81 MG tablet Take 81 mg by mouth daily.      . Cholecalciferol (VITAMIN D-3) 5000 UNITS TABS Take 2 tablets by mouth daily.       Marland Kitchen dexmethylphenidate (FOCALIN) 10 MG tablet Take 5 mg by mouth daily.       Marland Kitchen dextroamphetamine (DEXEDRINE SPANSULE) 10 MG 24 hr capsule Take 5 mg by mouth 2 (two) times daily.       Mariane Baumgarten Calcium (STOOL SOFTENER PO) Take by mouth as needed.      . Nutritional Supplements (JUICE PLUS FIBRE) LIQD Take by mouth daily.        . Phosphatidylserine-DHA-EPA (VAYACOG) 100-19.5-6.5 MG CAPS Take 1 capsule by mouth daily.      . Probiotic Product (PROBIOTIC DAILY PO) Take by mouth daily.       No current facility-administered medications on file prior to visit.     Past Medical History  Diagnosis Date  . ADD (attention deficit disorder) 1990s  . Anxiety   . History of breast cancer 2009    Left side, lumpectomy, s/p sentinal LN biopsy,  chemo and radiation, remission  . Subclinical hypothyroidism   . Hemorrhoids   . Syncope and collapse   . Heart murmur   . Cancer 2009    breast     Past Surgical History  Procedure Laterality Date  . Bartholin's cystectomy    . Episiotomy    . Breast biopsy  05/07/08    Left (infitr ductal CA)- Dr. Bary Castilla  . Breast wide excision  05/27/08    Left-with sentinel node bx (Dr. Bary Castilla)  . Breast biopsy  06/25/13    left  . Cystoscopy       Family History  Problem Relation Age of Onset  . Drug abuse Father     Heroin  . Hypertension Mother   . Fibromyalgia Mother   . ADD / ADHD Mother   . Depression Mother   . Cancer Mother     cervical  . ADD / ADHD Brother   . Anxiety disorder Brother   . Stroke Maternal Grandmother   . Cancer Maternal Grandmother     breast  . Hypertension Paternal Grandmother   . Cancer Paternal Grandfather     lung  . Diabetes Neg Hx   . Coronary artery disease Neg Hx      History   Social History  . Marital Status: Married  Spouse Name: N/A    Number of Children: N/A  . Years of Education: N/A   Occupational History  . Social Worker    Social History Main Topics  . Smoking status: Never Smoker   . Smokeless tobacco: Never Used  . Alcohol Use: No  . Drug Use: No  . Sexual Activity: Not on file   Other Topics Concern  . Not on file   Social History Narrative   Caffeine: 1 cup coffee in am   Lives with husband and 1 son, daughter in college, 1 dog and 1 cat, 5-6 outside cats   Occupation: Camera operator job   Activity: no regular activity   Diet: joined [employer], good water, fruits/vegetables daily, red meat 3x/wk, fish 2x/wk      Psych- Dr. Wallis Mart in [place] ([place] [phone])     ROS A 10 point review of system was performed. It is negative other than that mentioned in the history of present illness.   PHYSICAL EXAM   BP 101/68  Pulse 68  Ht 5' 5.5" (1.664 m)  Wt 143 lb 4 oz (64.978 kg)  BMI 23.47  kg/m2  LMP 03/04/2014 Constitutional: She is oriented to person, place, and time. She appears well-developed and well-nourished. No distress.  HENT: No nasal discharge.  Head: Normocephalic and atraumatic.  Eyes: Pupils are equal and round. No discharge.  Neck: Normal range of motion. Neck supple. No JVD present. No thyromegaly present.  Cardiovascular: Normal rate, regular rhythm, normal heart sounds. Exam reveals no gallop and no friction rub. No murmur heard.  Pulmonary/Chest: Effort normal and breath sounds normal. No stridor. No respiratory distress. She has no wheezes. She has no rales. She exhibits no tenderness.  Abdominal: Soft. Bowel sounds are normal. She exhibits no distension. There is no tenderness. There is no rebound and no guarding.  Musculoskeletal: Normal range of motion. She exhibits no edema and no tenderness.  Neurological: She is alert and oriented to person, place, and time. Coordination normal.  Skin: Skin is warm and dry. No rash noted. She is not diaphoretic. No erythema. No pallor.  Psychiatric: She has a normal mood and affect. Her behavior is normal. Judgment and thought content normal.     EKG: Sinus  Rhythm  - occasional ectopic ventricular beat    Low voltage in precordial leads.   ABNORMAL    ASSESSMENT AND PLAN

## 2014-06-17 NOTE — Patient Instructions (Addendum)
You have mild amounts of PVCs. Heart function was normal.   Your physician has recommended you make the following change in your medication:  Start Metoprolol 12.5 mg twice daily as needed   Your physician wants you to follow-up in: 6 months. You will receive a reminder letter in the mail two months in advance. If you don't receive a letter, please call our office to schedule the follow-up appointment.

## 2014-06-17 NOTE — Assessment & Plan Note (Signed)
The patient's symptoms of palpitations seem to be due to PVCs. Echocardiogram showed no evidence of structural heart abnormalities and normal ejection fraction. The most likely etiology for her PVCs are ADD medications. The risks and benefits have to be balanced. It appears that she did not do well at all when she was off her ADD medications. It's probably best to provide her with small dose metoprolol to be used as needed. I explained to him that this condition is not life-threatening.

## 2014-06-18 ENCOUNTER — Encounter: Payer: Self-pay | Admitting: General Surgery

## 2014-06-20 ENCOUNTER — Encounter: Payer: Self-pay | Admitting: General Surgery

## 2014-07-23 ENCOUNTER — Ambulatory Visit: Payer: Self-pay | Admitting: Internal Medicine

## 2014-07-23 LAB — CREATININE, SERUM
Creatinine: 0.79 mg/dL (ref 0.60–1.30)
EGFR (African American): >60
EGFR (Non-African Amer.): >60

## 2014-07-23 LAB — HEPATIC FUNCTION PANEL A (ARMC)
ALK PHOS: 44 U/L — AB
Albumin: 4.1 g/dL (ref 3.4–5.0)
BILIRUBIN TOTAL: 0.4 mg/dL (ref 0.2–1.0)
Bilirubin, Direct: 0.1 mg/dL (ref 0.00–0.20)
SGOT(AST): 12 U/L — ABNORMAL LOW (ref 15–37)
SGPT (ALT): 29 U/L
Total Protein: 7 g/dL (ref 6.4–8.2)

## 2014-07-23 LAB — CBC CANCER CENTER
BASOS PCT: 0.5 %
Basophil #: 0 x10 3/mm (ref 0.0–0.1)
Eosinophil #: 0.1 x10 3/mm (ref 0.0–0.7)
Eosinophil %: 1.8 %
HCT: 42.1 % (ref 35.0–47.0)
HGB: 13.4 g/dL (ref 12.0–16.0)
LYMPHS PCT: 43.8 %
Lymphocyte #: 2.4 x10 3/mm (ref 1.0–3.6)
MCH: 29.4 pg (ref 26.0–34.0)
MCHC: 31.9 g/dL — ABNORMAL LOW (ref 32.0–36.0)
MCV: 92 fL (ref 80–100)
MONOS PCT: 9.2 %
Monocyte #: 0.5 x10 3/mm (ref 0.2–0.9)
NEUTROS ABS: 2.5 x10 3/mm (ref 1.4–6.5)
NEUTROS PCT: 44.7 %
Platelet: 237 x10 3/mm (ref 150–440)
RBC: 4.57 10*6/uL (ref 3.80–5.20)
RDW: 13.8 % (ref 11.5–14.5)
WBC: 5.5 x10 3/mm (ref 3.6–11.0)

## 2014-07-26 LAB — CANCER ANTIGEN 27.29: CA 27.29: 19.3 U/mL (ref 0.0–38.6)

## 2014-08-22 ENCOUNTER — Ambulatory Visit: Payer: Self-pay | Admitting: Internal Medicine

## 2014-08-23 ENCOUNTER — Encounter: Payer: Self-pay | Admitting: General Surgery

## 2015-02-04 ENCOUNTER — Other Ambulatory Visit: Payer: Self-pay | Admitting: Internal Medicine

## 2015-02-04 DIAGNOSIS — Z853 Personal history of malignant neoplasm of breast: Secondary | ICD-10-CM

## 2015-02-21 ENCOUNTER — Other Ambulatory Visit: Payer: PRIVATE HEALTH INSURANCE

## 2015-06-03 ENCOUNTER — Other Ambulatory Visit: Payer: Self-pay | Admitting: *Deleted

## 2015-06-03 DIAGNOSIS — C50412 Malignant neoplasm of upper-outer quadrant of left female breast: Secondary | ICD-10-CM

## 2015-06-13 ENCOUNTER — Ambulatory Visit: Payer: PRIVATE HEALTH INSURANCE

## 2015-06-13 ENCOUNTER — Ambulatory Visit
Admission: RE | Admit: 2015-06-13 | Discharge: 2015-06-13 | Disposition: A | Discharge status: Home / Self Care | Payer: PRIVATE HEALTH INSURANCE | Source: Ambulatory Visit | Attending: Internal Medicine | Admitting: Internal Medicine

## 2015-06-13 DIAGNOSIS — Z853 Personal history of malignant neoplasm of breast: Secondary | ICD-10-CM | POA: Diagnosis present

## 2015-06-22 ENCOUNTER — Ambulatory Visit: Payer: No Typology Code available for payment source | Admitting: General Surgery

## 2015-06-30 ENCOUNTER — Ambulatory Visit: Payer: No Typology Code available for payment source | Admitting: General Surgery

## 2015-07-06 ENCOUNTER — Encounter: Payer: Self-pay | Admitting: General Surgery

## 2015-07-06 ENCOUNTER — Ambulatory Visit (INDEPENDENT_AMBULATORY_CARE_PROVIDER_SITE_OTHER): Payer: PRIVATE HEALTH INSURANCE | Admitting: General Surgery

## 2015-07-06 VITALS — BP 116/70 | HR 82 | Resp 14 | Ht 65.5 in | Wt 144.8 lb

## 2015-07-06 DIAGNOSIS — Z853 Personal history of malignant neoplasm of breast: Secondary | ICD-10-CM | POA: Diagnosis not present

## 2015-07-06 NOTE — Progress Notes (Signed)
Patient ID: Susan Soto, female   DOB: 11-Jul-1966, 49 y.o.   MRN: [id number]  Chief Complaint  Patient presents with  . Follow-up    mammogram    HPI Susan Soto is a 49 y.o. female here today for a breast evaluation. The most recent mammogram was done on 06-13-15. Patient does not perform regular self breast checks but does gets regular mammograms done.  HPI  Past Medical History  Diagnosis Date  . ADD (attention deficit disorder) 1990s  . Anxiety   . History of breast cancer 2009    Left side, lumpectomy, s/p sentinal LN biopsy, chemo and radiation, remission  . Subclinical hypothyroidism   . Hemorrhoids   . Syncope and collapse   . Heart murmur   . 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 T1c, N0  . Breast cancer 2009    left, chem and radiation    Past Surgical History  Procedure Laterality Date  . Bartholin's cystectomy    . Episiotomy    . Breast wide excision  05/27/08    Left-with sentinel node bx (Dr. Bary Castilla)  . Cystoscopy    . Breast biopsy  05/07/08    Left (infitr ductal CA)- Dr. Bary Castilla  . Breast biopsy  06/25/13    left (benign) atrophic ductal epithelium, microcalcifications, changes of previous surgery.    Family History  Problem Relation Age of Onset  . Drug abuse Father     Heroin  . Hypertension Mother   . Fibromyalgia Mother   . ADD / ADHD Mother   . Depression Mother   . Cancer Mother     cervical  . ADD / ADHD Brother   . Anxiety disorder Brother   . Stroke Maternal Grandmother   . Cancer Maternal Grandmother     breast  . Breast cancer Maternal Grandmother   . Hypertension Paternal Grandmother   . Cancer Paternal Grandfather     lung  . Diabetes Neg Hx   . Coronary artery disease Neg Hx     Social History Social History  Substance Use Topics  . Smoking status: Never Smoker   . Smokeless tobacco: Never Used  . Alcohol  Use: No    Allergies  Allergen Reactions  . Bupropion Hcl     REACTION: Hives  . Buspar [Buspirone Hcl] Nausea And Vomiting  . Other Other (See Comments)    Dissolving sutures  . Strattera [Atomoxetine Hcl] Other (See Comments)    Urinary trouble  . Latex Rash    Band aids/adhesives    Current Outpatient Prescriptions  Medication Sig Dispense Refill  . aspirin 81 MG tablet Take 81 mg by mouth daily.    . Cholecalciferol (VITAMIN D-3) 5000 UNITS TABS Take 2 tablets by mouth daily.     Marland Kitchen dexmethylphenidate (FOCALIN) 10 MG tablet Take 5 mg by mouth daily.     Marland Kitchen dextroamphetamine (DEXEDRINE SPANSULE) 10 MG 24 hr capsule Take 5 mg by mouth 2 (two) times daily.     Mariane Baumgarten Calcium (STOOL SOFTENER PO) Take by mouth as needed.    . Nutritional Supplements (JUICE PLUS FIBRE) LIQD Take by mouth daily.      . Phosphatidylserine-DHA-EPA (VAYACOG) 100-19.5-6.5 MG CAPS Take 1 capsule by mouth daily.    . Probiotic Product (PROBIOTIC DAILY PO) Take by mouth daily.    . metoprolol tartrate (LOPRESSOR) 25 MG tablet Take 0.5  tablets (12.5 mg total) by mouth 2 (two) times daily as needed. (Patient not taking: Reported on 07/06/2015) 90 tablet 3   No current facility-administered medications for this visit.    Review of Systems Review of Systems  Constitutional: Negative.   Respiratory: Negative.   Cardiovascular: Negative.     Blood pressure 116/70, pulse 82, resp. rate 14, height 5' 5.5" (1.664 m), weight 144 lb 12.8 oz (65.681 kg), last menstrual period 03/04/2014.  Physical Exam Physical Exam  Constitutional: She is oriented to person, place, and time. She appears well-developed and well-nourished.  Neck: Normal range of motion. Neck supple.  Cardiovascular: Normal rate, regular rhythm, normal heart sounds and intact distal pulses.   Pulmonary/Chest: Effort normal and breath sounds normal.    Neurological: She is alert and oriented to person, place, and time. She has normal  reflexes.  Skin: Skin is warm and dry.  Psychiatric: Her behavior is normal.    Data Reviewed Bilateral diagnostic mammograms dated 06/13/2015 completed at [facility] were reviewed. 2014 biopsy clip in place. No interval change. BI-RADS-2.  Assessment    Doing well now 7 years status post breast conservation.    Plan    Her medical oncologist is leaving the area, and for that reason we'll have her return for her 8 year follow-up here with bilateral diagnostic mammograms at that time.    PCP: Selmer Dominion 07/07/2015, 1:52 PM

## 2015-07-07 ENCOUNTER — Encounter: Payer: Self-pay | Admitting: General Surgery

## 2015-07-22 ENCOUNTER — Inpatient Hospital Stay: Payer: PRIVATE HEALTH INSURANCE | Admitting: Internal Medicine

## 2015-07-22 ENCOUNTER — Inpatient Hospital Stay: Payer: PRIVATE HEALTH INSURANCE | Attending: Internal Medicine

## 2015-07-22 VITALS — BP 104/69 | HR 76 | Temp 96.9°F | Resp 18 | Ht 65.5 in | Wt 146.2 lb

## 2015-07-22 DIAGNOSIS — C50412 Malignant neoplasm of upper-outer quadrant of left female breast: Secondary | ICD-10-CM

## 2015-07-22 DIAGNOSIS — C50912 Malignant neoplasm of unspecified site of left female breast: Secondary | ICD-10-CM

## 2015-07-22 LAB — T4: THYROXINE (T4): 5.1

## 2015-07-22 LAB — VITAMIN D 25 HYDROXY (VIT D DEFICIENCY, FRACTURES): Vit D, 25-Hydroxy: 51

## 2015-07-22 LAB — HEPATIC FUNCTION PANEL
ALT: 15
AST: 13 U/L
Alkaline Phosphatase: 45 U/L
BILIRUBIN TOTAL: 0.4 mg/dL
Total Protein: 6.9 g/dL

## 2015-07-22 LAB — CREATININE, SERUM: CREATININE: 0.61

## 2015-07-22 LAB — TSH: TSH: 4.63

## 2015-07-22 LAB — CBC
HEMOGLOBIN: 13.2 g/dL
PLATELET COUNT: 265
WBC: 5.7

## 2015-07-22 LAB — CHG IMMUNOASSAY, TUMOR ANTIGEN, QUANT: CANCER ANTIGEN 125 (CA125): 17

## 2015-07-22 MED ORDER — INFLUENZA VAC SPLIT QUAD 0.5 ML IM SUSY: 0.5000 mL | PREFILLED_SYRINGE | Freq: Once | INTRAMUSCULAR | Status: DC

## 2015-07-22 NOTE — Progress Notes (Unsigned)
Patient is here for follow-up of breast cancer. She had mammogram in August 2016. She saw Dr. Bary Castilla on 9/16. She states that she has been very fatigued lately.

## 2015-07-26 ENCOUNTER — Telehealth: Payer: Self-pay | Admitting: *Deleted

## 2015-07-26 NOTE — Telephone Encounter (Signed)
Called and spoke to pt all labs normal except for TSH and her number was 4.63. The normal level 0.450-4.50.  Dr. Ma Hillock felt that is was nothing to worry about with the T4 being normal and we were going to fax these results to Dr. Danise Mina office at [phone].

## 2015-07-30 ENCOUNTER — Encounter: Payer: Self-pay | Admitting: Family Medicine

## 2015-07-31 NOTE — Progress Notes (Signed)
[place]  Telephone:([phone]) [phone] Fax:([phone]) [phone]     ID: Susan Soto OB: [birth year]  MR#: [id number]  YOV#:[id number]  Patient Care Team: Ria Bush, MD as PCP - General (Family Medicine) Robert Bellow, MD (General Surgery) Leia Alf, MD as Attending Physician (Internal Medicine)  CHIEF COMPLAINT/DIAGNOSIS:  1. 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   Primary tumor size 1.3 cm, grade 3.  Margins negative.  One sentinel lymph node negative for malignancy.   ER and PR negative.  HER-2/neu negative (1+ on IHC).  Patient received adjuvant chemotherapy (on clinical trial ECOG 5103, AC followed by paclitaxel plus Avastin/placebo).  Last dose of Paclitaxel was 11/15/08.  Last dose of Avastin/placebo was 06/10/09.  2. 06/25/13 - Biopsy of developing left breast calcification seen on mammogram reported negative for atypia/malignancy.   HPI:   Patient returns for continued oncology evaluation, she was last seen about 12 months ago. Clinically states that she is doing fairly well, denies any active complaints today. Denies any acute sickness since last visit here. She has lost some weight lately but this is intentional, states that she has been working with [employer], and she feels well overall. No new dyspnea or orthopnea. No new paresthesias in extremities. Denies feeling any new breast masses on self-exam.  No new mood disturbances. No new bone pains. No new headaches, imbalance or falls.  Review of Systems   As in HPI above. In addition, no fevers, chills or night sweats. No new headaches or focal weakness. No shortness of breath, hemoptysis or chest pain. No dizziness or palpitation. No abdominal pain, diarrhea. No dysuria or hematuria. No new bone pain. No skin rash or bleeding symptoms. No polyuria polydipsia. Performance status ECOG  0. ROS  PAST MEDICAL HISTORY: Reviewed. Past Medical History  Diagnosis Date  . ADD (attention deficit disorder) 1990s  . Anxiety   . History of breast cancer 2009    Left side, lumpectomy, s/p sentinal LN biopsy, chemo and radiation, remission  . Subclinical hypothyroidism   . Hemorrhoids   . Syncope and collapse   . Heart murmur   . 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 T1c, N0  . Breast cancer 2009    left, chem and radiation  Lumpectomy and sentinel node study for T1 N0 invasive duct carcinoma of the left breast on May 27, 2008.   PAST SURGICAL HISTORY: Reviewed. Past Surgical History  Procedure Laterality Date  . Bartholin's cystectomy    . Episiotomy    . Breast wide excision  05/27/08    Left-with sentinel node bx (Dr. Bary Castilla)  . Cystoscopy    . Breast biopsy  05/07/08    Left (infitr ductal CA)- Dr. Bary Castilla  . Breast biopsy  06/25/13    left (benign) atrophic ductal epithelium, microcalcifications, changes of previous surgery.    FAMILY HISTORY: Reviewed. Family History  Problem Relation Age of Onset  . Drug abuse Father     Heroin  . Hypertension Mother   . Fibromyalgia Mother   . ADD / ADHD Mother   . Depression Mother   . Cancer Mother     cervical  . ADD / ADHD Brother   . Anxiety disorder Brother   . Stroke Maternal Grandmother   . Cancer Maternal Grandmother  breast  . Breast cancer Maternal Grandmother   . Hypertension Paternal Grandmother   . Cancer Paternal Grandfather     lung  . Diabetes Neg Hx   . Coronary artery disease Neg Hx     SOCIAL HISTORY: Reviewed. Social History  Substance Use Topics  . Smoking status: Never Smoker   . Smokeless tobacco: Never Used  . Alcohol Use: No    Allergies  Allergen Reactions  . Bupropion Hcl     REACTION: Hives  . Buspar [Buspirone Hcl] Nausea And Vomiting  . Other Other (See  Comments)    Dissolving sutures  . Strattera [Atomoxetine Hcl] Other (See Comments)    Urinary trouble  . Latex Rash    Band aids/adhesives    Current Outpatient Prescriptions  Medication Sig Dispense Refill  . aspirin 81 MG tablet Take 81 mg by mouth daily.    . Cholecalciferol (VITAMIN D-3) 5000 UNITS TABS Take 2 tablets by mouth daily.     Marland Kitchen dexmethylphenidate (FOCALIN) 10 MG tablet Take 5 mg by mouth daily.     Marland Kitchen dextroamphetamine (DEXEDRINE SPANSULE) 10 MG 24 hr capsule Take 5 mg by mouth 2 (two) times daily.     Mariane Baumgarten Calcium (STOOL SOFTENER PO) Take by mouth as needed.    . metoprolol tartrate (LOPRESSOR) 25 MG tablet Take 0.5 tablets (12.5 mg total) by mouth 2 (two) times daily as needed. 90 tablet 3  . Nutritional Supplements (JUICE PLUS FIBRE) LIQD Take by mouth daily.      . Phosphatidylserine-DHA-EPA (VAYACOG) 100-19.5-6.5 MG CAPS Take 1 capsule by mouth daily.    . Probiotic Product (PROBIOTIC DAILY PO) Take by mouth daily.     Current Facility-Administered Medications  Medication Dose Route Frequency Provider Last Rate Last Dose  . Influenza vac split quadrivalent PF (FLUARIX) injection 0.5 mL  0.5 mL Intramuscular Once Leia Alf, MD        PHYSICAL EXAM: Filed Vitals:   07/22/15 1107  BP: 104/69  Pulse: 76  Temp: 96.9 F (36.1 C)  Resp: 18     Body mass index is 23.94 kg/(m^2).    ECOG FS:0 - Asymptomatic  GENERAL: Patient is alert and oriented and in no acute distress. There is no icterus. HEENT: EOMs intact. No cervical lymphadenopathy. CVS: S1S2, regular LUNGS: Bilaterally clear to auscultation, no rhonchi. ABDOMEN: Soft, nontender. No hepatomegaly clinically.  NEURO: grossly nonfocal, cranial nerves are intact. Gait unremarkable. EXTREMITIES: No pedal edema. BREASTS: no abnormal masses in either breast, scar tissue palpable in left breast.  No axillary adenopathy on either side.  Exam performed in the presence of a nurse   LAB RESULTS:     Component Value Date/Time   NA 141 02/22/2014 1203   NA 140 11/08/2010 0934   K 4.3 05/04/2014 1051   CL 101 02/22/2014 1203   CO2 24 02/22/2014 1203   GLUCOSE 84 02/22/2014 1203   GLUCOSE 87 11/08/2010 0934   BUN 11 02/22/2014 1203   BUN 13 11/08/2010 0934   CREATININE 0.79 07/23/2014 1024   CREATININE 0.57 02/22/2014 1203   CALCIUM 9.5 02/22/2014 1203   PROT 7.0 07/23/2014 1024   PROT 6.5 11/08/2010 0934   ALBUMIN 4.1 07/23/2014 1024   ALBUMIN 4.1 11/08/2010 0934   AST 12* 07/23/2014 1024   AST 18 11/08/2010 0934   ALT 29 07/23/2014 1024   ALT 21 11/08/2010 0934   ALKPHOS 44* 07/23/2014 1024   ALKPHOS 59 11/08/2010 0934   BILITOT 0.4 07/23/2014  1024   BILITOT 0.6 11/08/2010 0934   GFRNONAA >60 07/23/2014 1024   GFRNONAA 110 02/22/2014 1203   GFRNONAA >60 07/22/2013 0949   GFRAA >60 07/23/2014 1024   GFRAA 127 02/22/2014 1203   GFRAA >60 07/22/2013 0949    Lab Results  Component Value Date   WBC 5.5 07/23/2014   NEUTROABS 2.5 07/23/2014   HGB 13.4 07/23/2014   HCT 42.1 07/23/2014   MCV 92 07/23/2014   PLT 237 07/23/2014    STUDIES: 06/25/13 - biopsy of of developing left breast calcification seen on mammogram reported negative for atypia/malignancy.  07/23/14 - serum CA 27.29 level is 19.3.  06/13/15 - Mammogram. IMPRESSION:  No findings worrisome for recurrent tumor or developing malignancy. RECOMMENDATION: Bilateral diagnostic mammography in 1 year.  BI-RADS CATEGORY 1: Negative.   ASSESSMENT / PLAN:   History of  T1cN0M0 (clinical, stage I), triple-negative invasive carcinoma of the left breast s/p treatment as described above. Otherwise tumor is ER and PR negative, no role for adjuvant hormonal therapy   -  have reviewed labs from today and discussed with patient.  She continues to do well without any clinical evidence to suggest recurrent or metastatic breast cancer. She has had weight loss but states this is intentional. She had biopsy on 06/25/13 of  developing left breast calcification seen on mammogram reported negative for atypia/malignancy.  Recent surveillance Mammogram in August 2016 reported benign findings, BI-RADS 1. Patient did not get labs drawn today, states that she had recent labs at primary physician's office and wants to avoid today.  Plan is to continue surveillance clinically. Patient states that next mammogram will be scheduled by primary physician. Will otherwise see her back in 12 months with CBC, Cr, LFT, CA 27.29 level. In between visits, the patient has been advised to call or come to the ER in case of unintentional weight loss, new breast masses felt on self-exam or other new symptoms and will need to be evaluated sooner. She is agreeable to this plan.   Leia Alf, MD   07/31/2015 4:57 PM

## 2015-08-02 ENCOUNTER — Encounter: Payer: Self-pay | Admitting: *Deleted

## 2016-03-15 ENCOUNTER — Other Ambulatory Visit: Payer: Self-pay

## 2016-03-15 DIAGNOSIS — Z1231 Encounter for screening mammogram for malignant neoplasm of breast: Secondary | ICD-10-CM

## 2016-03-20 ENCOUNTER — Other Ambulatory Visit: Payer: Self-pay | Admitting: Student

## 2016-03-22 NOTE — Addendum Note (Signed)
Addended by: Lesly Rubenstein on: 03/22/2016 05:01 PM   Modules accepted: Orders

## 2016-06-12 ENCOUNTER — Other Ambulatory Visit: Payer: Self-pay | Admitting: General Surgery

## 2016-06-12 ENCOUNTER — Encounter: Payer: Self-pay | Admitting: *Deleted

## 2016-06-12 ENCOUNTER — Ambulatory Visit
Admission: RE | Admit: 2016-06-12 | Discharge: 2016-06-12 | Disposition: A | Discharge status: Home / Self Care | Payer: Managed Care, Other (non HMO) | Source: Ambulatory Visit | Attending: General Surgery | Admitting: General Surgery

## 2016-06-12 DIAGNOSIS — Z1231 Encounter for screening mammogram for malignant neoplasm of breast: Secondary | ICD-10-CM

## 2016-06-12 DIAGNOSIS — R928 Other abnormal and inconclusive findings on diagnostic imaging of breast: Secondary | ICD-10-CM | POA: Diagnosis not present

## 2016-06-13 ENCOUNTER — Other Ambulatory Visit: Payer: Self-pay | Admitting: General Surgery

## 2016-06-13 DIAGNOSIS — N6320 Unspecified lump in the left breast, unspecified quadrant: Secondary | ICD-10-CM

## 2016-06-15 ENCOUNTER — Telehealth: Payer: Self-pay | Admitting: *Deleted

## 2016-06-15 NOTE — Telephone Encounter (Signed)
Left message, patient needs to have additional views before she comes sees Dr.Byrnett on Monday 06/18/16. I canceled her appointment. She needs to call [facility] to get this scheduled

## 2016-06-18 ENCOUNTER — Telehealth: Payer: Self-pay | Admitting: General Surgery

## 2016-06-18 ENCOUNTER — Ambulatory Visit: Payer: PRIVATE HEALTH INSURANCE | Admitting: General Surgery

## 2016-06-18 ENCOUNTER — Ambulatory Visit
Admission: RE | Admit: 2016-06-18 | Discharge: 2016-06-18 | Disposition: A | Discharge status: Home / Self Care | Payer: Managed Care, Other (non HMO) | Source: Ambulatory Visit | Attending: General Surgery | Admitting: General Surgery

## 2016-06-18 DIAGNOSIS — R928 Other abnormal and inconclusive findings on diagnostic imaging of breast: Secondary | ICD-10-CM | POA: Insufficient documentation

## 2016-06-18 DIAGNOSIS — N6320 Unspecified lump in the left breast, unspecified quadrant: Secondary | ICD-10-CM

## 2016-06-18 DIAGNOSIS — N63 Unspecified lump in breast: Secondary | ICD-10-CM | POA: Diagnosis present

## 2016-06-18 NOTE — Telephone Encounter (Signed)
PT CALLED & STATED SHE HAD CALLED [facility] 3 TIMES & NO ONE HAD CALLED HER BACK.SHE HAS BEEN UNABLE TO [facility] HER ADDED VIEWS.I CALLED [facility]([phone])&SPOKE WITH JAMIE.SHE STATES THAT SAMANTHA SCHEDULES ADDED VIEWS & SHE IS OUT TODAY.SHE WOULD CALL THE PT & APOLOGIZE. I CALLED PT BACK & L/M REGAURDING THE ABOVE./MTH

## 2016-06-20 ENCOUNTER — Ambulatory Visit (INDEPENDENT_AMBULATORY_CARE_PROVIDER_SITE_OTHER): Payer: Managed Care, Other (non HMO) | Admitting: General Surgery

## 2016-06-20 ENCOUNTER — Encounter: Payer: Self-pay | Admitting: General Surgery

## 2016-06-20 VITALS — BP 118/70 | HR 80 | Resp 14 | Ht 67.0 in | Wt 148.0 lb

## 2016-06-20 DIAGNOSIS — Z853 Personal history of malignant neoplasm of breast: Secondary | ICD-10-CM | POA: Diagnosis not present

## 2016-06-20 NOTE — Progress Notes (Signed)
Patient ID: CHENIN BLAISDELL, female   DOB: June 27, 1966, 50 y.o.   MRN: [id number]  Chief Complaint  Patient presents with  . Follow-up    mammogram    HPI Susan Soto is a 50 y.o. female who presents for a breast evaluation. The most recent mammogram was done on 06/13/16 and added views on 06/18/16.    HPI  Past Medical History:  Diagnosis Date  . ADD (attention deficit disorder) 1990s  . Anxiety   . Breast cancer ([place]) 2009   left, chem and radiation  . Heart murmur   . Hemorrhoids   . History of breast cancer 2009   Left side, lumpectomy, s/p sentinal LN biopsy, chemo and radiation, remission  . 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 T1c, N0  . Subclinical hypothyroidism   . Syncope and collapse     Past Surgical History:  Procedure Laterality Date  . bartholin's cystectomy    . BREAST BIOPSY Left 05/07/08   Left (infitr ductal CA)- Dr. Bary Castilla  . BREAST BIOPSY Left 06/25/13   left (benign) atrophic ductal epithelium, microcalcifications, changes of previous surgery.  . breast wide excision  05/27/08   Left-with sentinel node bx (Dr. Bary Castilla)  . CYSTOSCOPY    . episiotomy      Family History  Problem Relation Age of Onset  . Drug abuse Father     Heroin  . Hypertension Mother   . Fibromyalgia Mother   . ADD / ADHD Mother   . Depression Mother   . Cancer Mother     cervical  . ADD / ADHD Brother   . Anxiety disorder Brother   . Stroke Maternal Grandmother   . Breast cancer Maternal Grandmother     50's  . Hypertension Paternal Grandmother   . Cancer Paternal Grandfather     lung  . Diabetes Neg Hx   . Coronary artery disease Neg Hx     Social History Social History  Substance Use Topics  . Smoking status: Never Smoker  . Smokeless tobacco: Never Used  . Alcohol use No    Allergies  Allergen Reactions  . Bupropion Hcl     REACTION:  Hives  . Buspar [Buspirone Hcl] Nausea And Vomiting  . Other Other (See Comments)    Dissolving sutures  . Strattera [Atomoxetine Hcl] Other (See Comments)    Urinary trouble  . Latex Rash    Band aids/adhesives    Current Outpatient Prescriptions  Medication Sig Dispense Refill  . ARIPiprazole (ABILIFY) 2 MG tablet Take 2 mg by mouth daily.    . Cholecalciferol (VITAMIN D-3) 5000 UNITS TABS Take 2 tablets by mouth daily.     Marland Kitchen dextroamphetamine (DEXEDRINE SPANSULE) 10 MG 24 hr capsule Take 5 mg by mouth 2 (two) times daily.     . diazepam (VALIUM) 2 MG tablet Take 2 mg by mouth every 6 (six) hours as needed for anxiety.    Mariane Baumgarten Calcium (STOOL SOFTENER PO) Take by mouth as needed.    . Probiotic Product (PROBIOTIC DAILY PO) Take by mouth daily.     No current facility-administered medications for this visit.    Facility-Administered Medications Ordered in Other Visits  Medication Dose Route Frequency Provider Last Rate Last Dose  . Influenza vac split quadrivalent PF (FLUARIX) injection 0.5 mL  0.5 mL Intramuscular Once Leia Alf, MD  Review of Systems Review of Systems  Constitutional: Negative.   Respiratory: Negative.   Cardiovascular: Negative.     Blood pressure 118/70, pulse 80, resp. rate 14, height 5\' 7"  (1.702 m), weight 148 lb (67.1 kg), last menstrual period 03/04/2014.  Physical Exam Physical Exam  Constitutional: She is oriented to person, place, and time. She appears well-developed and well-nourished.  Eyes: Conjunctivae are normal. No scleral icterus.  Neck: Neck supple.  Cardiovascular: Normal rate, regular rhythm and normal heart sounds.   Pulmonary/Chest: Effort normal and breath sounds normal. Right breast exhibits no inverted nipple, no mass, no nipple discharge, no skin change and no tenderness. Left breast exhibits no inverted nipple, no mass, no nipple discharge, no skin change and no tenderness.    Abdominal: Soft. Normal appearance  and bowel sounds are normal. There is no hepatomegaly. There is no tenderness.  Lymphadenopathy:    She has no cervical adenopathy.    She has no axillary adenopathy.  Neurological: She is alert and oriented to person, place, and time.  Skin: Skin is warm and dry.    Data Reviewed Bilateral mammogram dated 06/12/2016 was reviewed. Question will mass within the left breast. Additional views were requested. BI-RADS-0.  Focal spot compression views dated 06/18/2016 showed the area of concern in the upper-outer quadrant of left breast resolved. BI-RADS-1..    Assessment    Benign breast exam.  Modest breast asymmetry.  Patient reports she has had a colonoscopy in the past, mother previously had hyperplastic polyp.    Plan    The patient is considering plastic surgery evaluation for symmetry surgery. She was encouraged to investigate her options.   The patient has been asked to return to the office in one year with a bilateral screening mammogram.  This information has been scribed by Gaspar Cola CMA.    Susan Soto 06/22/2016, 12:07 PM

## 2016-06-20 NOTE — Patient Instructions (Addendum)
The patient is aware to call back for any questions or concerns.  The patient has been asked to return to the office in one year with a bilateral screening mammogram 

## 2016-06-22 ENCOUNTER — Encounter: Payer: Self-pay | Admitting: General Surgery

## 2016-06-22 ENCOUNTER — Telehealth: Payer: Self-pay | Admitting: General Surgery

## 2016-06-22 NOTE — Telephone Encounter (Signed)
I CALLED 06-22-16 @ 9:25AM & L/M FOR PT TO RETURN CALL.I WAS CHECKING TO SEE IF SHE HAD SCHEDULED HER ADDED VIEWS. IF SO WE JUST NEED TO SCHEDULE HER AN APPOINTMENT WITH DR Franchot Gallo

## 2016-06-22 NOTE — Telephone Encounter (Signed)
PT HAS HAD ADDED VIEWS & WAS SEEN BY DR BYRNETT ON 06-20-16./MTH

## 2016-06-26 ENCOUNTER — Telehealth: Payer: Self-pay | Admitting: Family Medicine

## 2016-06-26 DIAGNOSIS — Z131 Encounter for screening for diabetes mellitus: Secondary | ICD-10-CM

## 2016-06-26 NOTE — Telephone Encounter (Signed)
Pt would like to have orders for labs printed so that she can go to [place]. She will be in the office Wednesday to pick up.

## 2016-06-27 ENCOUNTER — Other Ambulatory Visit: Payer: Self-pay | Admitting: Family Medicine

## 2016-06-27 NOTE — Telephone Encounter (Signed)
Rx written and in Kim's box. 

## 2016-06-27 NOTE — Telephone Encounter (Signed)
Placed up front for pickup ° °

## 2016-06-28 ENCOUNTER — Encounter: Payer: Self-pay | Admitting: Family Medicine

## 2016-06-28 LAB — BASIC METABOLIC PANEL
BUN / CREAT RATIO: 20 (ref 9–23)
BUN: 12 mg/dL (ref 6–24)
CHLORIDE: 99 mmol/L (ref 96–106)
CO2: 27 mmol/L (ref 18–29)
Calcium: 9.9 mg/dL (ref 8.7–10.2)
Creatinine, Ser: 0.6 mg/dL (ref 0.57–1.00)
GFR calc non Af Amer: 107 mL/min/{1.73_m2} (ref >59)
GFR, EST AFRICAN AMERICAN: 123 mL/min/{1.73_m2} (ref >59)
GLUCOSE: 85 mg/dL (ref 65–99)
POTASSIUM: 4.4 mmol/L (ref 3.5–5.2)
SODIUM: 142 mmol/L (ref 134–144)

## 2016-06-28 LAB — TSH: TSH: 5.63 u[IU]/mL — AB (ref 0.450–4.500)

## 2016-06-28 LAB — VITAMIN D 25 HYDROXY (VIT D DEFICIENCY, FRACTURES): Vit D, 25-Hydroxy: 36.1 ng/mL (ref 30.0–100.0)

## 2016-07-06 ENCOUNTER — Encounter: Payer: Self-pay | Admitting: Family Medicine

## 2016-07-06 ENCOUNTER — Ambulatory Visit (INDEPENDENT_AMBULATORY_CARE_PROVIDER_SITE_OTHER): Payer: Managed Care, Other (non HMO) | Admitting: Family Medicine

## 2016-07-06 VITALS — BP 118/80 | HR 56 | Temp 97.3°F | Ht 65.5 in | Wt 148.0 lb

## 2016-07-06 DIAGNOSIS — F411 Generalized anxiety disorder: Secondary | ICD-10-CM

## 2016-07-06 DIAGNOSIS — Z23 Encounter for immunization: Secondary | ICD-10-CM

## 2016-07-06 DIAGNOSIS — E559 Vitamin D deficiency, unspecified: Secondary | ICD-10-CM

## 2016-07-06 DIAGNOSIS — Z0000 Encounter for general adult medical examination without abnormal findings: Secondary | ICD-10-CM | POA: Diagnosis not present

## 2016-07-06 DIAGNOSIS — R5382 Chronic fatigue, unspecified: Secondary | ICD-10-CM

## 2016-07-06 DIAGNOSIS — E039 Hypothyroidism, unspecified: Secondary | ICD-10-CM

## 2016-07-06 DIAGNOSIS — E038 Other specified hypothyroidism: Secondary | ICD-10-CM

## 2016-07-06 DIAGNOSIS — F900 Attention-deficit hyperactivity disorder, predominantly inattentive type: Secondary | ICD-10-CM

## 2016-07-06 LAB — HM PAP SMEAR: HM Pap smear: NORMAL

## 2016-07-06 MED ORDER — VALACYCLOVIR HCL 1 G PO TABS: 2000.0000 mg | ORAL_TABLET | Freq: Two times a day (BID) | ORAL | 3 refills | Status: DC

## 2016-07-06 NOTE — Assessment & Plan Note (Signed)
Preventative protocols reviewed and updated unless pt declined. Discussed healthy diet and lifestyle.  

## 2016-07-06 NOTE — Progress Notes (Signed)
BP 118/80 (BP Location: Left Arm, Patient Position: Sitting, Cuff Size: Normal)   Pulse (!) 56   Temp 97.3 F (36.3 C) (Oral)   Ht 5' 5.5" (1.664 m)   Wt 148 lb (67.1 kg)   LMP 03/04/2014   SpO2 99%   BMI 24.25 kg/m    CC: CPE Subjective:    Patient ID: Susan Soto, female    DOB: 1966-08-30, 50 y.o.   MRN: [id number]  HPI: Susan Soto is a 50 y.o. female presenting on 07/06/2016 for Annual Exam   Last seen here 04/2014. H/o ADD. Has seen psychiatrist - Dr. Wallis Mart [facility] in [place]). Rough summer. On FMLA for past 2 months for stress, PTSD, depression, anxiety. Also sees EAP counselor weekly. Told by psychiatrist goal vit D 50-100, goal TSH <1. Brings request from psychiatry Dr Wallis Mart to check TSH, free T4, free T3, RT3, T3 uptake, TPO and antithyroid antibodies, as well as zinc, copper and histamine.   She states she has removed all iodinated salt from diet, asks about iodine deficiency causing thyroid disease. H/o radiation therapy for breast cancer.   Increased stress at work - works at [employer]   Requests valtrex refill sent to pharmacy.   Preventative: LMP - 02/2014. Postmenopausal Colon cancer screening - colonoscopy 2010 ([place]) WNL per patient. Report requested today.  Well woman due - normal pap 05/2013. fmhx cervical cancer.  History of L breast cancer s/p radiation and chemotherapy - yearly mammograms, sees surgery and onc yearly. Flu today Tdap around 2013 Seat belt use discussed. Sunscreen use discussed.  No changing moles.   Hot flashes - not interested in antidepressants and avoids estrogen products 2/2 h/o breast cancer. Hot flashes have gotten better.  Caffeine: 1 cup coffee in am Lives with husband and 1 son, daughter in college, 1 dog and 1 cat, 5-6 outside cats Occupation: Camera operator job Activity: no regular activity Diet: joined [employer], good water, fruits/vegetables daily, red meat 3x/wk, fish 2x/wk, more  stevia and less regular sugar. Switched from iodinated salt to sea salt.   Relevant past medical, surgical, family and social history reviewed and updated as indicated. Interim medical history since our last visit reviewed. Allergies and medications reviewed and updated. Current Outpatient Prescriptions on File Prior to Visit  Medication Sig  . ARIPiprazole (ABILIFY) 2 MG tablet Take 1 mg by mouth daily.   . Cholecalciferol (VITAMIN D-3) 5000 UNITS TABS Take 2 tablets by mouth daily.   . diazepam (VALIUM) 2 MG tablet Take 2 mg by mouth every 12 (twelve) hours as needed for anxiety.   . Probiotic Product (PROBIOTIC DAILY PO) Take by mouth daily.  Mariane Baumgarten Calcium (STOOL SOFTENER PO) Take by mouth as needed.   Current Facility-Administered Medications on File Prior to Visit  Medication  . Influenza vac split quadrivalent PF (FLUARIX) injection 0.5 mL    Review of Systems  Constitutional: Positive for appetite change (decreased). Negative for activity change, chills, fatigue, fever and unexpected weight change.  HENT: Negative for hearing loss.   Eyes: Negative for visual disturbance.  Respiratory: Negative for cough, chest tightness, shortness of breath and wheezing.   Cardiovascular: Positive for palpitations (intermittent). Negative for chest pain and leg swelling.  Gastrointestinal: Negative for abdominal distention, abdominal pain, blood in stool, constipation, diarrhea, nausea and vomiting.  Genitourinary: Negative for difficulty urinating and hematuria.  Musculoskeletal: Negative for arthralgias, myalgias and neck pain.  Skin: Negative for rash.  Neurological: Positive  for dizziness (mild). Negative for seizures, syncope and headaches.  Hematological: Negative for adenopathy. Bruises/bleeds easily.  Psychiatric/Behavioral: Positive for dysphoric mood. The patient is nervous/anxious.    Per HPI unless specifically indicated in ROS section     Objective:    BP 118/80 (BP  Location: Left Arm, Patient Position: Sitting, Cuff Size: Normal)   Pulse (!) 56   Temp 97.3 F (36.3 C) (Oral)   Ht 5' 5.5" (1.664 m)   Wt 148 lb (67.1 kg)   LMP 03/04/2014   SpO2 99%   BMI 24.25 kg/m   Wt Readings from Last 3 Encounters:  07/06/16 148 lb (67.1 kg)  06/20/16 148 lb (67.1 kg)  07/22/15 146 lb 2.6 oz (66.3 kg)    Physical Exam  Constitutional: She is oriented to person, place, and time. She appears well-developed and well-nourished. No distress.  HENT:  Head: Normocephalic and atraumatic.  Right Ear: Hearing, tympanic membrane, external ear and ear canal normal.  Left Ear: Hearing, tympanic membrane, external ear and ear canal normal.  Nose: Nose normal.  Mouth/Throat: Uvula is midline, oropharynx is clear and moist and mucous membranes are normal. No oropharyngeal exudate, posterior oropharyngeal edema or posterior oropharyngeal erythema.  Eyes: Conjunctivae and EOM are normal. Pupils are equal, round, and reactive to light. No scleral icterus.  Neck: Normal range of motion. Neck supple. Thyromegaly (mild L sided without nodules) present.  Cardiovascular: Normal rate, regular rhythm, normal heart sounds and intact distal pulses.   No murmur heard. Pulses:      Radial pulses are 2+ on the right side, and 2+ on the left side.  Pulmonary/Chest: Effort normal and breath sounds normal. No respiratory distress. She has no wheezes. She has no rales.  Abdominal: Soft. Bowel sounds are normal. She exhibits no distension and no mass. There is no tenderness. There is no rebound and no guarding.  Genitourinary: Vagina normal and uterus normal. Pelvic exam was performed with patient supine. There is no rash, tenderness or lesion on the right labia. There is no rash, tenderness or lesion on the left labia. Cervix exhibits no motion tenderness, no discharge and no friability. Right adnexum displays no mass, no tenderness and no fullness. Left adnexum displays no mass, no tenderness  and no fullness.  Musculoskeletal: Normal range of motion. She exhibits no edema.  Lymphadenopathy:    She has no cervical adenopathy.  Neurological: She is alert and oriented to person, place, and time.  CN grossly intact, station and gait intact  Skin: Skin is warm and dry. No rash noted.  Psychiatric: She has a normal mood and affect. Her behavior is normal. Judgment and thought content normal.  Nursing note and vitals reviewed.  Results for orders placed or performed in visit on [id number]  Basic metabolic panel  Result Value Ref Range   Glucose 85 65 - 99 mg/dL   BUN 12 6 - 24 mg/dL   Creatinine, Ser 0.60 0.57 - 1.00 mg/dL   GFR calc non Af Amer 107 >59 mL/min/1.73   GFR calc Af Amer 123 >59 mL/min/1.73   BUN/Creatinine Ratio 20 9 - 23   Sodium 142 134 - 144 mmol/L   Potassium 4.4 3.5 - 5.2 mmol/L   Chloride 99 96 - 106 mmol/L   CO2 27 18 - 29 mmol/L   Calcium 9.9 8.7 - 10.2 mg/dL  TSH  Result Value Ref Range   TSH 5.630 (H) 0.450 - 4.500 uIU/mL  VITAMIN D 25 Hydroxy (Vit-D Deficiency, Fractures)  Result Value Ref Range   Vit D, 25-Hydroxy 36.1 30.0 - 100.0 ng/mL      Assessment & Plan:  Leanna Battles [id number] Problem List Items Addressed This Visit    ADD (attention deficit hyperactivity disorder, inattentive type)    Continue f/u with psych      Chronic fatigue    Further labs today per psychiatrist request. Will forward labs to Dr Wallis Mart.  I was unable to order serum histamine levels - I only see available 24 hour urine collection for histamine - so did not order this.       Relevant Orders   Zinc   Copper, Serum   GAD (generalized anxiety disorder)    Continue f/u with psych      Healthcare maintenance - Primary    Preventative protocols reviewed and updated unless pt declined. Discussed healthy diet and lifestyle.       Relevant Orders   Pap LB (liquid-based)   Subclinical hypothyroidism    Psych requests further full TFTs which were  ordered today. Psych requests TSH goal <1.  I will also check random urine iodine level.  I will try and touch base with psychiatry.  H/o radiation therapy.       Relevant Orders   Iodine, Random Urine   TSH   T3   T4, free   T3, reverse   T3 Uptake   Thyroid peroxidase antibody   Thyrotropin receptor autoabs   Vitamin D deficiency    Pt had been off vitamin D - will restart. Psych requests goal vit D levels 50-100.        Other Visit Diagnoses    Need for influenza vaccination       Relevant Orders   Flu Vaccine QUAD 36+ mos PF IM (Fluarix & Fluzone Quad PF) (Completed)       Follow up plan: Return in about 1 year (around 07/06/2017) for annual exam, prior fasting for blood work.  Ria Bush, MD

## 2016-07-06 NOTE — Patient Instructions (Addendum)
Flu shot today Labs and urine today Sign release for colonoscopy records from Dr Candace Cruise We will fax results to Dr Wallis Mart.   Health Maintenance, Female Adopting a healthy lifestyle and getting preventive care can go a long way to promote health and wellness. Talk with your health care provider about what schedule of regular examinations is right for you. This is a good chance for you to check in with your provider about disease prevention and staying healthy. In between checkups, there are plenty of things you can do on your own. Experts have done a lot of research about which lifestyle changes and preventive measures are most likely to keep you healthy. Ask your health care provider for more information. WEIGHT AND DIET  Eat a healthy diet  Be sure to include plenty of vegetables, fruits, low-fat dairy products, and lean protein.  Do not eat a lot of foods high in solid fats, added sugars, or salt.  Get regular exercise. This is one of the most important things you can do for your health.  Most adults should exercise for at least 150 minutes each week. The exercise should increase your heart rate and make you sweat (moderate-intensity exercise).  Most adults should also do strengthening exercises at least twice a week. This is in addition to the moderate-intensity exercise.  Maintain a healthy weight  Body mass index (BMI) is a measurement that can be used to identify possible weight problems. It estimates body fat based on height and weight. Your health care provider can help determine your BMI and help you achieve or maintain a healthy weight.  For females 7 years of age and older:   A BMI below 18.5 is considered underweight.  A BMI of 18.5 to 24.9 is normal.  A BMI of 25 to 29.9 is considered overweight.  A BMI of 30 and above is considered obese.  Watch levels of cholesterol and blood lipids  You should start having your blood tested for lipids and cholesterol at 51 years  of age, then have this test every 5 years.  You may need to have your cholesterol levels checked more often if:  Your lipid or cholesterol levels are high.  You are older than 51 years of age.  You are at high risk for heart disease.  CANCER SCREENING   Lung Cancer  Lung cancer screening is recommended for adults 1-19 years old who are at high risk for lung cancer because of a history of smoking.  A yearly low-dose CT scan of the lungs is recommended for people who:  Currently smoke.  Have quit within the past 15 years.  Have at least a 30-pack-year history of smoking. A pack year is smoking an average of one pack of cigarettes a day for 1 year.  Yearly screening should continue until it has been 15 years since you quit.  Yearly screening should stop if you develop a health problem that would prevent you from having lung cancer treatment.  Breast Cancer  Practice breast self-awareness. This means understanding how your breasts normally appear and feel.  It also means doing regular breast self-exams. Let your health care provider know about any changes, no matter how small.  If you are in your 20s or 30s, you should have a clinical breast exam (CBE) by a health care provider every 1-3 years as part of a regular health exam.  If you are 47 or older, have a CBE every year. Also consider having a breast X-ray (mammogram)  every year.  If you have a family history of breast cancer, talk to your health care provider about genetic screening.  If you are at high risk for breast cancer, talk to your health care provider about having an MRI and a mammogram every year.  Breast cancer gene (BRCA) assessment is recommended for women who have family members with BRCA-related cancers. BRCA-related cancers include:  Breast.  Ovarian.  Tubal.  Peritoneal cancers.  Results of the assessment will determine the need for genetic counseling and BRCA1 and BRCA2 testing. Cervical  Cancer Your health care provider may recommend that you be screened regularly for cancer of the pelvic organs (ovaries, uterus, and vagina). This screening involves a pelvic examination, including checking for microscopic changes to the surface of your cervix (Pap test). You may be encouraged to have this screening done every 3 years, beginning at age 71.  For women ages 27-65, health care providers may recommend pelvic exams and Pap testing every 3 years, or they may recommend the Pap and pelvic exam, combined with testing for human papilloma virus (HPV), every 5 years. Some types of HPV increase your risk of cervical cancer. Testing for HPV may also be done on women of any age with unclear Pap test results.  Other health care providers may not recommend any screening for nonpregnant women who are considered low risk for pelvic cancer and who do not have symptoms. Ask your health care provider if a screening pelvic exam is right for you.  If you have had past treatment for cervical cancer or a condition that could lead to cancer, you need Pap tests and screening for cancer for at least 20 years after your treatment. If Pap tests have been discontinued, your risk factors (such as having a new sexual partner) need to be reassessed to determine if screening should resume. Some women have medical problems that increase the chance of getting cervical cancer. In these cases, your health care provider may recommend more frequent screening and Pap tests. Colorectal Cancer  This type of cancer can be detected and often prevented.  Routine colorectal cancer screening usually begins at 51 years of age and continues through 51 years of age.  Your health care provider may recommend screening at an earlier age if you have risk factors for colon cancer.  Your health care provider may also recommend using home test kits to check for hidden blood in the stool.  A small camera at the end of a tube can be used to  examine your colon directly (sigmoidoscopy or colonoscopy). This is done to check for the earliest forms of colorectal cancer.  Routine screening usually begins at age 79.  Direct examination of the colon should be repeated every 5-10 years through 51 years of age. However, you may need to be screened more often if early forms of precancerous polyps or small growths are found. Skin Cancer  Check your skin from head to toe regularly.  Tell your health care provider about any new moles or changes in moles, especially if there is a change in a mole's shape or color.  Also tell your health care provider if you have a mole that is larger than the size of a pencil eraser.  Always use sunscreen. Apply sunscreen liberally and repeatedly throughout the day.  Protect yourself by wearing long sleeves, pants, a wide-brimmed hat, and sunglasses whenever you are outside. HEART DISEASE, DIABETES, AND HIGH BLOOD PRESSURE   High blood pressure causes heart disease and  increases the risk of stroke. High blood pressure is more likely to develop in:  People who have blood pressure in the high end of the normal range (130-139/85-89 mm Hg).  People who are overweight or obese.  People who are African American.  If you are 61-12 years of age, have your blood pressure checked every 3-5 years. If you are 49 years of age or older, have your blood pressure checked every year. You should have your blood pressure measured twice--once when you are at a hospital or clinic, and once when you are not at a hospital or clinic. Record the average of the two measurements. To check your blood pressure when you are not at a hospital or clinic, you can use:  An automated blood pressure machine at a pharmacy.  A home blood pressure monitor.  If you are between 72 years and 80 years old, ask your health care provider if you should take aspirin to prevent strokes.  Have regular diabetes screenings. This involves taking a  blood sample to check your fasting blood sugar level.  If you are at a normal weight and have a low risk for diabetes, have this test once every three years after 51 years of age.  If you are overweight and have a high risk for diabetes, consider being tested at a younger age or more often. PREVENTING INFECTION  Hepatitis B  If you have a higher risk for hepatitis B, you should be screened for this virus. You are considered at high risk for hepatitis B if:  You were born in a country where hepatitis B is common. Ask your health care provider which countries are considered high risk.  Your parents were born in a high-risk country, and you have not been immunized against hepatitis B (hepatitis B vaccine).  You have HIV or AIDS.  You use needles to inject street drugs.  You live with someone who has hepatitis B.  You have had sex with someone who has hepatitis B.  You get hemodialysis treatment.  You take certain medicines for conditions, including cancer, organ transplantation, and autoimmune conditions. Hepatitis C  Blood testing is recommended for:  Everyone born from [birth year] through 1966.  Anyone with known risk factors for hepatitis C. Sexually transmitted infections (STIs)  You should be screened for sexually transmitted infections (STIs) including gonorrhea and chlamydia if:  You are sexually active and are younger than 51 years of age.  You are older than 51 years of age and your health care provider tells you that you are at risk for this type of infection.  Your sexual activity has changed since you were last screened and you are at an increased risk for chlamydia or gonorrhea. Ask your health care provider if you are at risk.  If you do not have HIV, but are at risk, it may be recommended that you take a prescription medicine daily to prevent HIV infection. This is called pre-exposure prophylaxis (PrEP). You are considered at risk if:  You are sexually active and do  not regularly use condoms or know the HIV status of your partner(s).  You take drugs by injection.  You are sexually active with a partner who has HIV. Talk with your health care provider about whether you are at high risk of being infected with HIV. If you choose to begin PrEP, you should first be tested for HIV. You should then be tested every 3 months for as long as you are taking PrEP.  PREGNANCY   If you are premenopausal and you may become pregnant, ask your health care provider about preconception counseling.  If you may become pregnant, take 400 to 800 micrograms (mcg) of folic acid every day.  If you want to prevent pregnancy, talk to your health care provider about birth control (contraception). OSTEOPOROSIS AND MENOPAUSE   Osteoporosis is a disease in which the bones lose minerals and strength with aging. This can result in serious bone fractures. Your risk for osteoporosis can be identified using a bone density scan.  If you are 49 years of age or older, or if you are at risk for osteoporosis and fractures, ask your health care provider if you should be screened.  Ask your health care provider whether you should take a calcium or vitamin D supplement to lower your risk for osteoporosis.  Menopause may have certain physical symptoms and risks.  Hormone replacement therapy may reduce some of these symptoms and risks. Talk to your health care provider about whether hormone replacement therapy is right for you.  HOME CARE INSTRUCTIONS   Schedule regular health, dental, and eye exams.  Stay current with your immunizations.   Do not use any tobacco products including cigarettes, chewing tobacco, or electronic cigarettes.  If you are pregnant, do not drink alcohol.  If you are breastfeeding, limit how much and how often you drink alcohol.  Limit alcohol intake to no more than 1 drink per day for nonpregnant women. One drink equals 12 ounces of beer, 5 ounces of wine, or 1  ounces of hard liquor.  Do not use street drugs.  Do not share needles.  Ask your health care provider for help if you need support or information about quitting drugs.  Tell your health care provider if you often feel depressed.  Tell your health care provider if you have ever been abused or do not feel safe at home.   This information is not intended to replace advice given to you by your health care provider. Make sure you discuss any questions you have with your health care provider.   Document Released: 04/23/2011 Document Revised: 10/29/2014 Document Reviewed: 09/09/2013 Elsevier Interactive Patient Education [employer].

## 2016-07-07 NOTE — Assessment & Plan Note (Addendum)
Psych requests further full TFTs which were ordered today. Psych requests TSH goal <1.  I will also check random urine iodine level.  I will try and touch base with psychiatry.  H/o radiation therapy.

## 2016-07-07 NOTE — Assessment & Plan Note (Addendum)
Pt had been off vitamin D - will restart. Psych requests goal vit D levels 50-100.

## 2016-07-07 NOTE — Assessment & Plan Note (Signed)
Continue f/u with psych.  

## 2016-07-07 NOTE — Assessment & Plan Note (Addendum)
Further labs today per psychiatrist request. Will forward labs to Dr Wallis Mart.  I was unable to order serum histamine levels - I only see available 24 hour urine collection for histamine - so did not order this.

## 2016-07-10 LAB — PAP LB (LIQUID-BASED): PAP SMEAR COMMENT: 0

## 2016-07-11 LAB — THYROID PEROXIDASE ANTIBODY: Thyroperoxidase Ab SerPl-aCnc: 11 IU/mL (ref 0–34)

## 2016-07-11 LAB — T3 UPTAKE: T3 Uptake Ratio: 26 % (ref 24–39)

## 2016-07-11 LAB — IODINE, RANDOM URINE: IODINE, URINE: 253.1 ug/L (ref 28.0–544.0)

## 2016-07-11 LAB — TSH: TSH: 3.83 u[IU]/mL (ref 0.450–4.500)

## 2016-07-11 LAB — T4, FREE: FREE T4: 0.99 ng/dL (ref 0.82–1.77)

## 2016-07-11 LAB — T3, REVERSE: Reverse T3, Serum: 17.8 ng/dL (ref 9.2–24.1)

## 2016-07-11 LAB — THYROTROPIN RECEPTOR AUTOABS: Thyrotropin Receptor Ab: <0.5 IU/L (ref 0.00–1.75)

## 2016-07-11 LAB — ZINC: Zinc: 83 ug/dL (ref 56–134)

## 2016-07-11 LAB — COPPER, SERUM: Copper: 104 ug/dL (ref 72–166)

## 2016-07-11 LAB — T3: T3 TOTAL: 107 ng/dL (ref 71–180)

## 2016-07-13 ENCOUNTER — Encounter: Payer: Self-pay | Admitting: *Deleted

## 2016-07-16 ENCOUNTER — Other Ambulatory Visit: Payer: Self-pay

## 2016-07-16 DIAGNOSIS — C50412 Malignant neoplasm of upper-outer quadrant of left female breast: Secondary | ICD-10-CM

## 2016-07-19 ENCOUNTER — Other Ambulatory Visit: Payer: Self-pay | Admitting: *Deleted

## 2016-07-20 ENCOUNTER — Inpatient Hospital Stay: Payer: Managed Care, Other (non HMO) | Admitting: Internal Medicine

## 2016-07-20 ENCOUNTER — Inpatient Hospital Stay: Payer: Managed Care, Other (non HMO)

## 2016-08-06 ENCOUNTER — Encounter: Payer: Self-pay | Admitting: Family Medicine

## 2016-08-10 ENCOUNTER — Inpatient Hospital Stay: Payer: Managed Care, Other (non HMO) | Attending: Internal Medicine | Admitting: Internal Medicine

## 2016-08-10 ENCOUNTER — Other Ambulatory Visit: Payer: Self-pay

## 2016-08-10 ENCOUNTER — Inpatient Hospital Stay: Payer: Managed Care, Other (non HMO)

## 2016-08-10 VITALS — BP 107/73 | HR 76 | Temp 97.1°F | Resp 18 | Wt 151.4 lb

## 2016-08-10 DIAGNOSIS — Z801 Family history of malignant neoplasm of trachea, bronchus and lung: Secondary | ICD-10-CM | POA: Diagnosis not present

## 2016-08-10 DIAGNOSIS — C50412 Malignant neoplasm of upper-outer quadrant of left female breast: Secondary | ICD-10-CM

## 2016-08-10 DIAGNOSIS — Z171 Estrogen receptor negative status [ER-]: Principal | ICD-10-CM

## 2016-08-10 DIAGNOSIS — Z853 Personal history of malignant neoplasm of breast: Secondary | ICD-10-CM | POA: Diagnosis not present

## 2016-08-10 DIAGNOSIS — Z803 Family history of malignant neoplasm of breast: Secondary | ICD-10-CM | POA: Diagnosis not present

## 2016-08-10 DIAGNOSIS — Z79899 Other long term (current) drug therapy: Secondary | ICD-10-CM | POA: Diagnosis not present

## 2016-08-10 DIAGNOSIS — Z923 Personal history of irradiation: Secondary | ICD-10-CM | POA: Insufficient documentation

## 2016-08-10 DIAGNOSIS — R011 Cardiac murmur, unspecified: Secondary | ICD-10-CM | POA: Insufficient documentation

## 2016-08-10 DIAGNOSIS — Z9221 Personal history of antineoplastic chemotherapy: Secondary | ICD-10-CM | POA: Insufficient documentation

## 2016-08-10 DIAGNOSIS — E039 Hypothyroidism, unspecified: Secondary | ICD-10-CM | POA: Insufficient documentation

## 2016-08-10 DIAGNOSIS — F419 Anxiety disorder, unspecified: Secondary | ICD-10-CM | POA: Insufficient documentation

## 2016-08-10 LAB — CBC WITH DIFFERENTIAL/PLATELET
BASOS ABS: 0 10*3/uL (ref 0–0.1)
BASOS PCT: 1 %
EOS PCT: 2 %
Eosinophils Absolute: 0.1 10*3/uL (ref 0–0.7)
HCT: 40.4 % (ref 35.0–47.0)
Hemoglobin: 13.8 g/dL (ref 12.0–16.0)
Lymphocytes Relative: 41 %
Lymphs Abs: 2.4 10*3/uL (ref 1.0–3.6)
MCH: 30.6 pg (ref 26.0–34.0)
MCHC: 34.2 g/dL (ref 32.0–36.0)
MCV: 89.3 fL (ref 80.0–100.0)
MONO ABS: 0.7 10*3/uL (ref 0.2–0.9)
Monocytes Relative: 11 %
Neutro Abs: 2.7 10*3/uL (ref 1.4–6.5)
Neutrophils Relative %: 45 %
PLATELETS: 247 10*3/uL (ref 150–440)
RBC: 4.53 MIL/uL (ref 3.80–5.20)
RDW: 14.1 % (ref 11.5–14.5)
WBC: 5.9 10*3/uL (ref 3.6–11.0)

## 2016-08-10 LAB — COMPREHENSIVE METABOLIC PANEL
ALBUMIN: 4.8 g/dL (ref 3.5–5.0)
ALT: 25 U/L (ref 14–54)
ANION GAP: 8 (ref 5–15)
AST: 20 U/L (ref 15–41)
Alkaline Phosphatase: 50 U/L (ref 38–126)
BUN: 20 mg/dL (ref 6–20)
CHLORIDE: 102 mmol/L (ref 101–111)
CO2: 27 mmol/L (ref 22–32)
Calcium: 9.2 mg/dL (ref 8.9–10.3)
Creatinine, Ser: 0.64 mg/dL (ref 0.44–1.00)
GFR calc Af Amer: >60 mL/min (ref >60)
Glucose, Bld: 94 mg/dL (ref 65–99)
POTASSIUM: 4.2 mmol/L (ref 3.5–5.1)
Sodium: 137 mmol/L (ref 135–145)
Total Bilirubin: 0.7 mg/dL (ref 0.3–1.2)
Total Protein: 7.3 g/dL (ref 6.5–8.1)

## 2016-08-10 NOTE — Progress Notes (Signed)
[facility] OFFICE PROGRESS NOTE  Patient Care Team: Ria Bush, MD as PCP - General (Family Medicine) Robert Bellow, MD (General Surgery) Leia Alf, MD (Inactive) as Attending Physician (Internal Medicine)  No matching staging information was found for the patient.   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   Primary tumor size 1.3 cm, grade 3.  Margins negative.  One sentinel lymph node negative for malignancy.   ER and PR negative.  HER-2/neu negative (1+ on IHC).  Patient received adjuvant chemotherapy (on clinical trial ECOG 5103, AC followed by paclitaxel plus Avastin/placebo).  Last dose of Paclitaxel was 11/15/08.  Last dose of Avastin/placebo was 06/10/09.  2. 06/25/13 - Biopsy of developing left breast calcification seen on mammogram reported negative for atypia/malignancy.  # BRCA- NEG      Carcinoma of upper-outer quadrant of left breast in female, estrogen receptor negative ([place])   05/27/2008 Initial Diagnosis    Carcinoma of upper-outer quadrant of left breast in female, estrogen receptor negative ([place])       This is my first interaction with the patient as patient's primary oncologist has been Dr. Ma Hillock. I reviewed the patient's prior charts/pertinent labs/imaging in detail; findings are summarized above.     INTERVAL HISTORY:  Susan Soto 50 y.o.  female pleasant patient above history of Stage I triple negative breast cancer is here for follow-up. Patient was in a clinical trial with adjuvant Avastin/placebo last received in August 2010.  Patient denies any lumps or bumps. Appetite is good. No chest pain or shortness of breath or cough. No bone pain. No headaches.  REVIEW OF SYSTEMS:  A complete 10 point review of system is done which is negative except mentioned above/history of present illness.    PAST MEDICAL HISTORY :  Past Medical History:  Diagnosis Date  . ADD (attention deficit disorder) 1990s  . Anxiety   . Breast cancer ([place]) 2009   left, chem and radiation  . Heart murmur   . Hemorrhoids   . History of breast cancer 2009   Left side, lumpectomy, s/p sentinal LN biopsy, chemo and radiation, remission  . Malignant neoplasm of upper-outer quadrant of female breast ([place]) 05/27/2008   Left breast, 1.3 cm triple negative, node negative invasive mammary carcinoma. T1c, N0  . Subclinical hypothyroidism   . Syncope and collapse     PAST SURGICAL HISTORY :   Past Surgical History:  Procedure Laterality Date  . bartholin's cystectomy    . BREAST BIOPSY Left 05/07/08   Left (infitr ductal CA)- Dr. Bary Castilla  . BREAST BIOPSY Left 06/25/13   left (benign) atrophic ductal epithelium, microcalcifications, changes of previous surgery.  . breast wide excision  05/27/08   Left-with sentinel node bx (Dr. Bary Castilla)  . COLONOSCOPY  07/2009   WNL ([place])  . CYSTOSCOPY    . episiotomy      FAMILY HISTORY :   Family History  Problem Relation Age of Onset  . Drug abuse Father     Heroin  . Hypertension Mother   . Fibromyalgia Mother   . ADD / ADHD Mother   . Depression Mother   . Cancer Mother     cervical  . Colon polyps Mother     Hyperplastic  . ADD / ADHD Brother   . Anxiety disorder Brother   . Stroke  Maternal Grandmother   . Breast cancer Maternal Grandmother     50's  . Hypertension Paternal Grandmother   . Cancer Paternal Grandfather     lung  . Hypothyroidism Maternal Aunt   . Hypothyroidism Maternal Uncle   . Diabetes Neg Hx   . Coronary artery disease Neg Hx     SOCIAL HISTORY:   Social History  Substance Use Topics  . Smoking status: Never Smoker  . Smokeless tobacco: Never Used  . Alcohol use No    ALLERGIES:  is allergic to bupropion hcl; buspar [buspirone hcl]; other; strattera [atomoxetine hcl]; and latex.  MEDICATIONS:  Current Outpatient  Prescriptions  Medication Sig Dispense Refill  . ARIPiprazole (ABILIFY) 2 MG tablet Take 1 mg by mouth daily.     . Cholecalciferol (VITAMIN D-3) 5000 UNITS TABS Take 2 tablets by mouth daily.     Marland Kitchen dextroamphetamine (DEXEDRINE SPANSULE) 15 MG 24 hr capsule Take 30 mg by mouth daily.  0  . dextroamphetamine (DEXTROSTAT) 10 MG tablet 2 in the morning and 1 at lunch  0  . diazepam (VALIUM) 2 MG tablet Take 2 mg by mouth every 12 (twelve) hours as needed for anxiety.     . Probiotic Product (PROBIOTIC DAILY PO) Take by mouth daily.    . Pyridoxal-5-Phosphate POWD by Does not apply route daily.    Marland Kitchen pyridoxine (B-6) 100 MG tablet Take 100 mg by mouth daily.    . valACYclovir (VALTREX) 1000 MG tablet Take 2 tablets (2,000 mg total) by mouth 2 (two) times daily. x1 day 4 tablet 3  . zinc gluconate 50 MG tablet Take 50 mg by mouth daily.    Mariane Baumgarten Calcium (STOOL SOFTENER PO) Take by mouth as needed.    Marland Kitchen SYNTHROID 25 MCG tablet Take 25 mcg by mouth every morning.  4   No current facility-administered medications for this visit.    Facility-Administered Medications Ordered in Other Visits  Medication Dose Route Frequency Provider Last Rate Last Dose  . Influenza vac split quadrivalent PF (FLUARIX) injection 0.5 mL  0.5 mL Intramuscular Once Leia Alf, MD        PHYSICAL EXAMINATION: ECOG PERFORMANCE STATUS: 0 - Asymptomatic  BP 107/73 (BP Location: Right Arm, Patient Position: Sitting)   Pulse 76   Temp 97.1 F (36.2 C) (Tympanic)   Resp 18   Wt 151 lb 6 oz (68.7 kg)   LMP 03/04/2014   BMI 24.81 kg/m   Filed Weights   08/10/16 1018  Weight: 151 lb 6 oz (68.7 kg)    GENERAL: Well-nourished well-developed; Alert, no distress and comfortable.   alone EYES: no pallor or icterus OROPHARYNX: no thrush or ulceration; good dentition  NECK: supple, no masses felt LYMPH:  no palpable lymphadenopathy in the cervical, axillary or inguinal regions LUNGS: clear to auscultation and  No  wheeze or crackles HEART/CVS: regular rate & rhythm and no murmurs; No lower extremity edema ABDOMEN:abdomen soft, non-tender and normal bowel sounds Musculoskeletal:no cyanosis of digits and no clubbing  PSYCH: alert & oriented x 3 with fluent speech NEURO: no focal motor/sensory deficits SKIN:  no rashes or significant lesions Right and left BREAST exam [in the presence of nurse]- no unusual skin changes or dominant masses felt. Surgical scars noted.   LABORATORY DATA:  I have reviewed the data as listed    Component Value Date/Time   NA 137 08/10/2016 0925   NA 142 06/27/2016 1442   K 4.2 08/10/2016 0925  CL 102 08/10/2016 0925   CO2 27 08/10/2016 0925   GLUCOSE 94 08/10/2016 0925   BUN 20 08/10/2016 0925   BUN 12 06/27/2016 1442   CREATININE 0.64 08/10/2016 0925   CREATININE 0.61 07/22/2015   CALCIUM 9.2 08/10/2016 0925   PROT 7.3 08/10/2016 0925   PROT 6.9 07/22/2015   PROT 7.0 07/23/2014 1024   ALBUMIN 4.8 08/10/2016 0925   ALBUMIN 4.1 07/23/2014 1024   AST 20 08/10/2016 0925   AST 13 07/22/2015   ALT 25 08/10/2016 0925   ALT 15 07/22/2015   ALKPHOS 50 08/10/2016 0925   ALKPHOS 45 07/22/2015   BILITOT 0.7 08/10/2016 0925   BILITOT 0.4 07/22/2015   GFRNONAA >60 08/10/2016 0925   GFRNONAA >60 07/23/2014 1024   GFRNONAA >60 07/22/2013 0949   GFRAA >60 08/10/2016 0925   GFRAA >60 07/23/2014 1024   GFRAA >60 07/22/2013 0949    No results found for: SPEP, UPEP  Lab Results  Component Value Date   WBC 5.9 08/10/2016   NEUTROABS 2.7 08/10/2016   HGB 13.8 08/10/2016   HCT 40.4 08/10/2016   MCV 89.3 08/10/2016   PLT 247 08/10/2016      Chemistry      Component Value Date/Time   NA 137 08/10/2016 0925   NA 142 06/27/2016 1442   K 4.2 08/10/2016 0925   CL 102 08/10/2016 0925   CO2 27 08/10/2016 0925   BUN 20 08/10/2016 0925   BUN 12 06/27/2016 1442   CREATININE 0.64 08/10/2016 0925   CREATININE 0.61 07/22/2015      Component Value Date/Time   CALCIUM  9.2 08/10/2016 0925   ALKPHOS 50 08/10/2016 0925   ALKPHOS 45 07/22/2015   AST 20 08/10/2016 0925   AST 13 07/22/2015   ALT 25 08/10/2016 0925   ALT 15 07/22/2015   BILITOT 0.7 08/10/2016 0925   BILITOT 0.4 07/22/2015       RADIOGRAPHIC STUDIES: I have personally reviewed the radiological images as listed and agreed with the findings in the report. No results found.   ASSESSMENT & PLAN:  Carcinoma of upper-outer quadrant of left breast in female, estrogen receptor negative ([place]) # Triple negative breast cancer- left Stage I ; lumpectomy status post adjuvant chemotherapy in 2009.  # Clinically no evidence of recurrence. Mammogram within normal limits. Discussed the natural history of triple negative breast cancer-patient likely cured of her cancer.   # will review with clinical trials RN re: follow up.   # follow up with MD/Labs/ in 12 months.    No orders of the defined types were placed in this encounter.  All questions were answered. The patient knows to call the clinic with any problems, questions or concerns.      Cammie Sickle, MD 08/10/2016 1:27 PM

## 2016-08-10 NOTE — Progress Notes (Signed)
Patient here today for annual follow up regarding breast cancer.

## 2016-08-10 NOTE — Assessment & Plan Note (Addendum)
#   Triple negative breast cancer- left Stage I ; lumpectomy status post adjuvant chemotherapy in 2009.  # Clinically no evidence of recurrence. Mammogram within normal limits. Discussed the natural history of triple negative breast cancer-patient likely cured of her cancer.   # will review with clinical trials RN re: follow up.   # follow up with MD/Labs/ in 12 months.

## 2016-08-11 LAB — CANCER ANTIGEN 27.29: CA 27.29: 25 U/mL (ref 0.0–38.6)

## 2017-04-04 ENCOUNTER — Other Ambulatory Visit: Payer: Self-pay

## 2017-04-04 DIAGNOSIS — Z1231 Encounter for screening mammogram for malignant neoplasm of breast: Secondary | ICD-10-CM

## 2017-04-22 ENCOUNTER — Telehealth: Payer: Self-pay | Admitting: Family Medicine

## 2017-04-22 NOTE — Telephone Encounter (Signed)
Pt gets labs done at [facility] (employee)Can you please mail sheet to pt home address for cpe labs? Pt is scheduled for 09/18  Thanks

## 2017-04-23 ENCOUNTER — Ambulatory Visit (INDEPENDENT_AMBULATORY_CARE_PROVIDER_SITE_OTHER): Payer: Managed Care, Other (non HMO) | Admitting: Family Medicine

## 2017-04-23 ENCOUNTER — Encounter: Payer: Self-pay | Admitting: Family Medicine

## 2017-04-23 VITALS — BP 124/82 | HR 78 | Temp 98.1°F | Wt 159.8 lb

## 2017-04-23 DIAGNOSIS — Z853 Personal history of malignant neoplasm of breast: Secondary | ICD-10-CM

## 2017-04-23 DIAGNOSIS — N950 Postmenopausal bleeding: Secondary | ICD-10-CM | POA: Diagnosis not present

## 2017-04-23 DIAGNOSIS — E039 Hypothyroidism, unspecified: Secondary | ICD-10-CM | POA: Diagnosis not present

## 2017-04-23 DIAGNOSIS — R5382 Chronic fatigue, unspecified: Secondary | ICD-10-CM | POA: Diagnosis not present

## 2017-04-23 DIAGNOSIS — R3915 Urgency of urination: Secondary | ICD-10-CM | POA: Diagnosis not present

## 2017-04-23 DIAGNOSIS — N898 Other specified noninflammatory disorders of vagina: Secondary | ICD-10-CM

## 2017-04-23 DIAGNOSIS — E038 Other specified hypothyroidism: Secondary | ICD-10-CM

## 2017-04-23 LAB — POC URINALSYSI DIPSTICK (AUTOMATED)
Bilirubin, UA: NEGATIVE
Blood, UA: NEGATIVE
Glucose, UA: NEGATIVE
KETONES UA: NEGATIVE
LEUKOCYTES UA: NEGATIVE
NITRITE UA: NEGATIVE
PROTEIN UA: NEGATIVE
Spec Grav, UA: 1.015 (ref 1.010–1.025)
Urobilinogen, UA: 0.2 E.U./dL
pH, UA: 6 (ref 5.0–8.0)

## 2017-04-23 NOTE — Telephone Encounter (Signed)
Rx written and given to patient today.

## 2017-04-23 NOTE — Assessment & Plan Note (Addendum)
Isolated episode of post menopausal bleed. No signs of vaginal irritation or further bleed. UTD pap smear. Will check CBC, wet prep, pelvic/transvasginal ultrasound to further eval bleed episode. Discussed possible referral to GYN for endometrial biopsy. Pt agrees.

## 2017-04-23 NOTE — Patient Instructions (Addendum)
Urine returned normal. Labs today. We have sent off wet prep today. See Rosaria Ferries on your way out to schedule pelvic exam.  We may refer you gynecologist pending results.

## 2017-04-23 NOTE — Progress Notes (Signed)
BP 124/82   Pulse 78   Temp 98.1 F (36.7 C) (Oral)   Wt 159 lb 12 oz (72.5 kg)   LMP 03/04/2014   SpO2 98%   BMI 26.18 kg/m    CC: postmenopausal bleed Subjective:    Patient ID: Susan Soto, female    DOB: [birth year], 51 y.o.   MRN: [id number]  HPI: Susan Soto is a 51 y.o. female presenting on 04/23/2017 for Urinary Frequency (and urgency...) and Vaginal Bleeding (pt reports last menses was years ago...started 04/15/2017 lasting 1 week...not heavy, spotting dark brown discharge and odor...vaginal itching prior)   LMP 02/2014. Started bleeding last week, several days with initial dark brown bleeding, progressed to spotting and today stopped.  Mild dizziness, increased fatigue.   Some vaginal discharge and itching this past week which has since resolved.  Noticing increasing urinary urgency, incomplete emptying over last few weeks. No dysuria.   fmhx vulvar, cervical, bladder cancers as well as lung and breast.   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 Had labs done by psych recently - TFTs, zinc, copper, vit D checked and normal.   Pap smear 06/2016 - negative for intraepithelial lesion/malignancy Not currently sexually active  No new lotion or vaginal cream or lubricant use  Relevant past medical, surgical, family and social history reviewed and updated as indicated. Interim medical history since our last visit reviewed. Allergies and medications reviewed and updated. Outpatient Medications Prior to Visit  Medication Sig Dispense Refill  . ARIPiprazole (ABILIFY) 2 MG tablet Take 1 mg by mouth daily.     . Cholecalciferol (VITAMIN D-3) 5000 UNITS TABS Take 2 tablets by mouth daily.     Marland Kitchen dextroamphetamine (DEXEDRINE SPANSULE) 15 MG 24 hr capsule Take 30 mg by mouth daily.  0  . dextroamphetamine (DEXTROSTAT) 10 MG tablet 2 in the morning and 1 at lunch  0  . diazepam (VALIUM) 2 MG tablet Take 2 mg by mouth every 12 (twelve) hours as  needed for anxiety.     Mariane Baumgarten Calcium (STOOL SOFTENER PO) Take by mouth as needed.    . Probiotic Product (PROBIOTIC DAILY PO) Take by mouth daily.    . Pyridoxal-5-Phosphate POWD by Does not apply route daily.    Marland Kitchen pyridoxine (B-6) 100 MG tablet Take 100 mg by mouth daily.    Marland Kitchen SYNTHROID 25 MCG tablet Take 37.5 mcg by mouth every morning.   4  . valACYclovir (VALTREX) 1000 MG tablet Take 2 tablets (2,000 mg total) by mouth 2 (two) times daily. x1 day 4 tablet 3  . zinc gluconate 50 MG tablet Take 50 mg by mouth daily.     Facility-Administered Medications Prior to Visit  Medication Dose Route Frequency Provider Last Rate Last Dose  . Influenza vac split quadrivalent PF (FLUARIX) injection 0.5 mL  0.5 mL Intramuscular Once Leia Alf, MD         Per HPI unless specifically indicated in ROS section below Review of Systems     Objective:    BP 124/82   Pulse 78   Temp 98.1 F (36.7 C) (Oral)   Wt 159 lb 12 oz (72.5 kg)   LMP 03/04/2014   SpO2 98%   BMI 26.18 kg/m   Wt Readings from Last 3 Encounters:  04/23/17 159 lb 12 oz (72.5 kg)  08/10/16 151 lb 6 oz (68.7 kg)  07/06/16 148 lb (67.1 kg)    Physical Exam  Constitutional: She appears  well-developed and well-nourished. No distress.  HENT:  Mouth/Throat: Oropharynx is clear and moist. No oropharyngeal exudate.  Eyes: Conjunctivae and EOM are normal. Pupils are equal, round, and reactive to light.  Neck: Normal range of motion. Neck supple.  Cardiovascular: Normal rate, regular rhythm, normal heart sounds and intact distal pulses.   No murmur heard. Pulmonary/Chest: Effort normal and breath sounds normal. No respiratory distress. She has no wheezes. She has no rales.  Abdominal: Soft. Bowel sounds are normal. She exhibits no distension and no mass. There is no tenderness. There is no rebound and no guarding.  Genitourinary: Uterus normal. Pelvic exam was performed with patient supine. There is no rash, tenderness or  lesion on the right labia. There is no rash, tenderness or lesion on the left labia. Cervix exhibits no motion tenderness, no discharge and no friability. Right adnexum displays no mass and no tenderness. Left adnexum displays no mass and no tenderness. No erythema in the vagina. Vaginal discharge (small amt white) found.  Musculoskeletal: She exhibits no edema.  Skin: Skin is warm and dry. No rash noted.  Psychiatric: She has a normal mood and affect.  Nursing note and vitals reviewed.  Results for orders placed or performed in visit on 04/23/17  CBC with Differential/Platelet  Result Value Ref Range   WBC 6.8 3.4 - 10.8 x10E3/uL   RBC 4.36 3.77 - 5.28 x10E6/uL   Hemoglobin 13.0 11.1 - 15.9 g/dL   Hematocrit 39.1 34.0 - 46.6 %   MCV 90 79 - 97 fL   MCH 29.8 26.6 - 33.0 pg   MCHC 33.2 31.5 - 35.7 g/dL   RDW 14.2 12.3 - 15.4 %   Platelets 296 150 - 379 x10E3/uL   Neutrophils 50 Not Estab. %   Lymphs 40 Not Estab. %   Monocytes 9 Not Estab. %   Eos 1 Not Estab. %   Basos 0 Not Estab. %   Neutrophils Absolute 3.4 1.4 - 7.0 x10E3/uL   Lymphocytes Absolute 2.8 0.7 - 3.1 x10E3/uL   Monocytes Absolute 0.6 0.1 - 0.9 x10E3/uL   EOS (ABSOLUTE) 0.1 0.0 - 0.4 x10E3/uL   Basophils Absolute 0.0 0.0 - 0.2 x10E3/uL   Immature Granulocytes 0 Not Estab. %   Immature Grans (Abs) 0.0 0.0 - 0.1 H60V3/XT  Basic metabolic panel  Result Value Ref Range   Glucose 84 65 - 99 mg/dL   BUN 12 6 - 24 mg/dL   Creatinine, Ser 0.68 0.57 - 1.00 mg/dL   GFR calc non Af Amer 102 >59 mL/min/1.73   GFR calc Af Amer 117 >59 mL/min/1.73   BUN/Creatinine Ratio 18 9 - 23   Sodium 143 134 - 144 mmol/L   Potassium 4.9 3.5 - 5.2 mmol/L   Chloride 102 96 - 106 mmol/L   CO2 27 20 - 29 mmol/L   Calcium 9.9 8.7 - 10.2 mg/dL  Vitamin B12  Result Value Ref Range   Vitamin B-12 447 232 - 1,245 pg/mL  Ferritin  Result Value Ref Range   Ferritin 184 (H) 15 - 150 ng/mL  CA 125  Result Value Ref Range   Cancer Antigen  (CA) 125 13.3 0.0 - 38.1 U/mL  POCT Urinalysis Dipstick (Automated)  Result Value Ref Range   Color, UA     Clarity, UA     Glucose, UA neg    Bilirubin, UA neg    Ketones, UA neg    Spec Grav, UA 1.015 1.010 - 1.025   Blood, UA  neg    pH, UA 6.0 5.0 - 8.0   Protein, UA neg    Urobilinogen, UA 0.2 0.2 or 1.0 E.U./dL   Nitrite, UA neg    Leukocytes, UA Negative Negative      Assessment & Plan:  UA for vaginal urgency - WNL. Problem List Items Addressed This Visit    Chronic fatigue    Update b12, CBC, BMP      Relevant Orders   CBC with Differential/Platelet (Completed)   Basic metabolic panel (Completed)   Vitamin B12 (Completed)   Ferritin (Completed)   Personal history of breast cancer    Requests CA125 checked.       Relevant Orders   CA 125 (Completed)   Postmenopausal bleeding - Primary    Isolated episode of post menopausal bleed. No signs of vaginal irritation or further bleed. UTD pap smear. Will check CBC, wet prep, pelvic/transvasginal ultrasound to further eval bleed episode. Discussed possible referral to GYN for endometrial biopsy. Pt agrees.       Relevant Orders   US Pelvis Complete   US Transvaginal Non-OB   CBC with Differential/Platelet (Completed)   Subclinical hypothyroidism    Followed and managed by psych       Other Visit Diagnoses    Vaginal discharge       Relevant Orders   WET PREP BY MOLECULAR PROBE   Urinary urgency       Relevant Orders   POCT Urinalysis Dipstick (Automated) (Completed)       Follow up plan: Return if symptoms worsen or fail to improve.  Ria Bush, MD

## 2017-04-24 LAB — CBC WITH DIFFERENTIAL/PLATELET
BASOS: 0 %
Basophils Absolute: 0 10*3/uL (ref 0.0–0.2)
EOS (ABSOLUTE): 0.1 10*3/uL (ref 0.0–0.4)
Eos: 1 %
Hematocrit: 39.1 % (ref 34.0–46.6)
Hemoglobin: 13 g/dL (ref 11.1–15.9)
IMMATURE GRANS (ABS): 0 10*3/uL (ref 0.0–0.1)
Immature Granulocytes: 0 %
LYMPHS: 40 %
Lymphocytes Absolute: 2.8 10*3/uL (ref 0.7–3.1)
MCH: 29.8 pg (ref 26.6–33.0)
MCHC: 33.2 g/dL (ref 31.5–35.7)
MCV: 90 fL (ref 79–97)
MONOCYTES: 9 %
Monocytes Absolute: 0.6 10*3/uL (ref 0.1–0.9)
NEUTROS ABS: 3.4 10*3/uL (ref 1.4–7.0)
Neutrophils: 50 %
Platelets: 296 10*3/uL (ref 150–379)
RBC: 4.36 x10E6/uL (ref 3.77–5.28)
RDW: 14.2 % (ref 12.3–15.4)
WBC: 6.8 10*3/uL (ref 3.4–10.8)

## 2017-04-24 LAB — BASIC METABOLIC PANEL
BUN / CREAT RATIO: 18 (ref 9–23)
BUN: 12 mg/dL (ref 6–24)
CO2: 27 mmol/L (ref 20–29)
Calcium: 9.9 mg/dL (ref 8.7–10.2)
Chloride: 102 mmol/L (ref 96–106)
Creatinine, Ser: 0.68 mg/dL (ref 0.57–1.00)
GFR calc non Af Amer: 102 mL/min/{1.73_m2} (ref >59)
GFR, EST AFRICAN AMERICAN: 117 mL/min/{1.73_m2} (ref >59)
Glucose: 84 mg/dL (ref 65–99)
Potassium: 4.9 mmol/L (ref 3.5–5.2)
Sodium: 143 mmol/L (ref 134–144)

## 2017-04-24 LAB — VITAMIN B12: VITAMIN B 12: 447 pg/mL (ref 232–1245)

## 2017-04-24 LAB — CA 125: Cancer Antigen (CA) 125: 13.3 U/mL (ref 0.0–38.1)

## 2017-04-24 LAB — FERRITIN: Ferritin: 184 ng/mL — ABNORMAL HIGH (ref 15–150)

## 2017-04-24 NOTE — Assessment & Plan Note (Signed)
Requests CA125 checked.

## 2017-04-24 NOTE — Assessment & Plan Note (Addendum)
Followed and managed by psych

## 2017-04-24 NOTE — Assessment & Plan Note (Signed)
Update b12, CBC, BMP

## 2017-04-25 LAB — WET PREP BY MOLECULAR PROBE
Candida species: NOT DETECTED
GARDNERELLA VAGINALIS: NOT DETECTED
Trichomonas vaginosis: NOT DETECTED

## 2017-05-06 ENCOUNTER — Ambulatory Visit
Admission: RE | Admit: 2017-05-06 | Discharge: 2017-05-06 | Disposition: A | Discharge status: Home / Self Care | Payer: Managed Care, Other (non HMO) | Source: Ambulatory Visit | Attending: Family Medicine | Admitting: Family Medicine

## 2017-05-06 DIAGNOSIS — N950 Postmenopausal bleeding: Secondary | ICD-10-CM | POA: Diagnosis present

## 2017-05-07 ENCOUNTER — Encounter: Payer: Self-pay | Admitting: Family Medicine

## 2017-05-07 DIAGNOSIS — N950 Postmenopausal bleeding: Secondary | ICD-10-CM

## 2017-05-20 ENCOUNTER — Encounter: Payer: Self-pay | Admitting: Obstetrics and Gynecology

## 2017-05-20 ENCOUNTER — Ambulatory Visit (INDEPENDENT_AMBULATORY_CARE_PROVIDER_SITE_OTHER): Payer: Managed Care, Other (non HMO) | Admitting: Obstetrics and Gynecology

## 2017-05-20 VITALS — BP 110/70 | Ht 65.5 in | Wt 159.0 lb

## 2017-05-20 DIAGNOSIS — Z803 Family history of malignant neoplasm of breast: Secondary | ICD-10-CM

## 2017-05-20 DIAGNOSIS — C50412 Malignant neoplasm of upper-outer quadrant of left female breast: Secondary | ICD-10-CM | POA: Diagnosis not present

## 2017-05-20 DIAGNOSIS — N950 Postmenopausal bleeding: Secondary | ICD-10-CM

## 2017-05-20 DIAGNOSIS — Z171 Estrogen receptor negative status [ER-]: Secondary | ICD-10-CM

## 2017-05-20 DIAGNOSIS — Z1371 Encounter for nonprocreative screening for genetic disease carrier status: Secondary | ICD-10-CM

## 2017-05-20 HISTORY — DX: Encounter for nonprocreative screening for genetic disease carrier status: Z13.71

## 2017-05-20 NOTE — Progress Notes (Signed)
Chief Complaint  Patient presents with  . Vaginal Bleeding    x7days    HPI:      [name] is a 51 y.o. G3P0010 who LMP was Patient's last menstrual period was 03/04/2014., presents today for NP eval of postmenopausal bleeding. Pt is s/p breast cancer with chemo treatment age 30. Her menses stopped with chemo, restarted for a year or so, stopped again, restarted, and have now been gone for the past 3 yrs. Pt noticed 7 days of light, dark blood 04/15/17. Sx resolved and haven't recurred. PCP checked u/s which as normal. EM on u/s=2.6 mm. Pt had vaginal itching for about a month before bleeding started, but sx resolved. She does have vag dryness/dyspareunia and can't have any vaginal estrogen. She was not sex active before bleeding started. She had a neg pap 9/17. She is currently being treated with thyroid meds for ADD by psychiatry (TSH labs WNL but not good enough for ADD per pt report). I do not see any FSH levels on Epic.   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 She had neg BRCA testing about 2010 but is interested in [facility] update testing.    Past Medical History:  Diagnosis Date  . ADD (attention deficit disorder) 1990s  . Anxiety   . Breast cancer ([place]) 2009   left, chem and radiation  . Heart murmur   . Hemorrhoids   . History of breast cancer 2009   Left side, lumpectomy, s/p sentinal LN biopsy, chemo and radiation, remission  . 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 T1c, N0  . Subclinical hypothyroidism   . Syncope and collapse     Past Surgical History:  Procedure Laterality Date  . bartholin's cystectomy    . BREAST BIOPSY Left 05/07/08   Left (infitr ductal CA)- Dr. Bary Castilla  . BREAST BIOPSY Left 06/25/13   left (benign) atrophic ductal epithelium, microcalcifications, changes of previous surgery.  . breast wide excision  05/27/08   Left-with sentinel node bx (Dr. Bary Castilla)  . COLONOSCOPY  07/2009   WNL ([place])  . CYSTOSCOPY    . episiotomy      Family History  Problem Relation Age of Onset  . Drug abuse Father        Heroin  . Hypertension Mother   . Fibromyalgia Mother   . ADD / ADHD Mother   . Depression Mother   . Cancer Mother        cervical  . Colon polyps Mother        Hyperplastic  . ADD / ADHD Brother   . Anxiety disorder Brother   . Stroke Maternal Grandmother   . Breast cancer Maternal Grandmother        50's  . Hypertension Paternal Grandmother   . Cancer Paternal Grandfather        lung  . Hypothyroidism Maternal Aunt   . Cancer Maternal Aunt        vulvar  . Hypothyroidism Maternal Uncle   . Diabetes Neg Hx   . Coronary artery disease Neg Hx     Social History   Social History  . Marital status: Married    Spouse name: N/A  . Number of children: N/A  . Years of education: N/A   Occupational History  . Social Worker    Social History Main Topics  . Smoking status:  Never Smoker  . Smokeless tobacco: Never Used  . Alcohol use No  . Drug use: No  . Sexual activity: Not on file   Other Topics Concern  . Not on file   Social History Narrative   Caffeine: 1 cup coffee in am   Lives with husband and 1 son, daughter in college, 1 dog and 1 cat, 5-6 outside cats   Occupation: Camera operator job   Activity: no regular activity   Diet: joined [employer], good water, fruits/vegetables daily, red meat 3x/wk, fish 2x/wk      Psych- Dr. Wallis Mart in [place] (ph [phone])     Current Outpatient Prescriptions:  .  ARIPiprazole (ABILIFY) 2 MG tablet, Take 1 mg by mouth daily. , Disp: , Rfl:  .  Cholecalciferol (VITAMIN D-3) 5000 UNITS TABS, Take 2 tablets by mouth daily. , Disp: , Rfl:  .  dextroamphetamine (DEXEDRINE SPANSULE) 15 MG 24 hr capsule, Take 30 mg by mouth daily., Disp: , Rfl: 0 .  dextroamphetamine (DEXTROSTAT) 10 MG tablet, 2 in the morning and 1 at lunch,  Disp: , Rfl: 0 .  diazepam (VALIUM) 2 MG tablet, Take 2 mg by mouth every 12 (twelve) hours as needed for anxiety. , Disp: , Rfl:  .  Docusate Calcium (STOOL SOFTENER PO), Take by mouth as needed., Disp: , Rfl:  .  Probiotic Product (PROBIOTIC DAILY PO), Take by mouth daily., Disp: , Rfl:  .  Pyridoxal-5-Phosphate POWD, by Does not apply route daily., Disp: , Rfl:  .  pyridoxine (B-6) 100 MG tablet, Take 100 mg by mouth daily., Disp: , Rfl:  .  SYNTHROID 25 MCG tablet, Take 37.5 mcg by mouth every morning. , Disp: , Rfl: 4 .  valACYclovir (VALTREX) 1000 MG tablet, Take 2 tablets (2,000 mg total) by mouth 2 (two) times daily. x1 day, Disp: 4 tablet, Rfl: 3 .  zinc gluconate 50 MG tablet, Take 50 mg by mouth daily., Disp: , Rfl:  No current facility-administered medications for this visit.   Facility-Administered Medications Ordered in Other Visits:  .  Influenza vac split quadrivalent PF (FLUARIX) injection 0.5 mL, 0.5 mL, Intramuscular, Once, Leia Alf, MD   ROS:  Review of Systems  Constitutional: Negative for fever.  Gastrointestinal: Positive for constipation. Negative for blood in stool, diarrhea, nausea and vomiting.  Genitourinary: Positive for menstrual problem, vaginal bleeding and vaginal discharge. Negative for dyspareunia, dysuria, flank pain, frequency, hematuria, urgency and vaginal pain.  Musculoskeletal: Negative for back pain.  Skin: Negative for rash.     OBJECTIVE:   Vitals:  BP 110/70   Ht 5' 5.5" (1.664 m)   Wt 159 lb (72.1 kg)   LMP 03/04/2014   BMI 26.06 kg/m   Physical Exam  Constitutional: She is oriented to person, place, and time and well-developed, well-nourished, and in no distress. Vital signs are normal.  Genitourinary: Vagina normal, uterus normal, cervix normal, right adnexa normal, left adnexa normal and vulva normal. Uterus is not enlarged. Cervix exhibits no motion tenderness and no tenderness. Right adnexum displays no mass and no  tenderness. Left adnexum displays no mass and no tenderness. Vulva exhibits no erythema, no exudate, no lesion, no rash and no tenderness. Vagina exhibits no lesion.  Neurological: She is oriented to person, place, and time.  Vitals reviewed.    Assessment/Plan: Postmenopausal bleeding - No periods for 3 yrs. Neg GYN u/s with PCP. Check labs. Will f/u with results and dispo.  - Plan: Follicle stimulating hormone,  Estradiol  Family history of breast cancer - [facility] Update testing today. Will call pt with results.   Carcinoma of upper-outer quadrant of left breast in female, estrogen receptor negative ([place])     Return if symptoms worsen or fail to improve.  Yong Grieser B. Aleeza [place], PA-C 05/20/2017 5:16 PM

## 2017-05-21 ENCOUNTER — Telehealth: Payer: Self-pay | Admitting: Obstetrics and Gynecology

## 2017-05-21 LAB — ESTRADIOL: ESTRADIOL: 8.4 pg/mL

## 2017-05-21 LAB — FOLLICLE STIMULATING HORMONE: FSH: 62.6 m[IU]/mL

## 2017-05-21 NOTE — Telephone Encounter (Signed)
Pt aware of labs confirming menopause. Discussed EMB now vs if sx recur, based on u/s EM of 2.6 mm. Pt prefers to proceed with EMB. RTO with MD for procedure.

## 2017-05-22 DIAGNOSIS — Z1379 Encounter for other screening for genetic and chromosomal anomalies: Secondary | ICD-10-CM

## 2017-05-22 HISTORY — DX: Encounter for other screening for genetic and chromosomal anomalies: Z13.79

## 2017-06-04 ENCOUNTER — Encounter: Payer: Self-pay | Admitting: Obstetrics and Gynecology

## 2017-06-04 ENCOUNTER — Ambulatory Visit (INDEPENDENT_AMBULATORY_CARE_PROVIDER_SITE_OTHER): Payer: Managed Care, Other (non HMO) | Admitting: Family Medicine

## 2017-06-04 VITALS — BP 116/74 | HR 70 | Temp 98.0°F | Wt 158.0 lb

## 2017-06-04 DIAGNOSIS — L02415 Cutaneous abscess of right lower limb: Secondary | ICD-10-CM | POA: Diagnosis not present

## 2017-06-04 MED ORDER — SULFAMETHOXAZOLE-TRIMETHOPRIM 800-160 MG PO TABS: 1.0000 | ORAL_TABLET | Freq: Two times a day (BID) | ORAL | 0 refills | Status: DC

## 2017-06-04 NOTE — Assessment & Plan Note (Addendum)
Actually seems like infected/inflamed epidermal cyst. Pt tolerated I&D well. Small incision - no need for packing.  Rx bactrim DS 7d course.  RTC 2 d wound check.

## 2017-06-04 NOTE — Patient Instructions (Addendum)
Take bactrim antibiotic twice daily for 7 days with food.   Incision and Drainage Incision and drainage is a surgical procedure to open and drain a fluid-filled sac. The sac may be filled with pus, mucus, or blood. Examples of fluid-filled sacs that may need surgical drainage include cysts, skin infections (abscesses), and red lumps that develop from a ruptured cyst or a small abscess (boils). You may need this procedure if the affected area is large, painful, infected, or not healing well. Tell a health care provider about:  Any allergies you have.  All medicines you are taking, including vitamins, herbs, eye drops, creams, and over-the-counter medicines.  Any problems you or family members have had with anesthetic medicines.  Any blood disorders you have.  Any surgeries you have had.  Any medical conditions you have.  Whether you are pregnant or may be pregnant. What are the risks? Generally, this is a safe procedure. However, problems may occur, including:  Infection.  Bleeding.  Allergic reactions to medicines.  Scarring.  What happens before the procedure?  You may need an ultrasound or other imaging tests to see how large or deep the fluid-filled sac is.  You may have blood tests to check for infection.  You may get a tetanus shot.  You may be given antibiotic medicine to help prevent infection.  Follow instructions from your health care provider about eating or drinking restrictions.  Ask your health care provider about: ? Changing or stopping your regular medicines. This is especially important if you are taking diabetes medicines or blood thinners. ? Taking medicines such as aspirin and ibuprofen. These medicines can thin your blood. Do not take these medicines before your procedure if your health care provider instructs you not to.  Plan to have someone take you home after the procedure.  If you will be going home right after the procedure, plan to have  someone stay with you for 24 hours. What happens during the procedure?  To reduce your risk of infection: ? Your health care team will wash or sanitize their hands. ? Your skin will be washed with soap.  You will be given one or more of the following: ? A medicine to help you relax (sedative). ? A medicine to numb the area (local anesthetic). ? A medicine to make you fall asleep (general anesthetic).  An incision will be made in the top of the fluid-filled sac.  The contents of the sac may be squeezed out, or a syringe or tube (catheter)may be used to empty the sac.  The catheter may be left in place for several weeks to drain any fluid. Or, your health care provider may stitch open the edges of the incision to make a long-term opening for drainage (marsupialization).  The inside of the sac may be washed out (irrigated) with a sterile solution and packed with gauze before it is covered with a bandage (dressing). The procedure may vary among health care providers and hospitals. What happens after the procedure?  Your blood pressure, heart rate, breathing rate, and blood oxygen level will be monitored often until the medicines you were given have worn off.  Do not drive for 24 hours if you received a sedative. This information is not intended to replace advice given to you by your health care provider. Make sure you discuss any questions you have with your health care provider. Document Released: 04/03/2001 Document Revised: 03/15/2016 Document Reviewed: 07/29/2015 Elsevier Interactive Patient Education  2017 [employer].

## 2017-06-04 NOTE — Progress Notes (Addendum)
BP 116/74   Pulse 70   Temp 98 F (36.7 C)   Wt 158 lb (71.7 kg)   LMP 03/04/2014   BMI 25.89 kg/m    CC: check boil on thigh  Subjective:    Patient ID: Susan Soto, female    DOB: Apr 19, 1966, 51 y.o.   MRN: [id number]  HPI: [name] is a 51 y.o. female presenting on 06/04/2017 for Acute Visit (?? BOIL on upper thigh)   Boil R lateral thigh noticed 1 wk ago, progressively enlarging. Today a bit better. She did use warm compresses yesterday.   Upcoming trip Sunday to [place] (airplane).   Relevant past medical, surgical, family and social history reviewed and updated as indicated. Interim medical history since our last visit reviewed. Allergies and medications reviewed and updated. Outpatient Medications Prior to Visit  Medication Sig Dispense Refill  . ARIPiprazole (ABILIFY) 2 MG tablet Take 1 mg by mouth daily.     . Cholecalciferol (VITAMIN D-3) 5000 UNITS TABS Take 2 tablets by mouth daily.     Marland Kitchen dextroamphetamine (DEXEDRINE SPANSULE) 15 MG 24 hr capsule Take 30 mg by mouth daily.  0  . dextroamphetamine (DEXTROSTAT) 10 MG tablet 2 in the morning and 1 at lunch  0  . diazepam (VALIUM) 2 MG tablet Take 2 mg by mouth every 12 (twelve) hours as needed for anxiety.     Mariane Baumgarten Calcium (STOOL SOFTENER PO) Take by mouth as needed.    . Probiotic Product (PROBIOTIC DAILY PO) Take by mouth daily.    . Pyridoxal-5-Phosphate POWD by Does not apply route daily.    Marland Kitchen pyridoxine (B-6) 100 MG tablet Take 100 mg by mouth daily.    . valACYclovir (VALTREX) 1000 MG tablet Take 2 tablets (2,000 mg total) by mouth 2 (two) times daily. x1 day 4 tablet 3  . zinc gluconate 50 MG tablet Take 50 mg by mouth daily.    Marland Kitchen SYNTHROID 25 MCG tablet Take 25 mcg by mouth every morning. Take  50 mcg by mouth every morning  4   Facility-Administered Medications Prior to Visit  Medication Dose Route Frequency Provider Last Rate Last Dose  . Influenza vac split quadrivalent PF (FLUARIX)  injection 0.5 mL  0.5 mL Intramuscular Once Leia Alf, MD         Per HPI unless specifically indicated in ROS section below Review of Systems     Objective:    BP 116/74   Pulse 70   Temp 98 F (36.7 C)   Wt 158 lb (71.7 kg)   LMP 03/04/2014   BMI 25.89 kg/m   Wt Readings from Last 3 Encounters:  06/04/17 158 lb (71.7 kg)  05/20/17 159 lb (72.1 kg)  04/23/17 159 lb 12 oz (72.5 kg)    Physical Exam  Constitutional: She appears well-developed and well-nourished. No distress.  Skin: Skin is warm and dry. There is erythema.  R lateral thigh with fluctuant abscess that is very tender to palpation  Nursing note and vitals reviewed.  I&D Indication: suspect abscess Pt complaints of: erythema, pain, swelling Location: R lat thigh Size: 2.5cm diameter fluctuant Verbal informed consent obtained.  Pt aware of risks not limited to but including infection, bleeding, damage to near by organs. Prep: betadine Anesthesia: 2%lidocaine with epi, good effect Incision made with #11 blade Would explored Tolerated well Routine postprocedure instructions d/w pt- keep area clean and bandaged, follow up if concerns/spreading erythema/pain.     Assessment &  Plan:   Problem List Items Addressed This Visit    Abscess of right thigh - Primary    Actually seems like infected/inflamed epidermal cyst. Pt tolerated I&D well. Small incision - no need for packing.  Rx bactrim DS 7d course.  RTC 2 d wound check.           Follow up plan: Return in about 2 days (around 06/06/2017), or if symptoms worsen or fail to improve.  Ria Bush, MD

## 2017-06-04 NOTE — Addendum Note (Signed)
Addended by: Ria Bush on: 06/04/2017 04:42 PM   Modules accepted: Orders

## 2017-06-06 ENCOUNTER — Encounter: Payer: Self-pay | Admitting: Family Medicine

## 2017-06-06 ENCOUNTER — Ambulatory Visit (INDEPENDENT_AMBULATORY_CARE_PROVIDER_SITE_OTHER): Payer: Managed Care, Other (non HMO) | Admitting: Family Medicine

## 2017-06-06 DIAGNOSIS — L02415 Cutaneous abscess of right lower limb: Secondary | ICD-10-CM

## 2017-06-06 NOTE — Assessment & Plan Note (Signed)
Inflamed epidermal cyst s/p I&D. Healing well. Home care reviewed. Update if not improving with treatment.

## 2017-06-06 NOTE — Progress Notes (Signed)
BP 116/72   Temp 97.6 F (36.4 C) (Oral)   Wt 161 lb 8 oz (73.3 kg)   LMP 03/04/2014   BMI 26.47 kg/m    CC: wound check Subjective:    Patient ID: Susan Soto, female    DOB: 09/22/1966, 51 y.o.   MRN: [id number]  HPI: Susan Soto is a 51 y.o. female presenting on 06/06/2017 for Follow-up   See prior note for details.  S/p I&D of inflamed epidermal cyst R thigh on 06/04/2017, started on bactrim DS. Tolerating med well.  Planned trip this weekend out of town to [place] as husband received an award.   Relevant past medical, surgical, family and social history reviewed and updated as indicated. Interim medical history since our last visit reviewed. Allergies and medications reviewed and updated. Outpatient Medications Prior to Visit  Medication Sig Dispense Refill  . ARIPiprazole (ABILIFY) 2 MG tablet Take 1 mg by mouth daily.     . Cholecalciferol (VITAMIN D-3) 5000 UNITS TABS Take 2 tablets by mouth daily.     Marland Kitchen dextroamphetamine (DEXEDRINE SPANSULE) 15 MG 24 hr capsule Take 30 mg by mouth daily.  0  . dextroamphetamine (DEXTROSTAT) 10 MG tablet 2 in the morning and 1 at lunch  0  . diazepam (VALIUM) 2 MG tablet Take 2 mg by mouth every 12 (twelve) hours as needed for anxiety.     Mariane Baumgarten Calcium (STOOL SOFTENER PO) Take by mouth as needed.    Marland Kitchen levothyroxine (SYNTHROID, LEVOTHROID) 50 MCG tablet Take 50 mcg by mouth daily before breakfast.    . Probiotic Product (PROBIOTIC DAILY PO) Take by mouth daily.    . Pyridoxal-5-Phosphate POWD by Does not apply route daily.    Marland Kitchen pyridoxine (B-6) 100 MG tablet Take 100 mg by mouth daily.    Marland Kitchen sulfamethoxazole-trimethoprim (BACTRIM DS,SEPTRA DS) 800-160 MG tablet Take 1 tablet by mouth 2 (two) times daily. 14 tablet 0  . valACYclovir (VALTREX) 1000 MG tablet Take 2 tablets (2,000 mg total) by mouth 2 (two) times daily. x1 day 4 tablet 3  . zinc gluconate 50 MG tablet Take 50 mg by mouth daily.     Facility-Administered  Medications Prior to Visit  Medication Dose Route Frequency Provider Last Rate Last Dose  . Influenza vac split quadrivalent PF (FLUARIX) injection 0.5 mL  0.5 mL Intramuscular Once Leia Alf, MD         Per HPI unless specifically indicated in ROS section below Review of Systems     Objective:    BP 116/72   Temp 97.6 F (36.4 C) (Oral)   Wt 161 lb 8 oz (73.3 kg)   LMP 03/04/2014   BMI 26.47 kg/m   Wt Readings from Last 3 Encounters:  06/06/17 161 lb 8 oz (73.3 kg)  06/04/17 158 lb (71.7 kg)  05/20/17 159 lb (72.1 kg)    Physical Exam  Constitutional: She appears well-developed and well-nourished. No distress.  Skin: Skin is warm and dry. No erythema.  Lesion R lateral thigh with minimal draining  Nursing note and vitals reviewed.  Area re-dressed with abx ointment and gauze.    Assessment & Plan:   Problem List Items Addressed This Visit    Abscess of right thigh    Inflamed epidermal cyst s/p I&D. Healing well. Home care reviewed. Update if not improving with treatment.           Follow up plan: No Follow-up on file.  Ria Bush, MD

## 2017-06-10 ENCOUNTER — Telehealth: Payer: Self-pay | Admitting: Obstetrics and Gynecology

## 2017-06-10 NOTE — Telephone Encounter (Signed)
LM for pt that I received MyRisk neg results and know pt spoke with Stefanie Libel, GC. Since pt s/p breast cancer, nothing further to do at this time.

## 2017-06-13 ENCOUNTER — Ambulatory Visit: Payer: Managed Care, Other (non HMO)

## 2017-06-18 ENCOUNTER — Ambulatory Visit (INDEPENDENT_AMBULATORY_CARE_PROVIDER_SITE_OTHER): Payer: Managed Care, Other (non HMO) | Admitting: Obstetrics & Gynecology

## 2017-06-18 ENCOUNTER — Encounter: Payer: Self-pay | Admitting: Obstetrics & Gynecology

## 2017-06-18 VITALS — BP 120/80 | Ht 65.5 in | Wt 160.0 lb

## 2017-06-18 DIAGNOSIS — N950 Postmenopausal bleeding: Secondary | ICD-10-CM

## 2017-06-18 DIAGNOSIS — N9419 Other specified dyspareunia: Secondary | ICD-10-CM

## 2017-06-18 NOTE — Patient Instructions (Signed)
Endometrial Biopsy, Care After This sheet gives you information about how to care for yourself after your procedure. Your health care provider may also give you more specific instructions. If you have problems or questions, contact your health care provider. What can I expect after the procedure? After the procedure, it is common to have:  Mild cramping.  A small amount of vaginal bleeding for a few days. This is normal.  Follow these instructions at home:  Take over-the-counter and prescription medicines only as told by your health care provider.  Do not douche, use tampons, or have sexual intercourse until your health care provider approves.  Return to your normal activities as told by your health care provider. Ask your health care provider what activities are safe for you.  Follow instructions from your health care provider about any activity restrictions, such as restrictions on strenuous exercise or heavy lifting. Contact a health care provider if:  You have heavy bleeding, or bleed for longer than 2 days after the procedure.  You have bad smelling discharge from your vagina.  You have a fever or chills.  You have a burning sensation when urinating or you have difficulty urinating.  You have severe pain in your lower abdomen. Get help right away if:  You have severe cramps in your stomach or back.  You pass large blood clots.  Your bleeding increases.  You become weak or light-headed, or you pass out. Summary  After the procedure, it is common to have mild cramping and a small amount of vaginal bleeding for a few days.  Do not douche, use tampons, or have sexual intercourse until your health care provider approves.  Return to your normal activities as told by your health care provider. Ask your health care provider what activities are safe for you. This information is not intended to replace advice given to you by your health care provider. Make sure you discuss any  questions you have with your health care provider. Document Released: 07/29/2013 Document Revised: 10/24/2016 Document Reviewed: 10/24/2016 Elsevier Interactive Patient Education  2017 [place] oral tablets What is this medicine? OSPEMIFENE (os PEM i feen) is used to treat painful sexual intercourse in females after menopause, a symptom of menopause that occurs due to changes in and around the vagina. This medicine may be used for other purposes; ask your health care provider or pharmacist if you have questions. COMMON BRAND NAME(S): Osphena What should I tell my health care provider before I take this medicine? They need to know if you have any of these conditions: -cancer, such as breast, uterine, or other cancer -heart disease -history of blood clots -history of stroke -history of vaginal bleeding -liver disease -premenopausal -smoke tobacco -an unusual or allergic reaction to ospemifene, other medicines, foods, dyes, or preservatives -pregnant or trying to get pregnant -breast-feeding How should I use this medicine? Take this medicine by mouth with a glass of water. Take this medicine with food. Follow the directions on the prescription label. Do not take your medicine more often than directed. Talk to your pediatrician regarding the use of this medicine in children. Special care may be needed. Overdosage: If you think you have taken too much of this medicine contact a poison control center or emergency room at once. NOTE: This medicine is only for you. Do not share this medicine with others. What if I miss a dose? If you miss a dose, take it as soon as you can. If it is  almost time for your next dose, take only that dose. Do not take double or extra doses. What may interact with this medicine? -doxycycline -estrogens -fluconazole -furosemide -glyburide -ketoconazole -phenytoin -rifampin -warfarin This list may not describe all possible interactions.  Give your health care provider a list of all the medicines, herbs, non-prescription drugs, or dietary supplements you use. Also tell them if you smoke, drink alcohol, or use illegal drugs. Some items may interact with your medicine. What should I watch for while using this medicine? Visit your health care professional for regular checks on your progress. You will need a regular breast and pelvic exam and Pap smear while on this medicine. You should also discuss the need for regular mammograms with your health care professional, and follow his or her guidelines for these tests. Also, periodically discuss the need to continue taking this medicine. Taking this medicine for long periods of time may increase your risk for serious side effects. This medicine can increase the risk of developing a condition (endometrial hyperplasia) that may lead to cancer of the lining of the uterus. Taking progestins, another hormone drug, with this medicine lowers the risk of developing this condition. Therefore, if your uterus has not been removed (by a hysterectomy), your doctor may prescribe a progestin for you to take together with your estrogen. You should know, however, that taking estrogens with progestins may have additional health risks. You should discuss the use of estrogens and progestins with your health care professional to determine the benefits and risks for you. This medicine can rarely cause blood clots. You should avoid long periods of bed rest while taking this medicine. If you are going to have surgery, tell your doctor or health care professional that you are taking this medicine. This medicine should be stopped at least 4-6 weeks before surgery. After surgery, it should be restarted only after you are walking again. It should not be restarted while you still need long periods of bed rest. You should not smoke while taking this medicine. Smoking may also increase your risk of blood clots. Smoking can also  decrease the effects of this medicine. This medicine does not prevent hot flashes. It may cause hot flashes in some patients. If you have any reason to think you are pregnant; stop taking this medicine at once and contact your doctor or health care professional. What side effects may I notice from receiving this medicine? Side effects that you should report to your doctor or health care professional as soon as possible: -breathing problems -changes in vision -confusion, trouble speaking or understanding -new breast lumps -pain, swelling, warmth in the leg -pelvic pain or pressure -severe headaches -sudden chest pain -sudden numbness or weakness of the face, arm or leg -trouble walking, dizziness, loss of balance or coordination -unusual vaginal bleeding patterns -vaginal discharge that is bloody or brown Side effects that usually do not require medical attention (report to your doctor or health care professional if they continue or are bothersome): -hot flushes or flashes -increased sweating -muscle cramps -vaginal discharge (white or clear) This list may not describe all possible side effects. Call your doctor for medical advice about side effects. You may report side effects to FDA at 1-800-FDA-1088. Where should I keep my medicine? Keep out of the reach of children. Store at room temperature between 20 and 25 degrees C (68 and 77 degrees F). Protect from light. Keep container tightly closed. Throw away any unused medicine after the expiration date. NOTE: This sheet is  a summary. It may not cover all possible information. If you have questions about this medicine, talk to your doctor, pharmacist, or health care provider.  2018 Elsevier/Gold Standard (2015-11-10 10:08:00)

## 2017-06-18 NOTE — Progress Notes (Signed)
HPI:      [name] is a 51 y.o. G3P0010 who is postmenopausal, presents today for a problem visit.  She complains of vaginal dryness, dyspareunia and PMB (see prior note, and prior US results).   Symptoms have been present for several months. Symptoms are mod to severe and has led her to come in today to seek options for intervention.  Previous Treatment: none.  Breast cancer so has not taken HRT.  No period now for 3 years.  She is sexually actived.  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 Also,  has a past surgical history that includes bartholin's cystectomy; episiotomy; breast wide excision (05/27/08); Cystoscopy; Breast biopsy (Left, 05/07/08); Breast biopsy (Left, 06/25/13); and Colonoscopy (07/2009)., family history includes ADD / ADHD in her brother and mother; Anxiety disorder in her brother; Breast cancer in her maternal grandmother; Cancer in her maternal aunt, mother, and paternal grandfather; Colon polyps in her mother; Depression in her mother; Drug abuse in her father; Fibromyalgia in her mother; Hypertension in her mother and paternal grandmother; Hypothyroidism in her maternal aunt and maternal uncle; Stroke in her maternal grandmother.,  reports that she has never smoked. She has never used smokeless tobacco. She reports that she does not drink alcohol or use drugs.  She has a current medication list which includes the following prescription(s): aripiprazole, vitamin d-3, dextroamphetamine, dextroamphetamine, diazepam, docusate calcium, levothyroxine, probiotic product, pyridoxal-5-phosphate, pyridoxine, sulfamethoxazole-trimethoprim, valacyclovir, and  zinc gluconate, and the following Facility-Administered Medications: influenza vac split quadrivalent pf. Also, is allergic to bupropion hcl; buspar [buspirone hcl]; other; strattera [atomoxetine hcl]; and latex.  Review of Systems  Constitutional: Negative for chills, fever and malaise/fatigue.  HENT: Negative for congestion, sinus pain and sore throat.   Eyes: Negative for blurred vision and pain.  Respiratory: Negative for cough and wheezing.   Cardiovascular: Negative for chest pain and leg swelling.  Gastrointestinal: Negative for abdominal pain, constipation, diarrhea, heartburn, nausea and vomiting.  Genitourinary: Negative for dysuria, frequency, hematuria and urgency.  Musculoskeletal: Negative for back pain, joint pain, myalgias and neck pain.  Skin: Negative for itching and rash.  Neurological: Negative for dizziness, tremors and weakness.  Endo/Heme/Allergies: Does not bruise/bleed easily.  Psychiatric/Behavioral: Negative for depression. The patient is not nervous/anxious and does not have insomnia.     Objective: BP 120/80   Ht 5' 5.5" (1.664 m)   Wt 160 lb (72.6 kg)   LMP 03/04/2014   BMI 26.22 kg/m  Physical Exam  Constitutional: She is oriented to person, place, and time. She appears well-developed and well-nourished. No distress.  Genitourinary: Vagina normal and uterus normal. Pelvic exam was performed with patient supine. There is no rash, tenderness or lesion on the right labia. There is no rash, tenderness or lesion on the left labia. No erythema or bleeding in the vagina. Right adnexum does not display mass and does not display tenderness. Left adnexum does not display mass and does not display tenderness. Cervix does not exhibit motion tenderness, discharge, polyp or nabothian cyst.   Uterus is mobile and midaxial. Uterus is not enlarged or exhibiting a mass.  Abdominal: Soft. She exhibits no distension. There is no tenderness.  Musculoskeletal: Normal range of  motion.  Neurological: She is  alert and oriented to person, place, and time. No cranial nerve deficit.  Skin: Skin is warm and dry.  Psychiatric: She has a normal mood and affect.  Vitals reviewed.  Endometrial Biopsy After discussion with the patient regarding her abnormal uterine bleeding I recommended that she proceed with an endometrial biopsy for further diagnosis. The risks, benefits, alternatives, and indications for an endometrial biopsy were discussed with the patient in detail. She understood the risks including infection, bleeding, cervical laceration and uterine perforation.  Verbal consent was obtained.   PROCEDURE NOTE:  [place] endometrial biopsy was performed using aseptic technique with iodine preparation.  The uterus was sounded to a length of 7 cm.  Adequate sampling was obtained with minimal blood loss.  The patient tolerated the procedure well.  Disposition will be pending pathology.  ASSESSMENT/PLAN:  Menopause. 1. Postmenopausal bleeding - EMB today. - Review of ULTRASOUND.    I have personally reviewed images and report of recent ultrasound done at [facility].    Plan of management to be discussed with patient. - Pathology  2. Dyspareunia due to medical condition in female - consider options once PMB is deemed not hyperplasia or cancer. - Osphena an option. - Replens discussed and planned for now  Barnett Applebaum, MD, [facility], [facility] 06/18/2017  2:58 PM

## 2017-06-19 ENCOUNTER — Ambulatory Visit: Payer: Managed Care, Other (non HMO) | Admitting: General Surgery

## 2017-06-20 LAB — PATHOLOGY

## 2017-06-20 NOTE — Progress Notes (Signed)
NA

## 2017-06-25 NOTE — Progress Notes (Signed)
LM

## 2017-06-27 ENCOUNTER — Telehealth: Payer: Self-pay | Admitting: Family Medicine

## 2017-06-27 NOTE — Telephone Encounter (Signed)
Caller Name:Stpehanie Mccray Relationship to [place]:  Reason for call:  Pt would like lab form mailed to her home, she is getting labs drawn at [facility] ( employer)  Please mail to: [place] [address] [phone]

## 2017-07-01 NOTE — Telephone Encounter (Signed)
She had most labs done 04/2017 which were ok. Does she need lipid panel for insurance discount purposes? If not, no need to recheck. (I have not ordered this yet) We will check thyroid levels unless she has had this checked recently.  Script written and in CMA box.

## 2017-07-01 NOTE — Telephone Encounter (Signed)
Spoke with pt, relayed message per Dr. Danise Mina. Pt states she does not need lipid panel. But she wants vit d lab included with thyroid labs.

## 2017-07-01 NOTE — Telephone Encounter (Signed)
Mailed lab order to pt. 

## 2017-07-01 NOTE — Telephone Encounter (Signed)
Ok to add vit D - will include in order form.

## 2017-07-03 ENCOUNTER — Ambulatory Visit
Admission: RE | Admit: 2017-07-03 | Discharge: 2017-07-03 | Disposition: A | Discharge status: Home / Self Care | Payer: Managed Care, Other (non HMO) | Source: Ambulatory Visit | Attending: General Surgery | Admitting: General Surgery

## 2017-07-03 ENCOUNTER — Other Ambulatory Visit: Payer: Self-pay | Admitting: General Surgery

## 2017-07-03 DIAGNOSIS — Z1231 Encounter for screening mammogram for malignant neoplasm of breast: Secondary | ICD-10-CM

## 2017-07-03 HISTORY — DX: Personal history of antineoplastic chemotherapy: Z92.21

## 2017-07-03 HISTORY — DX: Personal history of irradiation: Z92.3

## 2017-07-08 ENCOUNTER — Encounter: Payer: Self-pay | Admitting: General Surgery

## 2017-07-08 ENCOUNTER — Encounter: Payer: Self-pay | Admitting: Family Medicine

## 2017-07-08 ENCOUNTER — Ambulatory Visit (INDEPENDENT_AMBULATORY_CARE_PROVIDER_SITE_OTHER): Payer: Managed Care, Other (non HMO) | Admitting: General Surgery

## 2017-07-08 VITALS — BP 126/72 | HR 70 | Resp 12 | Ht 67.0 in | Wt 159.0 lb

## 2017-07-08 DIAGNOSIS — Z853 Personal history of malignant neoplasm of breast: Secondary | ICD-10-CM

## 2017-07-08 LAB — VITAMIN D 25 HYDROXY (VIT D DEFICIENCY, FRACTURES): VIT D 25 HYDROXY: 54.9

## 2017-07-08 LAB — TSH: TSH: 2.39 (ref 0.41–5.90)

## 2017-07-08 NOTE — Patient Instructions (Signed)
Patient will be asked to return to the office in one year with a bilateral screening mammogram. The patient is aware to call back for any questions or concerns. 

## 2017-07-08 NOTE — Progress Notes (Signed)
Patient ID: Susan Soto, female   DOB: 08/17/1966, 51 y.o.   MRN: [id number]  Chief Complaint  Patient presents with  . Follow-up    HPI Susan Soto is a 51 y.o. female who presents for a breast evaluation. The most recent mammogram was done on 07/02/2017.  Patient does perform regular self breast checks and gets regular mammograms done. Patient had some bleeding and was seen by Dr. Kenton Kingfisher.     HPI  Past Medical History:  Diagnosis Date  . ADD (attention deficit disorder) 1990s  . Anxiety   . BRCA negative 05/2017   MyRisk neg  . 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   . Genetic testing of female 05/2017  . Heart murmur   . Hemorrhoids   . History of breast cancer 2009   Left side, lumpectomy, s/p sentinal LN biopsy, chemo and radiation, remission  . 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 T1c, N0  . Personal history of chemotherapy 2009   left breast ca  . Personal history of radiation therapy 2009   left breast ca  . Subclinical hypothyroidism   . Syncope and collapse     Past Surgical History:  Procedure Laterality Date  . bartholin's cystectomy    . BREAST BIOPSY Left 05/07/08   Left (infitr ductal CA)- Dr. Bary Castilla  . BREAST BIOPSY Left 06/25/13   left (benign) atrophic ductal epithelium, microcalcifications, changes of previous surgery.  Marland Kitchen BREAST LUMPECTOMY Left 05/27/2008   invasive ductal carcinoma  . breast wide excision  05/27/08   Left-with sentinel node bx (Dr. Bary Castilla)  . COLONOSCOPY  07/2009   WNL ([place])  . CYSTOSCOPY    . episiotomy      Family History  Problem Relation Age of Onset  . Drug abuse Father        Heroin  . Hypertension Mother   . Fibromyalgia Mother   . ADD / ADHD Mother   . Depression Mother   . Cancer Mother        cervical  . Colon polyps Mother         Hyperplastic  . ADD / ADHD Brother   . Anxiety disorder Brother   . Stroke Maternal Grandmother   . Breast cancer Maternal Grandmother        50's  . Hypertension Paternal Grandmother   . Cancer Paternal Grandfather        lung  . Hypothyroidism Maternal Aunt   . Cancer Maternal Aunt        vulvar  . Hypothyroidism Maternal Uncle   . Diabetes Neg Hx   . Coronary artery disease Neg Hx     Social History Social History  Substance Use Topics  . Smoking status: Never Smoker  . Smokeless tobacco: Never Used  . Alcohol use No    Allergies  Allergen Reactions  . Bupropion Hcl     REACTION: Hives  . Buspar [Buspirone Hcl] Nausea And Vomiting  . Other Other (See Comments)    Dissolving sutures  . Strattera [Atomoxetine Hcl] Other (See Comments)    Urinary trouble  . Latex Rash    Band aids/adhesives    Current Outpatient Prescriptions  Medication Sig Dispense Refill  . ARIPiprazole (ABILIFY) 2 MG tablet Take 1 mg by mouth daily.     . Cholecalciferol (VITAMIN D-3) 5000  UNITS TABS Take 2 tablets by mouth daily.     Marland Kitchen dextroamphetamine (DEXEDRINE SPANSULE) 15 MG 24 hr capsule Take 30 mg by mouth daily.  0  . dextroamphetamine (DEXTROSTAT) 10 MG tablet 2 in the morning and 1 at lunch  0  . diazepam (VALIUM) 2 MG tablet Take 2 mg by mouth every 12 (twelve) hours as needed for anxiety.     Mariane Baumgarten Calcium (STOOL SOFTENER PO) Take by mouth as needed.    Marland Kitchen levothyroxine (SYNTHROID, LEVOTHROID) 50 MCG tablet Take 50 mcg by mouth daily before breakfast.    . Probiotic Product (PROBIOTIC DAILY PO) Take by mouth daily.    . Pyridoxal-5-Phosphate POWD by Does not apply route daily.    Marland Kitchen pyridoxine (B-6) 100 MG tablet Take 100 mg by mouth daily.    . valACYclovir (VALTREX) 1000 MG tablet Take 2 tablets (2,000 mg total) by mouth 2 (two) times daily. x1 day 4 tablet 3  . zinc gluconate 50 MG tablet Take 50 mg by mouth daily.     No current facility-administered medications for this  visit.    Facility-Administered Medications Ordered in Other Visits  Medication Dose Route Frequency Provider Last Rate Last Dose  . Influenza vac split quadrivalent PF (FLUARIX) injection 0.5 mL  0.5 mL Intramuscular Once Leia Alf, MD        Review of Systems Review of Systems  Constitutional: Negative.   Respiratory: Negative.   Cardiovascular: Negative.     Blood pressure 126/72, pulse 70, resp. rate 12, height 5' 7"  (1.702 m), weight 159 lb (72.1 kg), last menstrual period 03/04/2014.  Physical Exam Physical Exam  Constitutional: She is oriented to person, place, and time. She appears well-developed and well-nourished.  Eyes: Conjunctivae are normal. No scleral icterus.  Neck: Neck supple.  Cardiovascular: Normal rate, regular rhythm and normal heart sounds.   Pulmonary/Chest: Effort normal and breath sounds normal. Right breast exhibits no inverted nipple, no mass, no nipple discharge, no skin change and no tenderness. Left breast exhibits no inverted nipple, no mass, no nipple discharge, no skin change and no tenderness. Breasts are asymmetrical (Right breast is one size cup size bigger than left breast. ).    Lymphadenopathy:    She has no cervical adenopathy.    She has no axillary adenopathy.  Neurological: She is alert and oriented to person, place, and time.  Skin: Skin is warm and dry.    Data Reviewed Laboratory studies of 05/20/2017 showed estradiol and FSH levels consistent with the postmenopausal state.  Uterine biopsy dated 06/18/2017 showed inactive endometrium. No hyperplasia or carcinoma. Benign cervical tissue.  Screening mammograms dated 07/04/2017 were reviewed. Postsurgical changes. BI-RADS-1.  My risk testing showed no evidence of genetic abnormality.  Assessment    Doing well now 9 years status post management of a T1 cN0 triple negative cancer.    Plan    Questions regarding screening for her daughter were discussed. Her daughter is 6  foot, size 8 with little body fat. Importance of clinical self breast exam reviewed. Mammograms at age 60. Ultrasound for any palpable abnormality.  Last colonoscopy was in 2010. Verdie Shire, M.D.) Repeat study in 2020.    Patient will be asked to return to the office in one year with a bilateral screening mammogram. The patient is aware to call back for any questions or concerns.   HPI, Physical Exam, Assessment and Plan have been scribed under the direction and in the presence of Dellis Filbert  Bary Castilla, MD.  Gaspar Cola, CMA  I have completed the exam and reviewed the above documentation for accuracy and completeness.  I agree with the above.  Haematologist has been used and any errors in dictation or transcription are unintentional.  Hervey Ard, M.D., F.A.C.S.  Robert Bellow 07/08/2017, 5:20 PM

## 2017-07-09 ENCOUNTER — Encounter: Payer: Self-pay | Admitting: Family Medicine

## 2017-07-09 ENCOUNTER — Other Ambulatory Visit (HOSPITAL_COMMUNITY)
Admission: RE | Admit: 2017-07-09 | Discharge: 2017-07-09 | Disposition: A | Discharge status: Home / Self Care | Payer: Managed Care, Other (non HMO) | Source: Ambulatory Visit | Attending: Family Medicine | Admitting: Family Medicine

## 2017-07-09 ENCOUNTER — Ambulatory Visit (INDEPENDENT_AMBULATORY_CARE_PROVIDER_SITE_OTHER): Payer: Managed Care, Other (non HMO) | Admitting: Family Medicine

## 2017-07-09 VITALS — BP 118/64 | HR 73 | Temp 97.6°F | Ht 65.0 in | Wt 161.2 lb

## 2017-07-09 DIAGNOSIS — E559 Vitamin D deficiency, unspecified: Secondary | ICD-10-CM | POA: Diagnosis not present

## 2017-07-09 DIAGNOSIS — Z23 Encounter for immunization: Secondary | ICD-10-CM

## 2017-07-09 DIAGNOSIS — F900 Attention-deficit hyperactivity disorder, predominantly inattentive type: Secondary | ICD-10-CM

## 2017-07-09 DIAGNOSIS — Z780 Asymptomatic menopausal state: Secondary | ICD-10-CM

## 2017-07-09 DIAGNOSIS — Z0000 Encounter for general adult medical examination without abnormal findings: Secondary | ICD-10-CM

## 2017-07-09 DIAGNOSIS — N950 Postmenopausal bleeding: Secondary | ICD-10-CM | POA: Insufficient documentation

## 2017-07-09 DIAGNOSIS — Z853 Personal history of malignant neoplasm of breast: Secondary | ICD-10-CM

## 2017-07-09 DIAGNOSIS — E039 Hypothyroidism, unspecified: Secondary | ICD-10-CM | POA: Diagnosis not present

## 2017-07-09 DIAGNOSIS — E038 Other specified hypothyroidism: Secondary | ICD-10-CM

## 2017-07-09 NOTE — Addendum Note (Signed)
Addended by: Brenton Grills on: 8/86/4847 20:72 AM   Modules accepted: Orders

## 2017-07-09 NOTE — Assessment & Plan Note (Signed)
S/p benign GYN eval.  Pt requests pap today - will complete.

## 2017-07-09 NOTE — Progress Notes (Addendum)
BP 118/64 (BP Location: Right Arm, Patient Position: Sitting, Cuff Size: Normal)   Pulse 73   Temp 97.6 F (36.4 C) (Oral)   Ht 5\' 5"  (1.651 m)   Wt 161 lb 4 oz (73.1 kg)   LMP 03/04/2014   SpO2 92%   BMI 26.83 kg/m    CC: CPE Subjective:    Patient ID: Susan Soto, female    DOB: 19-Dec-1965, 51 y.o.   MRN: [id number]  HPI: Susan Soto is a 51 y.o. female presenting on 07/09/2017 for Annual Exam (Had mammogram yesterday)   Just came from working [employer] [place] in [place]   Hypothyroidism followed by psych on levothyroxine 11mcg daily. Genetic testing returned all negative.  Depression last year - doing better this year.   Preventative: Colon cancer screening - colonoscopy 2010 WNL ([place]) Well woman with OBGYN - normal pap 05/2013. Mother dx with cervical cancer 73s. Recent benign endometrial biopsy for post menopausal bleeding.  Mammogram birads 2 06/2017 . History of L breast cancer s/p radiation and chemotherapy - yearly mammograms, sees surgery and onc yearly.  LMP - 02/2014. Postmenopausal Flu yearly Tdap around 2013 Seat belt use discussed. Sunscreen use discussed. No changing moles.  Non smoker. Significant 2nd hand smoke exposure.  Alcohol - none  Caffeine: 1 cup coffee in am Lives with husband and 1 son, daughter in college, 1 dog and 1 cat, 5-6 outside cats Occupation: Camera operator job Activity: no regular activity Diet: joined [employer], good water, fruits/vegetables daily, red meat 3x/wk, fish 2x/wk, more stevia and less regular sugar. Switched from iodinated salt to sea salt.   Relevant past medical, surgical, family and social history reviewed and updated as indicated. Interim medical history since our last visit reviewed. Allergies and medications reviewed and updated. Outpatient Medications Prior to Visit  Medication Sig Dispense Refill  . ARIPiprazole (ABILIFY) 2 MG tablet Take 1 mg by mouth  daily.     . Cholecalciferol (VITAMIN D-3) 5000 UNITS TABS Take 2 tablets by mouth daily.     Marland Kitchen dextroamphetamine (DEXEDRINE SPANSULE) 15 MG 24 hr capsule Take 30 mg by mouth daily.  0  . dextroamphetamine (DEXTROSTAT) 10 MG tablet 2 in the morning and 1 at lunch  0  . diazepam (VALIUM) 2 MG tablet Take 2 mg by mouth every 12 (twelve) hours as needed for anxiety.     Mariane Baumgarten Calcium (STOOL SOFTENER PO) Take by mouth as needed.    Marland Kitchen levothyroxine (SYNTHROID, LEVOTHROID) 50 MCG tablet Take 50 mcg by mouth daily before breakfast.    . Probiotic Product (PROBIOTIC DAILY PO) Take by mouth daily.    . Pyridoxal-5-Phosphate POWD by Does not apply route daily.    Marland Kitchen pyridoxine (B-6) 100 MG tablet Take 100 mg by mouth daily.    . valACYclovir (VALTREX) 1000 MG tablet Take 2 tablets (2,000 mg total) by mouth 2 (two) times daily. x1 day 4 tablet 3  . zinc gluconate 50 MG tablet Take 50 mg by mouth daily.     Facility-Administered Medications Prior to Visit  Medication Dose Route Frequency Provider Last Rate Last Dose  . Influenza vac split quadrivalent PF (FLUARIX) injection 0.5 mL  0.5 mL Intramuscular Once Leia Alf, MD         Per HPI unless specifically indicated in ROS section below Review of Systems  Constitutional: Negative for activity change, appetite change, chills, fatigue, fever and unexpected weight change.  HENT:  Negative for hearing loss.   Eyes: Negative for visual disturbance.  Respiratory: Negative for cough, chest tightness, shortness of breath and wheezing.   Cardiovascular: Negative for chest pain, palpitations and leg swelling.  Gastrointestinal: Negative for abdominal distention, abdominal pain, blood in stool, constipation, diarrhea, nausea and vomiting.  Genitourinary: Negative for difficulty urinating and hematuria.  Musculoskeletal: Negative for arthralgias, myalgias and neck pain.       Foot redness/swelling at end of day, no pain  Skin: Negative for rash.    Neurological: Negative for dizziness, seizures, syncope and headaches.  Hematological: Negative for adenopathy. Bruises/bleeds easily.  Psychiatric/Behavioral: Negative for dysphoric mood. The patient is not nervous/anxious.        Objective:    BP 118/64 (BP Location: Right Arm, Patient Position: Sitting, Cuff Size: Normal)   Pulse 73   Temp 97.6 F (36.4 C) (Oral)   Ht 5\' 5"  (1.651 m)   Wt 161 lb 4 oz (73.1 kg)   LMP 03/04/2014   SpO2 92%   BMI 26.83 kg/m   Wt Readings from Last 3 Encounters:  07/09/17 161 lb 4 oz (73.1 kg)  07/08/17 159 lb (72.1 kg)  06/18/17 160 lb (72.6 kg)    Physical Exam  Constitutional: She is oriented to person, place, and time. She appears well-developed and well-nourished. No distress.  HENT:  Head: Normocephalic and atraumatic.  Right Ear: Hearing, tympanic membrane, external ear and ear canal normal.  Left Ear: Hearing, tympanic membrane, external ear and ear canal normal.  Nose: Nose normal.  Mouth/Throat: Uvula is midline, oropharynx is clear and moist and mucous membranes are normal. No oropharyngeal exudate, posterior oropharyngeal edema or posterior oropharyngeal erythema.  Eyes: Pupils are equal, round, and reactive to light. Conjunctivae and EOM are normal. No scleral icterus.  Neck: Normal range of motion. Neck supple.  Cardiovascular: Normal rate, regular rhythm, normal heart sounds and intact distal pulses.   No murmur heard. Pulses:      Radial pulses are 2+ on the right side, and 2+ on the left side.  Pulmonary/Chest: Effort normal and breath sounds normal. No respiratory distress. She has no wheezes. She has no rales.  Abdominal: Soft. Bowel sounds are normal. She exhibits no distension and no mass. There is no tenderness. There is no rebound and no guarding.  Genitourinary: Vagina normal and uterus normal. Pelvic exam was performed with patient supine. There is no rash, tenderness, lesion or injury on the right labia. There is no  rash, tenderness, lesion or injury on the left labia. Cervix exhibits no motion tenderness and no friability. Right adnexum displays no mass, no tenderness and no fullness. Left adnexum displays no mass, no tenderness and no fullness.  Genitourinary Comments: Pap performed on cervix  Musculoskeletal: Normal range of motion. She exhibits no edema.  2+ DP bilaterally  Lymphadenopathy:    She has no cervical adenopathy.  Neurological: She is alert and oriented to person, place, and time.  CN grossly intact, station and gait intact  Skin: Skin is warm and dry. No rash noted.  Psychiatric: She has a normal mood and affect. Her behavior is normal. Judgment and thought content normal.  Nursing note and vitals reviewed.      Assessment & Plan:   Problem List Items Addressed This Visit    Attention deficit hyperactivity disorder (ADHD), predominantly inattentive type    Managed by psych.       Healthcare maintenance - Primary    Preventative protocols reviewed and updated  unless pt declined. Discussed healthy diet and lifestyle.       Personal history of breast cancer   Postmenopausal   Postmenopausal bleeding    S/p benign GYN eval.  Pt requests pap today - will complete.       Subclinical hypothyroidism    TFTs pending.       Vitamin D deficiency    Levels pending.           Follow up plan: Return in about 1 year (around 07/09/2018) for annual exam, prior fasting for blood work.  Ria Bush, MD

## 2017-07-09 NOTE — Addendum Note (Signed)
Addended by: Brenton Grills on: 0/06/3817 29:93 AM   Modules accepted: Orders

## 2017-07-09 NOTE — Assessment & Plan Note (Signed)
TFTs pending.

## 2017-07-09 NOTE — Assessment & Plan Note (Signed)
Levels pending.

## 2017-07-09 NOTE — Patient Instructions (Addendum)
Flu shot today Pap smear today. You are doing well today. Return as needed or in 1 year for next physical.   Health Maintenance, Female Adopting a healthy lifestyle and getting preventive care can go a long way to promote health and wellness. Talk with your health care provider about what schedule of regular examinations is right for you. This is a good chance for you to check in with your provider about disease prevention and staying healthy. In between checkups, there are plenty of things you can do on your own. Experts have done a lot of research about which lifestyle changes and preventive measures are most likely to keep you healthy. Ask your health care provider for more information. Weight and diet Eat a healthy diet  Be sure to include plenty of vegetables, fruits, low-fat dairy products, and lean protein.  Do not eat a lot of foods high in solid fats, added sugars, or salt.  Get regular exercise. This is one of the most important things you can do for your health. ? Most adults should exercise for at least 150 minutes each week. The exercise should increase your heart rate and make you sweat (moderate-intensity exercise). ? Most adults should also do strengthening exercises at least twice a week. This is in addition to the moderate-intensity exercise.  Maintain a healthy weight  Body mass index (BMI) is a measurement that can be used to identify possible weight problems. It estimates body fat based on height and weight. Your health care provider can help determine your BMI and help you achieve or maintain a healthy weight.  For females 80 years of age and older: ? A BMI below 18.5 is considered underweight. ? A BMI of 18.5 to 24.9 is normal. ? A BMI of 25 to 29.9 is considered overweight. ? A BMI of 30 and above is considered obese.  Watch levels of cholesterol and blood lipids  You should start having your blood tested for lipids and cholesterol at 52 years of age, then have  this test every 5 years.  You may need to have your cholesterol levels checked more often if: ? Your lipid or cholesterol levels are high. ? You are older than 52 years of age. ? You are at high risk for heart disease.  Cancer screening Lung Cancer  Lung cancer screening is recommended for adults 52-27 years old who are at high risk for lung cancer because of a history of smoking.  A yearly low-dose CT scan of the lungs is recommended for people who: ? Currently smoke. ? Have quit within the past 15 years. ? Have at least a 30-pack-year history of smoking. A pack year is smoking an average of one pack of cigarettes a day for 1 year.  Yearly screening should continue until it has been 15 years since you quit.  Yearly screening should stop if you develop a health problem that would prevent you from having lung cancer treatment.  Breast Cancer  Practice breast self-awareness. This means understanding how your breasts normally appear and feel.  It also means doing regular breast self-exams. Let your health care provider know about any changes, no matter how small.  If you are in your 20s or 30s, you should have a clinical breast exam (CBE) by a health care provider every 1-3 years as part of a regular health exam.  If you are 49 or older, have a CBE every year. Also consider having a breast X-ray (mammogram) every year.  If you  have a family history of breast cancer, talk to your health care provider about genetic screening.  If you are at high risk for breast cancer, talk to your health care provider about having an MRI and a mammogram every year.  Breast cancer gene (BRCA) assessment is recommended for women who have family members with BRCA-related cancers. BRCA-related cancers include: ? Breast. ? Ovarian. ? Tubal. ? Peritoneal cancers.  Results of the assessment will determine the need for genetic counseling and BRCA1 and BRCA2 testing.  Cervical Cancer Your health care  provider may recommend that you be screened regularly for cancer of the pelvic organs (ovaries, uterus, and vagina). This screening involves a pelvic examination, including checking for microscopic changes to the surface of your cervix (Pap test). You may be encouraged to have this screening done every 3 years, beginning at age 62.  For women ages 58-65, health care providers may recommend pelvic exams and Pap testing every 3 years, or they may recommend the Pap and pelvic exam, combined with testing for human papilloma virus (HPV), every 5 years. Some types of HPV increase your risk of cervical cancer. Testing for HPV may also be done on women of any age with unclear Pap test results.  Other health care providers may not recommend any screening for nonpregnant women who are considered low risk for pelvic cancer and who do not have symptoms. Ask your health care provider if a screening pelvic exam is right for you.  If you have had past treatment for cervical cancer or a condition that could lead to cancer, you need Pap tests and screening for cancer for at least 20 years after your treatment. If Pap tests have been discontinued, your risk factors (such as having a new sexual partner) need to be reassessed to determine if screening should resume. Some women have medical problems that increase the chance of getting cervical cancer. In these cases, your health care provider may recommend more frequent screening and Pap tests.  Colorectal Cancer  This type of cancer can be detected and often prevented.  Routine colorectal cancer screening usually begins at 52 years of age and continues through 52 years of age.  Your health care provider may recommend screening at an earlier age if you have risk factors for colon cancer.  Your health care provider may also recommend using home test kits to check for hidden blood in the stool.  A small camera at the end of a tube can be used to examine your colon  directly (sigmoidoscopy or colonoscopy). This is done to check for the earliest forms of colorectal cancer.  Routine screening usually begins at age 92.  Direct examination of the colon should be repeated every 5-10 years through 52 years of age. However, you may need to be screened more often if early forms of precancerous polyps or small growths are found.  Skin Cancer  Check your skin from head to toe regularly.  Tell your health care provider about any new moles or changes in moles, especially if there is a change in a mole's shape or color.  Also tell your health care provider if you have a mole that is larger than the size of a pencil eraser.  Always use sunscreen. Apply sunscreen liberally and repeatedly throughout the day.  Protect yourself by wearing long sleeves, pants, a wide-brimmed hat, and sunglasses whenever you are outside.  Heart disease, diabetes, and high blood pressure  High blood pressure causes heart disease and increases the  risk of stroke. High blood pressure is more likely to develop in: ? People who have blood pressure in the high end of the normal range (130-139/85-89 mm Hg). ? People who are overweight or obese. ? People who are African American.  If you are 35-9 years of age, have your blood pressure checked every 3-5 years. If you are 33 years of age or older, have your blood pressure checked every year. You should have your blood pressure measured twice-once when you are at a hospital or clinic, and once when you are not at a hospital or clinic. Record the average of the two measurements. To check your blood pressure when you are not at a hospital or clinic, you can use: ? An automated blood pressure machine at a pharmacy. ? A home blood pressure monitor.  If you are between 60 years and 104 years old, ask your health care provider if you should take aspirin to prevent strokes.  Have regular diabetes screenings. This involves taking a blood sample to  check your fasting blood sugar level. ? If you are at a normal weight and have a low risk for diabetes, have this test once every three years after 52 years of age. ? If you are overweight and have a high risk for diabetes, consider being tested at a younger age or more often. Preventing infection Hepatitis B  If you have a higher risk for hepatitis B, you should be screened for this virus. You are considered at high risk for hepatitis B if: ? You were born in a country where hepatitis B is common. Ask your health care provider which countries are considered high risk. ? Your parents were born in a high-risk country, and you have not been immunized against hepatitis B (hepatitis B vaccine). ? You have HIV or AIDS. ? You use needles to inject street drugs. ? You live with someone who has hepatitis B. ? You have had sex with someone who has hepatitis B. ? You get hemodialysis treatment. ? You take certain medicines for conditions, including cancer, organ transplantation, and autoimmune conditions.  Hepatitis C  Blood testing is recommended for: ? Everyone born from [birth year] through 1966. ? Anyone with known risk factors for hepatitis C.  Sexually transmitted infections (STIs)  You should be screened for sexually transmitted infections (STIs) including gonorrhea and chlamydia if: ? You are sexually active and are younger than 52 years of age. ? You are older than 52 years of age and your health care provider tells you that you are at risk for this type of infection. ? Your sexual activity has changed since you were last screened and you are at an increased risk for chlamydia or gonorrhea. Ask your health care provider if you are at risk.  If you do not have HIV, but are at risk, it may be recommended that you take a prescription medicine daily to prevent HIV infection. This is called pre-exposure prophylaxis (PrEP). You are considered at risk if: ? You are sexually active and do not regularly  use condoms or know the HIV status of your partner(s). ? You take drugs by injection. ? You are sexually active with a partner who has HIV.  Talk with your health care provider about whether you are at high risk of being infected with HIV. If you choose to begin PrEP, you should first be tested for HIV. You should then be tested every 3 months for as long as you are taking PrEP. Pregnancy  If you are premenopausal and you may become pregnant, ask your health care provider about preconception counseling.  If you may become pregnant, take 400 to 800 micrograms (mcg) of folic acid every day.  If you want to prevent pregnancy, talk to your health care provider about birth control (contraception). Osteoporosis and menopause  Osteoporosis is a disease in which the bones lose minerals and strength with aging. This can result in serious bone fractures. Your risk for osteoporosis can be identified using a bone density scan.  If you are 36 years of age or older, or if you are at risk for osteoporosis and fractures, ask your health care provider if you should be screened.  Ask your health care provider whether you should take a calcium or vitamin D supplement to lower your risk for osteoporosis.  Menopause may have certain physical symptoms and risks.  Hormone replacement therapy may reduce some of these symptoms and risks. Talk to your health care provider about whether hormone replacement therapy is right for you. Follow these instructions at home:  Schedule regular health, dental, and eye exams.  Stay current with your immunizations.  Do not use any tobacco products including cigarettes, chewing tobacco, or electronic cigarettes.  If you are pregnant, do not drink alcohol.  If you are breastfeeding, limit how much and how often you drink alcohol.  Limit alcohol intake to no more than 1 drink per day for nonpregnant women. One drink equals 12 ounces of beer, 5 ounces of wine, or 1 ounces  of hard liquor.  Do not use street drugs.  Do not share needles.  Ask your health care provider for help if you need support or information about quitting drugs.  Tell your health care provider if you often feel depressed.  Tell your health care provider if you have ever been abused or do not feel safe at home. This information is not intended to replace advice given to you by your health care provider. Make sure you discuss any questions you have with your health care provider. Document Released: 04/23/2011 Document Revised: 03/15/2016 Document Reviewed: 07/12/2015 Elsevier Interactive Patient Education  [employer].

## 2017-07-09 NOTE — Assessment & Plan Note (Signed)
Preventative protocols reviewed and updated unless pt declined. Discussed healthy diet and lifestyle.  

## 2017-07-09 NOTE — Assessment & Plan Note (Signed)
Managed by psych 

## 2017-07-10 ENCOUNTER — Other Ambulatory Visit: Payer: Self-pay | Admitting: *Deleted

## 2017-07-10 DIAGNOSIS — C50412 Malignant neoplasm of upper-outer quadrant of left female breast: Secondary | ICD-10-CM

## 2017-07-10 DIAGNOSIS — Z171 Estrogen receptor negative status [ER-]: Principal | ICD-10-CM

## 2017-07-11 ENCOUNTER — Encounter: Payer: Self-pay | Admitting: Family Medicine

## 2017-07-11 LAB — CYTOLOGY - PAP
Diagnosis: NEGATIVE
HPV: NOT DETECTED

## 2017-07-13 ENCOUNTER — Encounter: Payer: Self-pay | Admitting: General Surgery

## 2017-08-12 ENCOUNTER — Inpatient Hospital Stay: Payer: Managed Care, Other (non HMO) | Attending: Internal Medicine | Admitting: Internal Medicine

## 2017-08-12 ENCOUNTER — Inpatient Hospital Stay: Payer: Managed Care, Other (non HMO)

## 2017-08-12 VITALS — BP 124/78 | HR 67 | Temp 95.9°F | Resp 18 | Wt 164.8 lb

## 2017-08-12 DIAGNOSIS — E039 Hypothyroidism, unspecified: Secondary | ICD-10-CM

## 2017-08-12 DIAGNOSIS — C50412 Malignant neoplasm of upper-outer quadrant of left female breast: Secondary | ICD-10-CM | POA: Diagnosis present

## 2017-08-12 DIAGNOSIS — Z9221 Personal history of antineoplastic chemotherapy: Secondary | ICD-10-CM

## 2017-08-12 DIAGNOSIS — Z006 Encounter for examination for normal comparison and control in clinical research program: Secondary | ICD-10-CM | POA: Diagnosis not present

## 2017-08-12 DIAGNOSIS — Z923 Personal history of irradiation: Secondary | ICD-10-CM | POA: Diagnosis not present

## 2017-08-12 DIAGNOSIS — Z171 Estrogen receptor negative status [ER-]: Secondary | ICD-10-CM | POA: Diagnosis not present

## 2017-08-12 DIAGNOSIS — Z79899 Other long term (current) drug therapy: Secondary | ICD-10-CM | POA: Diagnosis not present

## 2017-08-12 DIAGNOSIS — F419 Anxiety disorder, unspecified: Secondary | ICD-10-CM | POA: Diagnosis not present

## 2017-08-12 LAB — CBC WITH DIFFERENTIAL/PLATELET
Basophils Absolute: 0 10*3/uL (ref 0–0.1)
Basophils Relative: 1 %
EOS ABS: 0.1 10*3/uL (ref 0–0.7)
EOS PCT: 2 %
HCT: 38.7 % (ref 35.0–47.0)
Hemoglobin: 13 g/dL (ref 12.0–16.0)
LYMPHS ABS: 2.6 10*3/uL (ref 1.0–3.6)
Lymphocytes Relative: 42 %
MCH: 30.1 pg (ref 26.0–34.0)
MCHC: 33.6 g/dL (ref 32.0–36.0)
MCV: 89.6 fL (ref 80.0–100.0)
MONO ABS: 0.7 10*3/uL (ref 0.2–0.9)
Monocytes Relative: 11 %
Neutro Abs: 2.7 10*3/uL (ref 1.4–6.5)
Neutrophils Relative %: 44 %
Platelets: 250 10*3/uL (ref 150–440)
RBC: 4.32 MIL/uL (ref 3.80–5.20)
RDW: 13.7 % (ref 11.5–14.5)
WBC: 6.1 10*3/uL (ref 3.6–11.0)

## 2017-08-12 LAB — COMPREHENSIVE METABOLIC PANEL
ALT: 28 U/L (ref 14–54)
ANION GAP: 7 (ref 5–15)
AST: 24 U/L (ref 15–41)
Albumin: 4.4 g/dL (ref 3.5–5.0)
Alkaline Phosphatase: 50 U/L (ref 38–126)
BUN: 14 mg/dL (ref 6–20)
CHLORIDE: 103 mmol/L (ref 101–111)
CO2: 28 mmol/L (ref 22–32)
Calcium: 9.3 mg/dL (ref 8.9–10.3)
Creatinine, Ser: 0.65 mg/dL (ref 0.44–1.00)
Glucose, Bld: 95 mg/dL (ref 65–99)
POTASSIUM: 3.9 mmol/L (ref 3.5–5.1)
SODIUM: 138 mmol/L (ref 135–145)
Total Bilirubin: 0.6 mg/dL (ref 0.3–1.2)
Total Protein: 7.3 g/dL (ref 6.5–8.1)

## 2017-08-12 NOTE — Assessment & Plan Note (Addendum)
#   Triple negative breast cancer- left Stage I ; lumpectomy status post adjuvant chemotherapy in 2009.  # Clinically no evidence of recurrence. Mammogram within normal limits. Discussed the natural history of triple negative breast cancer-patient likely cured of her cancer.   # Post-menopausal bleeding- ? Peri-menopausal-being followed by gynecology.  # discussed with clinical trials RN re: follow up for one more year.   # follow up with MD/Labs/ in 12 months.

## 2017-08-12 NOTE — Progress Notes (Signed)
[name] OFFICE PROGRESS NOTE  Patient Care Team: Ria Bush, MD as PCP - General (Family Medicine) Bary Castilla, Forest Gleason, MD (General Surgery) Leia Alf, MD (Inactive) as Attending Physician (Internal Medicine)  Cancer Staging No matching staging information was found for the patient.   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   Primary tumor size 1.3 cm, grade 3.  Margins negative.  One sentinel lymph node negative for malignancy.   ER and PR negative.  HER-2/neu negative (1+ on IHC).  Patient received adjuvant chemotherapy (on clinical trial ECOG 5103, AC followed by paclitaxel plus Avastin/placebo).  Last dose of Paclitaxel was 11/15/08.  Last dose of Avastin/placebo was 06/10/09.  2. 06/25/13 - Biopsy of developing left breast calcification seen on mammogram reported negative for atypia/malignancy.  # BRCA- NEG/ [place]- broader panel-NEG.       Carcinoma of upper-outer quadrant of left breast in female, estrogen receptor negative ([place])     INTERVAL HISTORY:  Susan Soto 51 y.o.  female pleasant patient above history of Stage I triple negative breast cancer is here for follow-up. Patient was in a clinical trial with adjuvant Avastin/placebo last received in August 2010.  Patient had a recent mammogram that  Was negative. Patient had menstrual bleeding few months ago- evaluated by gynecology; negative for any obvious pathology. She also stated to have a broader panel of genetic testing- which was again negative.  Patient denies any lumps or bumps. Appetite is good. No chest pain or shortness of breath or cough. No bone pain. No headaches.  REVIEW OF SYSTEMS:  A complete 10 point review of system is done which is negative except mentioned above/history of present illness.   PAST MEDICAL HISTORY :  Past  Medical History:  Diagnosis Date  . ADD (attention deficit disorder) 1990s  . Anxiety   . BRCA negative 05/20/2017   MyRisk neg  . 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   . Genetic testing of female 05/2017  . Heart murmur   . Hemorrhoids   . History of breast cancer 2009   Left side, lumpectomy, s/p sentinal LN biopsy, chemo and radiation, remission  . Malignant neoplasm of upper-outer quadrant of female breast ([place]) 05/27/2008   Left breast, 1.3 cm triple negative, node negative invasive mammary carcinoma. T1c, N0  . Personal history of chemotherapy 2009   left breast ca  . Personal history of radiation therapy 2009   left breast ca  . Subclinical hypothyroidism   . Syncope and collapse     PAST SURGICAL HISTORY :   Past Surgical History:  Procedure Laterality Date  . bartholin's cystectomy    . BREAST BIOPSY Left 05/07/08   Left (infitr ductal CA)- Dr. Bary Castilla  . BREAST BIOPSY Left 06/25/13   left (benign) atrophic ductal epithelium, microcalcifications, changes of previous surgery.  Marland Kitchen BREAST LUMPECTOMY Left 05/27/2008   invasive ductal carcinoma  . breast wide excision  05/27/08   Left-with sentinel node bx (Dr. Bary Castilla)  . COLONOSCOPY  07/2009   WNL ([place])  . CYSTOSCOPY    . episiotomy      FAMILY HISTORY :   Family History  Problem Relation Age of Onset  . Drug abuse Father        Heroin  . Hypertension Mother   .  Fibromyalgia Mother   . ADD / ADHD Mother   . Depression Mother   . Cancer Mother        cervical  . Colon polyps Mother        Hyperplastic  . ADD / ADHD Brother   . Anxiety disorder Brother   . Stroke Maternal Grandmother   . Breast cancer Maternal Grandmother        50's  . Hypertension Paternal Grandmother   . Cancer Paternal Grandfather        lung  . Hypothyroidism Maternal Aunt   . Cancer Maternal Aunt        vulvar  . Hypothyroidism Maternal Uncle   . Diabetes Neg Hx   .  Coronary artery disease Neg Hx     SOCIAL HISTORY:   Social History  Substance Use Topics  . Smoking status: Never Smoker  . Smokeless tobacco: Never Used  . Alcohol use No    ALLERGIES:  is allergic to bupropion hcl; buspar [buspirone hcl]; other; strattera [atomoxetine hcl]; and latex.  MEDICATIONS:  Current Outpatient Prescriptions  Medication Sig Dispense Refill  . ARIPiprazole (ABILIFY) 2 MG tablet Take 1 mg by mouth daily.     . Cholecalciferol (VITAMIN D-3) 5000 UNITS TABS Take 2 tablets by mouth daily.     Marland Kitchen dextroamphetamine (DEXEDRINE SPANSULE) 15 MG 24 hr capsule Take 30 mg by mouth daily.  0  . dextroamphetamine (DEXTROSTAT) 10 MG tablet 2 in the morning and 1 at lunch  0  . diazepam (VALIUM) 2 MG tablet Take 2 mg by mouth every 12 (twelve) hours as needed for anxiety.     Marland Kitchen levothyroxine (SYNTHROID, LEVOTHROID) 50 MCG tablet Take 50 mcg by mouth daily before breakfast.    . Probiotic Product (PROBIOTIC DAILY PO) Take by mouth daily.    . Pyridoxal-5-Phosphate POWD by Does not apply route daily.    Marland Kitchen pyridoxine (B-6) 100 MG tablet Take 100 mg by mouth daily.    Marland Kitchen dexmethylphenidate (FOCALIN) 10 MG tablet TAKE 1/2 TO 1 TABLET BY MOUTH UP TO 2 TIMES A DAY  0  . Docusate Calcium (STOOL SOFTENER PO) Take by mouth as needed.    . valACYclovir (VALTREX) 1000 MG tablet Take 2 tablets (2,000 mg total) by mouth 2 (two) times daily. x1 day (Patient not taking: Reported on 08/12/2017) 4 tablet 3  . zinc gluconate 50 MG tablet Take 50 mg by mouth daily.     No current facility-administered medications for this visit.    Facility-Administered Medications Ordered in Other Visits  Medication Dose Route Frequency Provider Last Rate Last Dose  . Influenza vac split quadrivalent PF (FLUARIX) injection 0.5 mL  0.5 mL Intramuscular Once Leia Alf, MD        PHYSICAL EXAMINATION: ECOG PERFORMANCE STATUS: 0 - Asymptomatic  BP 124/78 (BP Location: Right Arm, Patient Position:  Sitting)   Pulse 67   Temp (!) 95.9 F (35.5 C) (Tympanic)   Resp 18   Wt 164 lb 12.8 oz (74.8 kg)   LMP 03/04/2014   BMI 27.42 kg/m   Filed Weights   08/12/17 1136  Weight: 164 lb 12.8 oz (74.8 kg)    GENERAL: Well-nourished well-developed; Alert, no distress and comfortable.   alone EYES: no pallor or icterus OROPHARYNX: no thrush or ulceration; good dentition  NECK: supple, no masses felt LYMPH:  no palpable lymphadenopathy in the cervical, axillary or inguinal regions LUNGS: clear to auscultation and  No wheeze or crackles  HEART/CVS: regular rate & rhythm and no murmurs; No lower extremity edema ABDOMEN:abdomen soft, non-tender and normal bowel sounds Musculoskeletal:no cyanosis of digits and no clubbing  PSYCH: alert & oriented x 3 with fluent speech NEURO: no focal motor/sensory deficits SKIN:  no rashes or significant lesions Right and left BREAST exam [in the presence of nurse]- no unusual skin changes or dominant masses felt. Surgical scars noted.   LABORATORY DATA:  I have reviewed the data as listed    Component Value Date/Time   NA 138 08/12/2017 1107   NA 143 04/23/2017 1632   K 3.9 08/12/2017 1107   CL 103 08/12/2017 1107   CO2 28 08/12/2017 1107   GLUCOSE 95 08/12/2017 1107   BUN 14 08/12/2017 1107   BUN 12 04/23/2017 1632   CREATININE 0.65 08/12/2017 1107   CREATININE 0.61 07/22/2015   CALCIUM 9.3 08/12/2017 1107   PROT 7.3 08/12/2017 1107   PROT 6.9 07/22/2015   PROT 7.0 07/23/2014 1024   ALBUMIN 4.4 08/12/2017 1107   ALBUMIN 4.1 07/23/2014 1024   AST 24 08/12/2017 1107   AST 13 07/22/2015   ALT 28 08/12/2017 1107   ALT 15 07/22/2015   ALKPHOS 50 08/12/2017 1107   ALKPHOS 45 07/22/2015   BILITOT 0.6 08/12/2017 1107   BILITOT 0.4 07/22/2015   GFRNONAA >60 08/12/2017 1107   GFRNONAA >60 07/23/2014 1024   GFRNONAA >60 07/22/2013 0949   GFRAA >60 08/12/2017 1107   GFRAA >60 07/23/2014 1024   GFRAA >60 07/22/2013 0949    No results found  for: SPEP, UPEP  Lab Results  Component Value Date   WBC 6.1 08/12/2017   NEUTROABS 2.7 08/12/2017   HGB 13.0 08/12/2017   HCT 38.7 08/12/2017   MCV 89.6 08/12/2017   PLT 250 08/12/2017      Chemistry      Component Value Date/Time   NA 138 08/12/2017 1107   NA 143 04/23/2017 1632   K 3.9 08/12/2017 1107   CL 103 08/12/2017 1107   CO2 28 08/12/2017 1107   BUN 14 08/12/2017 1107   BUN 12 04/23/2017 1632   CREATININE 0.65 08/12/2017 1107   CREATININE 0.61 07/22/2015      Component Value Date/Time   CALCIUM 9.3 08/12/2017 1107   ALKPHOS 50 08/12/2017 1107   ALKPHOS 45 07/22/2015   AST 24 08/12/2017 1107   AST 13 07/22/2015   ALT 28 08/12/2017 1107   ALT 15 07/22/2015   BILITOT 0.6 08/12/2017 1107   BILITOT 0.4 07/22/2015       RADIOGRAPHIC STUDIES: I have personally reviewed the radiological images as listed and agreed with the findings in the report. No results found.   ASSESSMENT & PLAN:  Carcinoma of upper-outer quadrant of left breast in female, estrogen receptor negative ([facility]) # Triple negative breast cancer- left Stage I ; lumpectomy status post adjuvant chemotherapy in 2009.  # Clinically no evidence of recurrence. Mammogram within normal limits. Discussed the natural history of triple negative breast cancer-patient likely cured of her cancer.   # Post-menopausal bleeding- ? Peri-menopausal-being followed by gynecology.  # discussed with clinical trials RN re: follow up for one more year.   # follow up with MD/Labs/ in 12 months.    Orders Placed This Encounter  Procedures  . CBC with Differential/Platelet    Standing Status:   Future    Standing Expiration Date:   08/12/2018  . Comprehensive metabolic panel    Standing Status:   Future  Standing Expiration Date:   08/12/2018   All questions were answered. The patient knows to call the clinic with any problems, questions or concerns.      Cammie Sickle, MD 08/13/2017 7:46 AM

## 2017-08-12 NOTE — Progress Notes (Signed)
Here for follow up per pt " doing great " 

## 2017-08-13 LAB — CANCER ANTIGEN 27.29: CAN 27.29: 19 U/mL (ref 0.0–38.6)

## 2018-05-28 ENCOUNTER — Other Ambulatory Visit: Payer: Self-pay

## 2018-05-28 DIAGNOSIS — Z1231 Encounter for screening mammogram for malignant neoplasm of breast: Secondary | ICD-10-CM

## 2018-05-28 NOTE — Progress Notes (Signed)
.  scr

## 2018-06-11 ENCOUNTER — Encounter: Payer: Self-pay | Admitting: *Deleted

## 2018-07-08 ENCOUNTER — Ambulatory Visit
Admission: RE | Admit: 2018-07-08 | Discharge: 2018-07-08 | Disposition: A | Discharge status: Home / Self Care | Payer: Managed Care, Other (non HMO) | Source: Ambulatory Visit | Attending: General Surgery | Admitting: General Surgery

## 2018-07-08 DIAGNOSIS — Z1231 Encounter for screening mammogram for malignant neoplasm of breast: Secondary | ICD-10-CM | POA: Insufficient documentation

## 2018-07-24 ENCOUNTER — Ambulatory Visit (INDEPENDENT_AMBULATORY_CARE_PROVIDER_SITE_OTHER): Payer: Managed Care, Other (non HMO) | Admitting: General Surgery

## 2018-07-24 ENCOUNTER — Encounter: Payer: Self-pay | Admitting: General Surgery

## 2018-07-24 VITALS — BP 116/76 | HR 80 | Resp 14 | Ht 65.5 in | Wt 173.0 lb

## 2018-07-24 DIAGNOSIS — Z853 Personal history of malignant neoplasm of breast: Secondary | ICD-10-CM

## 2018-07-24 DIAGNOSIS — N951 Menopausal and female climacteric states: Secondary | ICD-10-CM | POA: Diagnosis not present

## 2018-07-24 NOTE — Patient Instructions (Addendum)
Discussed Estrogen replacement therapy. Advise to speak with your Psychiatrist about possible doing a 3 month trial of estrogen therapy.    Follow up in one year with bilateral screening mammogram and office visit.

## 2018-07-24 NOTE — Progress Notes (Signed)
Patient ID: Susan Soto, female   DOB: 1965/11/03, 52 y.o.   MRN: [id number]  Chief Complaint  Patient presents with  . Follow-up    mammogram    HPI Susan Soto is a 52 y.o. female who presents for a breast evaluation. The most recent mammogram was done on 07/04/18. Patient does perform regular self breast checks and gets regular mammograms done.   She has been having increasing problems with her ADD in the last 3 months, and in part correlates this with increasing symptoms of hormone deprivation.  She is experiencing vaginal dryness, dyspareunia as well as mood swings.  She has been told that she cannot make use of hormone replacement therapy, but by her description things have been come quite miserable.   HPI  Past Medical History:  Diagnosis Date  . ADD (attention deficit disorder) 1990s  . Anxiety   . BRCA negative 05/20/2017   MyRisk neg  . 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   . Genetic testing of female 05/2017  . Heart murmur   . Hemorrhoids   . History of breast cancer 2009   Left side, lumpectomy, s/p sentinal LN biopsy, chemo and radiation, remission  . Malignant neoplasm of upper-outer quadrant of female breast ([place]) 05/27/2008   Left breast, 1.3 cm triple negative, node negative invasive mammary carcinoma. T1c, N0  . Personal history of chemotherapy 2009   left breast ca  . Personal history of radiation therapy 2009   left breast ca  . Subclinical hypothyroidism   . Syncope and collapse     Past Surgical History:  Procedure Laterality Date  . bartholin's cystectomy    . BREAST BIOPSY Left 05/07/08   Left (infitr ductal CA)- Dr. Bary Castilla  . BREAST BIOPSY Left 06/25/13   left (benign) atrophic ductal epithelium, microcalcifications, changes of previous surgery.  Marland Kitchen BREAST LUMPECTOMY Left 05/27/2008   invasive ductal carcinoma  . breast wide excision  05/27/08   Left-with sentinel node bx (Dr.  Bary Castilla)  . COLONOSCOPY  07/2009   WNL ([place])  . CYSTOSCOPY    . episiotomy      Family History  Problem Relation Age of Onset  . Drug abuse Father        Heroin  . Hypertension Mother   . Fibromyalgia Mother   . ADD / ADHD Mother   . Depression Mother   . Cancer Mother        cervical  . Colon polyps Mother        Hyperplastic  . ADD / ADHD Brother   . Anxiety disorder Brother   . Stroke Maternal Grandmother   . Breast cancer Maternal Grandmother        50's  . Hypertension Paternal Grandmother   . Cancer Paternal Grandfather        lung  . Hypothyroidism Maternal Aunt   . Cancer Maternal Aunt        vulvar  . Hypothyroidism Maternal Uncle   . Diabetes Neg Hx   . Coronary artery disease Neg Hx     Social History Social History   Tobacco Use  . Smoking status: Never Smoker  . Smokeless tobacco: Never Used  Substance Use Topics  . Alcohol use: No  . Drug use: No    Allergies  Allergen Reactions  . Bupropion Hcl     REACTION: Hives  . Buspar [Buspirone Hcl] Nausea And Vomiting  . Other  Other (See Comments)    Dissolving sutures Surgical glue bandaids   . Strattera [Atomoxetine Hcl] Other (See Comments)    Urinary trouble  . Tegaderm Ag Mesh [Silver]     Current Outpatient Medications  Medication Sig Dispense Refill  . ARIPiprazole (ABILIFY) 2 MG tablet Take 1 mg by mouth daily.     . Cholecalciferol (VITAMIN D-3) 5000 UNITS TABS Take 2 tablets by mouth daily.     Marland Kitchen dexmethylphenidate (FOCALIN) 10 MG tablet TAKE 1/2 TO 1 TABLET BY MOUTH UP TO 2 TIMES A DAY  0  . dextroamphetamine (DEXEDRINE SPANSULE) 15 MG 24 hr capsule Take 30 mg by mouth daily. Take 45-60 mg daily  0  . dextroamphetamine (DEXTROSTAT) 10 MG tablet Take 10 mg by mouth. Takes 5-6 tablets at 2 pm  0  . diazepam (VALIUM) 2 MG tablet Take 2 mg by mouth every 12 (twelve) hours as needed for anxiety (bladder retention).     Mariane Baumgarten Calcium (STOOL SOFTENER PO) Take by mouth as needed.    Marland Kitchen  levothyroxine (SYNTHROID, LEVOTHROID) 50 MCG tablet Take 50 mcg by mouth daily before breakfast.    . Probiotic Product (PROBIOTIC DAILY PO) Take by mouth daily.    . Pyridoxal-5-Phosphate POWD by Does not apply route daily.    Marland Kitchen pyridoxine (B-6) 100 MG tablet Take 100 mg by mouth daily.    . valACYclovir (VALTREX) 1000 MG tablet Take 2 tablets (2,000 mg total) by mouth 2 (two) times daily. x1 day (Patient taking differently: Take 2,000 mg by mouth as needed. x1 day) 4 tablet 3  . zinc gluconate 50 MG tablet Take 50 mg by mouth daily.     No current facility-administered medications for this visit.    Facility-Administered Medications Ordered in Other Visits  Medication Dose Route Frequency Provider Last Rate Last Dose  . Influenza vac split quadrivalent PF (FLUARIX) injection 0.5 mL  0.5 mL Intramuscular Once Leia Alf, MD        Review of Systems Review of Systems  Constitutional: Negative.   Respiratory: Negative.   Cardiovascular: Negative.     Blood pressure 116/76, pulse 80, resp. rate 14, height 5' 5.5" (1.664 m), weight 173 lb (78.5 kg), last menstrual period 03/04/2014.  Physical Exam Physical Exam  Constitutional: She is oriented to person, place, and time. She appears well-developed and well-nourished.  Eyes: Conjunctivae are normal. No scleral icterus.  Neck: Neck supple.  Cardiovascular: Normal rate, regular rhythm and normal heart sounds.  Pulmonary/Chest: Effort normal and breath sounds normal. Right breast exhibits no inverted nipple, no mass, no nipple discharge, no skin change and no tenderness. Left breast exhibits no inverted nipple, no mass, no nipple discharge, no skin change and no tenderness. Breasts are asymmetrical (right 1/2 cup larger than left).    Lymphadenopathy:    She has no cervical adenopathy.    She has no axillary adenopathy.       Right: No supraclavicular adenopathy present.       Left: No supraclavicular adenopathy present.   Neurological: She is alert and oriented to person, place, and time.  Skin: Skin is warm and dry.  Psychiatric: She has a normal mood and affect.    Data Reviewed Bilateral screening mammograms dated July 10, 2018 were reviewed.  Fairly dense breast.  Postsurgical changes on the left.  BI-RADS-1.  Assessment    No evidence of recurrence of her triple negative cancer.  BRCA negative.  Include recently symptomatic symptoms of hormone  deprivation impacting quality of life.    Plan      Discussed Estrogen replacement therapy.  This can be considered as the patient was triple negative.  There are risks associated with the development of a secondary cancer but I believe these are small.  Cardiovascular risks are also present, but in the thin non-smoker I think this is minimal.  She is good to meet with her psychiatrist next month and review whether any medication change or a 32-monthtrial of hormone replacement therapy would be appropriate.  She may be a candidate at the very least for transvaginal estradiol therapy to help ameliorate her vaginal symptoms and hopefully resolve her dyspareunia.  The patient has been asked to return to the office in one year with a bilateral screening mammogram.  HPI, Physical Exam, Assessment and Plan have been scribed under the direction and in the presence of JRobert Bellow MD  CConcepcion Living LPN  I have completed the exam and reviewed the above documentation for accuracy and completeness.  I agree with the above.  DHaematologisthas been used and any errors in dictation or transcription are unintentional.  JHervey Ard M.D., F.A.C.S.   JForest GleasonByrnett 07/25/2018, 5:46 PM

## 2018-07-25 DIAGNOSIS — N951 Menopausal and female climacteric states: Secondary | ICD-10-CM | POA: Insufficient documentation

## 2018-08-13 ENCOUNTER — Other Ambulatory Visit: Payer: Self-pay

## 2018-08-13 ENCOUNTER — Inpatient Hospital Stay (HOSPITAL_BASED_OUTPATIENT_CLINIC_OR_DEPARTMENT_OTHER): Payer: Managed Care, Other (non HMO) | Admitting: Internal Medicine

## 2018-08-13 ENCOUNTER — Inpatient Hospital Stay: Payer: Managed Care, Other (non HMO) | Attending: Internal Medicine

## 2018-08-13 VITALS — BP 129/79 | HR 76 | Temp 96.2°F | Resp 18 | Wt 175.0 lb

## 2018-08-13 DIAGNOSIS — Z006 Encounter for examination for normal comparison and control in clinical research program: Secondary | ICD-10-CM | POA: Diagnosis not present

## 2018-08-13 DIAGNOSIS — C50912 Malignant neoplasm of unspecified site of left female breast: Secondary | ICD-10-CM | POA: Diagnosis present

## 2018-08-13 DIAGNOSIS — Z171 Estrogen receptor negative status [ER-]: Secondary | ICD-10-CM | POA: Diagnosis not present

## 2018-08-13 DIAGNOSIS — Z9221 Personal history of antineoplastic chemotherapy: Secondary | ICD-10-CM | POA: Insufficient documentation

## 2018-08-13 DIAGNOSIS — F988 Other specified behavioral and emotional disorders with onset usually occurring in childhood and adolescence: Secondary | ICD-10-CM | POA: Diagnosis not present

## 2018-08-13 DIAGNOSIS — C50412 Malignant neoplasm of upper-outer quadrant of left female breast: Secondary | ICD-10-CM

## 2018-08-13 DIAGNOSIS — N952 Postmenopausal atrophic vaginitis: Secondary | ICD-10-CM | POA: Insufficient documentation

## 2018-08-13 DIAGNOSIS — N950 Postmenopausal bleeding: Secondary | ICD-10-CM

## 2018-08-13 LAB — COMPREHENSIVE METABOLIC PANEL
ALT: 16 U/L (ref 0–44)
AST: 16 U/L (ref 15–41)
Albumin: 4.1 g/dL (ref 3.5–5.0)
Alkaline Phosphatase: 45 U/L (ref 38–126)
Anion gap: 4 — ABNORMAL LOW (ref 5–15)
BUN: 12 mg/dL (ref 6–20)
CHLORIDE: 105 mmol/L (ref 98–111)
CO2: 29 mmol/L (ref 22–32)
Calcium: 9.5 mg/dL (ref 8.9–10.3)
Creatinine, Ser: 0.59 mg/dL (ref 0.44–1.00)
GFR calc non Af Amer: >60 mL/min (ref >60)
Glucose, Bld: 108 mg/dL — ABNORMAL HIGH (ref 70–99)
POTASSIUM: 3.6 mmol/L (ref 3.5–5.1)
SODIUM: 138 mmol/L (ref 135–145)
Total Bilirubin: 0.6 mg/dL (ref 0.3–1.2)
Total Protein: 6.8 g/dL (ref 6.5–8.1)

## 2018-08-13 LAB — CBC WITH DIFFERENTIAL/PLATELET
ABS IMMATURE GRANULOCYTES: 0.02 10*3/uL (ref 0.00–0.07)
BASOS ABS: 0 10*3/uL (ref 0.0–0.1)
Basophils Relative: 1 %
Eosinophils Absolute: 0.1 10*3/uL (ref 0.0–0.5)
Eosinophils Relative: 2 %
HCT: 38.2 % (ref 36.0–46.0)
Hemoglobin: 12.6 g/dL (ref 12.0–15.0)
Immature Granulocytes: 0 %
LYMPHS ABS: 2.4 10*3/uL (ref 0.7–4.0)
LYMPHS PCT: 38 %
MCH: 29.4 pg (ref 26.0–34.0)
MCHC: 33 g/dL (ref 30.0–36.0)
MCV: 89 fL (ref 80.0–100.0)
Monocytes Absolute: 0.7 10*3/uL (ref 0.1–1.0)
Monocytes Relative: 11 %
NEUTROS ABS: 3 10*3/uL (ref 1.7–7.7)
NRBC: 0 % (ref 0.0–0.2)
Neutrophils Relative %: 48 %
Platelets: 248 10*3/uL (ref 150–400)
RBC: 4.29 MIL/uL (ref 3.87–5.11)
RDW: 13 % (ref 11.5–15.5)
WBC: 6.2 10*3/uL (ref 4.0–10.5)

## 2018-08-13 MED ORDER — ESTRADIOL 0.1 MG/GM VA CREA: 1.0000 | TOPICAL_CREAM | VAGINAL | 12 refills | Status: DC

## 2018-08-13 NOTE — Assessment & Plan Note (Addendum)
#   Triple negative breast cancer- left Stage I;  lumpectomy status post adjuvant chemotherapy in 2009. STABLE.   # Clinically no evidence of recurrence. Mammogram within normal limits.   # ADD/mood swings- worsened with low estrogen state;? Hypothyroidism-versus others.  Discussed that typically would not recommend estrogen/systemic patient with a history of breast cancer even though her was triple negative breast cancer.    # vaginal atrophy- ? Recommend estradiol ointment after discussing pros and cons.  # DISPOSITION: Discussed follow-up; patient feels comfortable following up with PCP/Dr. Bary Castilla.  No special labs/imaging needed for surveillance.  Patient will continue to follow-up with Dr. Rolene Course mammograms physical exams and regular basis.

## 2018-08-13 NOTE — Progress Notes (Signed)
Here for follow up. Stated overall feeling "good " with CC dx. Stated struggling with ADD dx

## 2018-08-13 NOTE — Progress Notes (Signed)
[facility] OFFICE PROGRESS NOTE  Patient Care Team: Ria Bush, MD as PCP - General (Family Medicine) Bary Castilla, Forest Gleason, MD (General Surgery) Leia Alf, MD (Inactive) as Attending Physician (Internal Medicine)  Cancer Staging No matching staging information was found for the patient.   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   Primary tumor size 1.3 cm, grade 3.  Margins negative.  One sentinel lymph node negative for malignancy.   ER and PR negative.  HER-2/neu negative (1+ on IHC).  Patient received adjuvant chemotherapy (on clinical trial ECOG 5103, AC followed by paclitaxel plus Avastin/placebo).  Last dose of Paclitaxel was 11/15/08.  Last dose of Avastin/placebo was 06/10/09.  2. 06/25/13 - Biopsy of developing left breast calcification seen on mammogram reported negative for atypia/malignancy.  # BRCA- NEG/ [place]- broader panel-NEG.   # ADD/Hypothyroidism [Dr.Magedelena [place]; [facility]]  DIAGNOSIS: LEFT BREAST CA  STAGE: I   ;GOALS: Cure  CURRENT/MOST RECENT THERAPY : Surveillance.      Carcinoma of upper-outer quadrant of left breast in female, estrogen receptor negative ([place])     INTERVAL HISTORY:  Susan Soto 52 y.o.  female pleasant patient above history of Stage I triple negative breast cancer is here for follow-up. Patient was in a clinical trial with adjuvant Avastin/placebo last received in August 2010.  Patient complains of mood swings/difficulty with concentration-states that ADHD is not well controlled.  As per the patient her psychiatrist is concerned about low estrogen state causing her symptoms.  She is also concerned about vaginal atrophy frequency of urination. Patient denies any lumps or bumps. Appetite is good. No chest pain or shortness of breath or  cough. No bone pain. No headaches.  Review of Systems  Constitutional: Negative for chills, diaphoresis, fever, malaise/fatigue and weight loss.  HENT: Negative for nosebleeds and sore throat.   Eyes: Negative for double vision.  Respiratory: Negative for cough, hemoptysis, sputum production, shortness of breath and wheezing.   Cardiovascular: Negative for chest pain, palpitations, orthopnea and leg swelling.  Gastrointestinal: Negative for abdominal pain, blood in stool, constipation, diarrhea, heartburn, melena, nausea and vomiting.  Genitourinary: Negative for dysuria, frequency and urgency.  Musculoskeletal: Negative for back pain and joint pain.  Skin: Negative.  Negative for itching and rash.  Neurological: Negative for dizziness, tingling, focal weakness, weakness and headaches.  Endo/Heme/Allergies: Does not bruise/bleed easily.  Psychiatric/Behavioral: Negative for depression. The patient is not nervous/anxious and does not have insomnia.      PAST MEDICAL HISTORY :  Past Medical History:  Diagnosis Date  . ADD (attention deficit disorder) 1990s  . Anxiety   . BRCA negative 05/20/2017   MyRisk neg  . 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   . Genetic testing of female 05/2017  . Heart murmur   . Hemorrhoids   . History of breast cancer 2009   Left side, lumpectomy, s/p sentinal LN biopsy, chemo and radiation, remission  . Malignant neoplasm of upper-outer quadrant of female breast ([place]) 05/27/2008   Left breast, 1.3 cm triple negative, node negative invasive mammary carcinoma. T1c, N0  . Personal history of chemotherapy 2009   left breast ca  . Personal history of radiation therapy 2009   left breast ca  . Subclinical hypothyroidism   .  Syncope and collapse     PAST SURGICAL HISTORY :   Past Surgical History:  Procedure Laterality Date  . bartholin's cystectomy    . BREAST BIOPSY Left 05/07/08   Left  (infitr ductal CA)- Dr. Bary Castilla  . BREAST BIOPSY Left 06/25/13   left (benign) atrophic ductal epithelium, microcalcifications, changes of previous surgery.  Marland Kitchen BREAST LUMPECTOMY Left 05/27/2008   invasive ductal carcinoma  . breast wide excision  05/27/08   Left-with sentinel node bx (Dr. Bary Castilla)  . COLONOSCOPY  07/2009   WNL ([place])  . CYSTOSCOPY    . episiotomy      FAMILY HISTORY :   Family History  Problem Relation Age of Onset  . Drug abuse Father        Heroin  . Hypertension Mother   . Fibromyalgia Mother   . ADD / ADHD Mother   . Depression Mother   . Cancer Mother        cervical  . Colon polyps Mother        Hyperplastic  . ADD / ADHD Brother   . Anxiety disorder Brother   . Stroke Maternal Grandmother   . Breast cancer Maternal Grandmother        50's  . Hypertension Paternal Grandmother   . Cancer Paternal Grandfather        lung  . Hypothyroidism Maternal Aunt   . Cancer Maternal Aunt        vulvar  . Hypothyroidism Maternal Uncle   . Diabetes Neg Hx   . Coronary artery disease Neg Hx     SOCIAL HISTORY:   Social History   Tobacco Use  . Smoking status: Never Smoker  . Smokeless tobacco: Never Used  Substance Use Topics  . Alcohol use: No  . Drug use: No    ALLERGIES:  is allergic to bupropion hcl; buspar [buspirone hcl]; other; strattera [atomoxetine hcl]; and tegaderm ag mesh [silver].  MEDICATIONS:  Current Outpatient Medications  Medication Sig Dispense Refill  . ARIPiprazole (ABILIFY) 2 MG tablet Take 1 mg by mouth daily.     . Cholecalciferol (VITAMIN D-3) 5000 UNITS TABS Take 2 tablets by mouth daily.     . cloNIDine (CATAPRES) 0.1 MG tablet TAKE 1 TABLET BY MOUTH TWICE A DAY FOR 7 DAYS THEN TAKE 1 TO 2 TABLETS BY MOUTH TWICE A DAY  2  . dextroamphetamine (DEXEDRINE SPANSULE) 15 MG 24 hr capsule Take 30 mg by mouth daily. Take 45-60 mg daily  0  . dextroamphetamine (DEXTROSTAT) 10 MG tablet Take 10 mg by mouth. Takes 5-6 tablets at 2 pm  0   . Docusate Calcium (STOOL SOFTENER PO) Take by mouth as needed.    Marland Kitchen levothyroxine (SYNTHROID, LEVOTHROID) 75 MCG tablet Take 75 mcg by mouth every morning.  5  . Probiotic Product (PROBIOTIC DAILY PO) Take by mouth daily.    . Pyridoxal-5-Phosphate POWD by Does not apply route daily.    Marland Kitchen pyridoxine (B-6) 100 MG tablet Take 100 mg by mouth daily.    . valACYclovir (VALTREX) 1000 MG tablet Take 2 tablets (2,000 mg total) by mouth 2 (two) times daily. x1 day (Patient taking differently: Take 2,000 mg by mouth as needed. x1 day) 4 tablet 3  . diazepam (VALIUM) 2 MG tablet Take 2 mg by mouth every 12 (twelve) hours as needed for anxiety (bladder retention).     Marland Kitchen estradiol (ESTRACE VAGINAL) 0.1 MG/GM vaginal cream Place 1 Applicatorful vaginally 3 (three) times a week.  42.5 g 12  . zinc gluconate 50 MG tablet Take 50 mg by mouth daily.     No current facility-administered medications for this visit.    Facility-Administered Medications Ordered in Other Visits  Medication Dose Route Frequency Provider Last Rate Last Dose  . Influenza vac split quadrivalent PF (FLUARIX) injection 0.5 mL  0.5 mL Intramuscular Once Leia Alf, MD        PHYSICAL EXAMINATION: ECOG PERFORMANCE STATUS: 0 - Asymptomatic  BP 129/79 (BP Location: Right Arm, Patient Position: Sitting)   Pulse 76   Temp (!) 96.2 F (35.7 C) (Tympanic)   Resp 18   Wt 175 lb (79.4 kg)   LMP 03/04/2014   BMI 28.68 kg/m   Filed Weights   08/13/18 1141  Weight: 175 lb (79.4 kg)    Physical Exam  Constitutional: She is oriented to person, place, and time and well-developed, well-nourished, and in no distress.  HENT:  Head: Normocephalic and atraumatic.  Mouth/Throat: Oropharynx is clear and moist. No oropharyngeal exudate.  Eyes: Pupils are equal, round, and reactive to light.  Neck: Normal range of motion. Neck supple.  Cardiovascular: Normal rate and regular rhythm.  Pulmonary/Chest: No respiratory distress. She has no  wheezes.  Abdominal: Soft. Bowel sounds are normal. She exhibits no distension and no mass. There is no tenderness. There is no rebound and no guarding.  Musculoskeletal: Normal range of motion. She exhibits no edema or tenderness.  Neurological: She is alert and oriented to person, place, and time.  Skin: Skin is warm.  Psychiatric: Affect normal.     LABORATORY DATA:  I have reviewed the data as listed    Component Value Date/Time   NA 138 08/13/2018 1116   NA 143 04/23/2017 1632   K 3.6 08/13/2018 1116   CL 105 08/13/2018 1116   CO2 29 08/13/2018 1116   GLUCOSE 108 (H) 08/13/2018 1116   BUN 12 08/13/2018 1116   BUN 12 04/23/2017 1632   CREATININE 0.59 08/13/2018 1116   CREATININE 0.61 07/22/2015   CALCIUM 9.5 08/13/2018 1116   PROT 6.8 08/13/2018 1116   PROT 6.9 07/22/2015   PROT 7.0 07/23/2014 1024   ALBUMIN 4.1 08/13/2018 1116   ALBUMIN 4.1 07/23/2014 1024   AST 16 08/13/2018 1116   AST 13 07/22/2015   ALT 16 08/13/2018 1116   ALT 15 07/22/2015   ALKPHOS 45 08/13/2018 1116   ALKPHOS 45 07/22/2015   BILITOT 0.6 08/13/2018 1116   BILITOT 0.4 07/22/2015   GFRNONAA >60 08/13/2018 1116   GFRNONAA >60 07/23/2014 1024   GFRNONAA >60 07/22/2013 0949   GFRAA >60 08/13/2018 1116   GFRAA >60 07/23/2014 1024   GFRAA >60 07/22/2013 0949    No results found for: SPEP, UPEP  Lab Results  Component Value Date   WBC 6.2 08/13/2018   NEUTROABS 3.0 08/13/2018   HGB 12.6 08/13/2018   HCT 38.2 08/13/2018   MCV 89.0 08/13/2018   PLT 248 08/13/2018      Chemistry      Component Value Date/Time   NA 138 08/13/2018 1116   NA 143 04/23/2017 1632   K 3.6 08/13/2018 1116   CL 105 08/13/2018 1116   CO2 29 08/13/2018 1116   BUN 12 08/13/2018 1116   BUN 12 04/23/2017 1632   CREATININE 0.59 08/13/2018 1116   CREATININE 0.61 07/22/2015      Component Value Date/Time   CALCIUM 9.5 08/13/2018 1116   ALKPHOS 45 08/13/2018 1116   ALKPHOS  45 07/22/2015   AST 16 08/13/2018  1116   AST 13 07/22/2015   ALT 16 08/13/2018 1116   ALT 15 07/22/2015   BILITOT 0.6 08/13/2018 1116   BILITOT 0.4 07/22/2015       RADIOGRAPHIC STUDIES: I have personally reviewed the radiological images as listed and agreed with the findings in the report. No results found.   ASSESSMENT & PLAN:  Carcinoma of upper-outer quadrant of left breast in female, estrogen receptor negative ([place]) # Triple negative breast cancer- left Stage I;  lumpectomy status post adjuvant chemotherapy in 2009. STABLE.   # Clinically no evidence of recurrence. Mammogram within normal limits.   # ADD/mood swings- worsened with low estrogen state;? Hypothyroidism-versus others.  Discussed that typically would not recommend estrogen/systemic patient with a history of breast cancer even though her was triple negative breast cancer.    # vaginal atrophy- ? Recommend estradiol ointment after discussing pros and cons.  # DISPOSITION: Discussed follow-up; patient feels comfortable following up with PCP/Dr. Bary Castilla.  No special labs/imaging needed for surveillance.  Patient will continue to follow-up with Dr. Rolene Course mammograms physical exams and regular basis.   No orders of the defined types were placed in this encounter.  All questions were answered. The patient knows to call the clinic with any problems, questions or concerns.      Susan Sickle, MD 08/13/2018 2:51 PM

## 2018-08-14 ENCOUNTER — Telehealth: Payer: Self-pay

## 2018-08-14 DIAGNOSIS — C50412 Malignant neoplasm of upper-outer quadrant of left female breast: Secondary | ICD-10-CM

## 2018-08-14 DIAGNOSIS — Z171 Estrogen receptor negative status [ER-]: Principal | ICD-10-CM

## 2018-08-14 LAB — CA 27.29 (SERIAL MONITOR): CAN 27.29: 18 U/mL (ref 0.0–38.6)

## 2018-08-14 NOTE — Telephone Encounter (Signed)
Jeanella Craze MR [id number] who is a patient of Dr. Tasia Catchings is the aunt of patient Leshay Desaulniers MR [id number] who is a patient of Dr. Rogue Bussing. (Both were in clinic yesterday). Nakita has been participating in a lab study with research and reached out to [place] yesterday informing that her aunt Jeanella Craze) called her after her appt with Dr. Tasia Catchings and shared that she had been informed she was BRCA 2 positive. Oneka explained there is a great number of her relatives (especially female) who current have or have had cancer. Although Mollee had genetic testing in the past through [facility], she states that she has discovered there are new tests available such as BART and CHECK 2. I saw in [place] note, she is going to be referred to [facility] for genetic counseling. Roxann mentioned Dr. Rogue Bussing released her from cancer follow up as she is 10 years out since diagnosis.   After speaking with Dr. Rogue Bussing about this issue, he states it would be very reasonable to refer patient to Ferol Luz, genetic counselor, for some additional testing. I attempted to contact patient, but I was unable to reach her. I left a message for her to return phone call regarding this information.   Referral to Ria Comment has been placed.

## 2018-08-18 ENCOUNTER — Telehealth: Payer: Self-pay | Admitting: *Deleted

## 2018-08-18 DIAGNOSIS — R5382 Chronic fatigue, unspecified: Secondary | ICD-10-CM

## 2018-08-18 DIAGNOSIS — Z13220 Encounter for screening for lipoid disorders: Secondary | ICD-10-CM

## 2018-08-18 DIAGNOSIS — E039 Hypothyroidism, unspecified: Secondary | ICD-10-CM

## 2018-08-18 DIAGNOSIS — E559 Vitamin D deficiency, unspecified: Secondary | ICD-10-CM

## 2018-08-18 DIAGNOSIS — E038 Other specified hypothyroidism: Secondary | ICD-10-CM

## 2018-08-18 NOTE — Telephone Encounter (Signed)
Copied from [place] [id number]. Topic: General - Other >> Aug 18, 2018  2:28 PM Yvette Rack wrote: Reason for CRM: pt is requesting lab orders for her physical 09-16-18 with Dr Darnell Level pt like to [facility] [place] a [facility]

## 2018-08-20 ENCOUNTER — Other Ambulatory Visit: Payer: Self-pay | Admitting: Family Medicine

## 2018-08-20 DIAGNOSIS — E038 Other specified hypothyroidism: Secondary | ICD-10-CM

## 2018-08-20 DIAGNOSIS — Z13220 Encounter for screening for lipoid disorders: Secondary | ICD-10-CM

## 2018-08-20 DIAGNOSIS — E559 Vitamin D deficiency, unspecified: Secondary | ICD-10-CM

## 2018-08-20 DIAGNOSIS — E039 Hypothyroidism, unspecified: Secondary | ICD-10-CM

## 2018-08-20 NOTE — Telephone Encounter (Signed)
plz notify labs ordered.  

## 2018-08-20 NOTE — Addendum Note (Signed)
Addended by: Ria Bush on: 08/20/2018 09:49 AM   Modules accepted: Orders

## 2018-08-20 NOTE — Telephone Encounter (Signed)
Susan Soto notified by telephone that lab order have been entered and she just needs to go to [facility] to get them drawn prior to her physical.

## 2018-08-25 ENCOUNTER — Encounter: Payer: Self-pay | Admitting: Internal Medicine

## 2018-08-25 ENCOUNTER — Telehealth: Payer: Self-pay | Admitting: Genetics

## 2018-08-25 NOTE — Telephone Encounter (Addendum)
Touched base with patient regarding questions about her genetic testing.  Her aunt is BRCA2 positive.  She had testing in 2009 for this, and had updated genetic testing in 2018 for a MyRisk Panel.  Susan Soto shared her family history of breast cancer in aunt Best boy) and grandmother (BRCA status unk).  She says her mother had colon polyps, but less than 21, cousin with colon cancer, fhx of bladder and lung cancer (in relatives with hx of smoking cancer) fhx of vulvar cancer, ovarian,  and skin cancer (not melanoma).   Susan Soto was wondering if there are new genetic tests that would be appropriate for her.  I told her that her panel in 2018 covered breast, GYN, and GI cancer (colon, stomach, panc, etc), but did not look at other genes causative of other types of cancer (ex: kidney cancer risk mutation, etc)  Her family history does not seem highly suspicious for any of these other cancer predisposition syndromes, however, we could analyze these additional genes if she felt strongly about it.  I also informed her there is some very new technology that is analyzing genes in a new way (RNA analysis)- this helps clarify a result maybe 1-2% of the time, but is additional testing that could be ordered if she wanted.   She specifically asked about CHEK2 and BART testing- both of which her 2018 [facility] panel analyzed.   Overall, there are some small incremental changes (a couple genes added-mostly ref to colon polyposis, or RNA analysis) that can be performed on her now that was not done in 2018, however, the change of fining a mutation is likely small.  Her grandmother is currently awaiting her genetic test results- I told her if she comes back positive for a mutation to contact me and we can ensure she was tested for the mutation.  After our discussion, Susan Soto decided not to proceed with her genetic counseling appointment at this time.  I encouraged her to keep contacting our department and we are happy to  keep updating her on new testing, and to let us know if major changes occur for her/her family's cancer history.  Genetics is a rapidly changing field, while advancements in the past year have been incremental, a couple/few years from now it may be very much appropriate to do updated testing, so it is great for her to keep in contact.   We did recommend she continue to follow her health care providers' recommendations regarding cancer management/screening (mammogram, colonoscopy, PAP, etc).  We recommended that she and her family members all tell their doctors about the fhx of cancer.  We recommended her daughters/sisters/first degree relatives start mammograms at 33 (10 years prior to her dx at 19).   We will send a copy of her report we found in epic so she can see a list of which genes she was tested for.

## 2018-09-09 LAB — T4, FREE: Free T4: 1.49 ng/dL (ref 0.82–1.77)

## 2018-09-09 LAB — VITAMIN D 25 HYDROXY (VIT D DEFICIENCY, FRACTURES): Vit D, 25-Hydroxy: 56.5 ng/mL (ref 30.0–100.0)

## 2018-09-09 LAB — LIPID PANEL
CHOLESTEROL TOTAL: 141 mg/dL (ref 100–199)
Chol/HDL Ratio: 2.5 ratio (ref 0.0–4.4)
HDL: 57 mg/dL (ref >39)
LDL CALC: 74 mg/dL (ref 0–99)
Triglycerides: 49 mg/dL (ref 0–149)
VLDL CHOLESTEROL CAL: 10 mg/dL (ref 5–40)

## 2018-09-09 LAB — TSH: TSH: 2.02 u[IU]/mL (ref 0.450–4.500)

## 2018-09-09 LAB — T3: T3, Total: 115 ng/dL (ref 71–180)

## 2018-09-15 NOTE — Progress Notes (Signed)
BP 114/64 (BP Location: Right Arm, Patient Position: Sitting, Cuff Size: Normal)   Pulse 71   Temp 98 F (36.7 C) (Oral)   Ht 5' 5.5" (1.664 m)   Wt 175 lb 4 oz (79.5 kg)   LMP 03/04/2014   SpO2 97%   BMI 28.72 kg/m    CC: CPE Subjective:    Patient ID: Susan Soto, female    DOB: November 26, 1965, 52 y.o.   MRN: [id number]  HPI: Susan Soto is a 52 y.o. female presenting on 09/16/2018 for Annual Exam   Hypothyroidism followed by psych on levothyroxine 50mcg daily.  H/o breast cancer - followed by Dr Rogue Bussing - released 07/2018. Continues seeing Dr Bary Castilla yearly. She had normal genetic testing last year.   Recently started on clonidine by psych. Severe ADD, worse after menopause. Abilify was stopped.   Preventative: Colon cancer screening - colonoscopy 2010 WNL (Oh) Well woman with OBGYN - mother dx with cervical cancer 83s. pap smear normal 06/2017. Recent benign endometrial biopsy for post menopausal bleeding.  Mammogram normal 06/2018. History of L breast cancer s/p radiation and chemotherapy - yearly mammograms, sees surgery yearly.  LMP - 02/2014. Postmenopausal Flu yearly Td 2003, Tdap 2013 Seat belt use discussed. Sunscreen use discussed. No changing moles.  Non smoker. Significant 2nd hand smoke exposure.  Alcohol - none.  Dentist q6 mo Eye exam - due  Caffeine: 1 cup coffee in am Lives with husband and 1 son, daughter in college, 1 dog and 1 cat, 5-6 outside cats Occupation: Camera operator job Activity: no regular activity Diet: joined [employer], good water, fruits/vegetables daily, red meat 3x/wk, fish 2x/wk, more stevia and less regular sugar. Switched from iodinated salt to sea salt.   Relevant past medical, surgical, family and social history reviewed and updated as indicated. Interim medical history since our last visit reviewed. Allergies and medications reviewed and updated. Outpatient Medications Prior to Visit  Medication Sig Dispense  Refill  . Cholecalciferol (VITAMIN D-3) 5000 UNITS TABS Take 2 tablets by mouth daily.     . cloNIDine (CATAPRES) 0.1 MG tablet TAKE 1 TABLET BY MOUTH TWICE A DAY FOR 7 DAYS THEN TAKE 1 TO 2 TABLETS BY MOUTH TWICE A DAY  2  . dextroamphetamine (DEXEDRINE SPANSULE) 15 MG 24 hr capsule Take 30 mg by mouth daily. Take 45-60 mg daily  0  . dextroamphetamine (DEXTROSTAT) 10 MG tablet Take 10 mg by mouth. Takes 5-6 tablets at 2 pm  0  . diazepam (VALIUM) 2 MG tablet Take 2 mg by mouth every 12 (twelve) hours as needed for anxiety (bladder retention).     Mariane Baumgarten Calcium (STOOL SOFTENER PO) Take by mouth as needed.    Marland Kitchen estradiol (ESTRACE VAGINAL) 0.1 MG/GM vaginal cream Place 1 Applicatorful vaginally 3 (three) times a week. 42.5 g 12  . levothyroxine (SYNTHROID, LEVOTHROID) 75 MCG tablet Take 75 mcg by mouth every morning.  5  . Probiotic Product (PROBIOTIC DAILY PO) Take by mouth daily.    Marland Kitchen pyridoxine (B-6) 100 MG tablet Take 100 mg by mouth daily.    Marland Kitchen zinc gluconate 50 MG tablet Take 50 mg by mouth daily.    . Cyanocobalamin (VITAMIN B-12 PO) Take 1 tablet by mouth daily.    . valACYclovir (VALTREX) 1000 MG tablet Take 2 tablets (2,000 mg total) by mouth 2 (two) times daily. x1 day (Patient taking differently: Take 2,000 mg by mouth as needed. x1 day) 4 tablet 3  .  ARIPiprazole (ABILIFY) 2 MG tablet Take 1 mg by mouth daily.     . Pyridoxal-5-Phosphate POWD by Does not apply route daily.     Facility-Administered Medications Prior to Visit  Medication Dose Route Frequency Provider Last Rate Last Dose  . Influenza vac split quadrivalent PF (FLUARIX) injection 0.5 mL  0.5 mL Intramuscular Once Leia Alf, MD         Per HPI unless specifically indicated in ROS section below Review of Systems  Constitutional: Negative for activity change, appetite change, chills, fatigue, fever and unexpected weight change.  HENT: Negative for hearing loss.   Eyes: Negative for visual disturbance.    Respiratory: Negative for cough, chest tightness, shortness of breath and wheezing.   Cardiovascular: Negative for chest pain, palpitations and leg swelling.  Gastrointestinal: Positive for constipation (med related). Negative for abdominal distention, abdominal pain, blood in stool, diarrhea, nausea and vomiting.  Genitourinary: Negative for difficulty urinating and hematuria.  Musculoskeletal: Negative for arthralgias, myalgias and neck pain.  Skin: Negative for rash.  Neurological: Negative for dizziness, seizures, syncope and headaches.  Hematological: Negative for adenopathy. Bruises/bleeds easily (longstanding).  Psychiatric/Behavioral: Negative for dysphoric mood. The patient is not nervous/anxious.        Objective:    BP 114/64 (BP Location: Right Arm, Patient Position: Sitting, Cuff Size: Normal)   Pulse 71   Temp 98 F (36.7 C) (Oral)   Ht 5' 5.5" (1.664 m)   Wt 175 lb 4 oz (79.5 kg)   LMP 03/04/2014   SpO2 97%   BMI 28.72 kg/m   Wt Readings from Last 3 Encounters:  09/16/18 175 lb 4 oz (79.5 kg)  08/13/18 175 lb (79.4 kg)  07/24/18 173 lb (78.5 kg)    Physical Exam  Constitutional: She is oriented to person, place, and time. She appears well-developed and well-nourished. No distress.  HENT:  Head: Normocephalic and atraumatic.  Right Ear: Hearing, tympanic membrane, external ear and ear canal normal.  Left Ear: Hearing, tympanic membrane, external ear and ear canal normal.  Nose: Nose normal.  Mouth/Throat: Uvula is midline, oropharynx is clear and moist and mucous membranes are normal. No oropharyngeal exudate, posterior oropharyngeal edema or posterior oropharyngeal erythema.  Eyes: Pupils are equal, round, and reactive to light. Conjunctivae and EOM are normal. No scleral icterus.  Neck: Normal range of motion. Neck supple. Thyromegaly (L sided) present.  Cardiovascular: Normal rate, regular rhythm, normal heart sounds and intact distal pulses.  No murmur  heard. Pulses:      Radial pulses are 2+ on the right side, and 2+ on the left side.  Pulmonary/Chest: Effort normal and breath sounds normal. No respiratory distress. She has no wheezes. She has no rales.  Abdominal: Soft. Bowel sounds are normal. She exhibits no distension and no mass. There is no tenderness. There is no rebound and no guarding.  Genitourinary: Vagina normal and uterus normal. Pelvic exam was performed with patient supine. There is no rash, tenderness, lesion or injury on the right labia. There is no rash, tenderness, lesion or injury on the left labia. Cervix exhibits no motion tenderness, no discharge and no friability. Right adnexum displays no mass, no tenderness and no fullness. Left adnexum displays no mass, no tenderness and no fullness.  Genitourinary Comments: Pap performed on cervix  Musculoskeletal: Normal range of motion. She exhibits no edema.  Lymphadenopathy:    She has no cervical adenopathy.  Neurological: She is alert and oriented to person, place, and time.  CN  grossly intact, station and gait intact  Skin: Skin is warm and dry. No rash noted.  Psychiatric: She has a normal mood and affect. Her behavior is normal. Judgment and thought content normal.  Nursing note and vitals reviewed.  Results for orders placed or performed in visit on 09/16/18  POCT glycosylated hemoglobin (Hb A1C)  Result Value Ref Range   Hemoglobin A1C 5.4 4.0 - 5.6 %   HbA1c POC (<> result, manual entry)     HbA1c, POC (prediabetic range)     HbA1c, POC (controlled diabetic range)        Assessment & Plan:   Problem List Items Addressed This Visit    Vitamin D deficiency    Levels stable.      Subclinical hypothyroidism    This is largely followed by psych. TFTs stable.       Postmenopausal bleeding    No further bleeding episodes.      Postmenopausal   Personal history of breast cancer   Healthcare maintenance - Primary    Preventative protocols reviewed and  updated unless pt declined. Discussed healthy diet and lifestyle.       GAD (generalized anxiety disorder)    Followed by psych - improvement with addition of clonidine.      Attention deficit hyperactivity disorder (ADHD), predominantly inattentive type    Severe, managed by psych       Other Visit Diagnoses    Hyperglycemia       Relevant Orders   POCT glycosylated hemoglobin (Hb A1C) (Completed)   Thyroid enlargement       Relevant Orders   US THYROID   Encounter for annual routine gynecological examination       Relevant Orders   Cytology - PAP   Abnormal kidney function           Meds ordered this encounter  Medications  . valACYclovir (VALTREX) 1000 MG tablet    Sig: Take 2 tablets (2,000 mg total) by mouth 2 (two) times daily. x1 day    Dispense:  20 tablet    Refill:  1  . Cyanocobalamin (B-12) 1000 MCG SUBL    Sig: Place 1 tablet under the tongue daily.    Dispense:  30 each   Orders Placed This Encounter  Procedures  . US THYROID    Standing Status:   Future    Standing Expiration Date:   11/17/2019    Order Specific Question:   Reason for Exam (SYMPTOM  OR DIAGNOSIS REQUIRED)    Answer:   L thyroid enlargement    Order Specific Question:   Preferred imaging location?    Answer:   [facility]  . POCT glycosylated hemoglobin (Hb A1C)    Follow up plan: Return in about 1 year (around 09/17/2019) for annual exam, prior fasting for blood work.  Ria Bush, MD

## 2018-09-15 NOTE — Assessment & Plan Note (Signed)
Preventative protocols reviewed and updated unless pt declined. Discussed healthy diet and lifestyle.  

## 2018-09-16 ENCOUNTER — Ambulatory Visit (INDEPENDENT_AMBULATORY_CARE_PROVIDER_SITE_OTHER): Payer: Managed Care, Other (non HMO) | Admitting: Family Medicine

## 2018-09-16 ENCOUNTER — Other Ambulatory Visit: Payer: Self-pay | Admitting: Family Medicine

## 2018-09-16 ENCOUNTER — Telehealth: Payer: Self-pay | Admitting: Family Medicine

## 2018-09-16 ENCOUNTER — Encounter: Payer: Self-pay | Admitting: Family Medicine

## 2018-09-16 ENCOUNTER — Other Ambulatory Visit (HOSPITAL_COMMUNITY)
Admission: RE | Admit: 2018-09-16 | Discharge: 2018-09-16 | Disposition: A | Discharge status: Home / Self Care | Payer: Managed Care, Other (non HMO) | Source: Ambulatory Visit | Attending: Family Medicine | Admitting: Family Medicine

## 2018-09-16 VITALS — BP 114/64 | HR 71 | Temp 98.0°F | Ht 65.5 in | Wt 175.2 lb

## 2018-09-16 DIAGNOSIS — Z853 Personal history of malignant neoplasm of breast: Secondary | ICD-10-CM

## 2018-09-16 DIAGNOSIS — F900 Attention-deficit hyperactivity disorder, predominantly inattentive type: Secondary | ICD-10-CM | POA: Diagnosis not present

## 2018-09-16 DIAGNOSIS — Z0000 Encounter for general adult medical examination without abnormal findings: Secondary | ICD-10-CM

## 2018-09-16 DIAGNOSIS — E039 Hypothyroidism, unspecified: Secondary | ICD-10-CM

## 2018-09-16 DIAGNOSIS — R739 Hyperglycemia, unspecified: Secondary | ICD-10-CM

## 2018-09-16 DIAGNOSIS — F411 Generalized anxiety disorder: Secondary | ICD-10-CM

## 2018-09-16 DIAGNOSIS — E559 Vitamin D deficiency, unspecified: Secondary | ICD-10-CM

## 2018-09-16 DIAGNOSIS — N289 Disorder of kidney and ureter, unspecified: Secondary | ICD-10-CM

## 2018-09-16 DIAGNOSIS — E049 Nontoxic goiter, unspecified: Secondary | ICD-10-CM

## 2018-09-16 DIAGNOSIS — Z01419 Encounter for gynecological examination (general) (routine) without abnormal findings: Secondary | ICD-10-CM | POA: Diagnosis not present

## 2018-09-16 DIAGNOSIS — E038 Other specified hypothyroidism: Secondary | ICD-10-CM

## 2018-09-16 DIAGNOSIS — Z780 Asymptomatic menopausal state: Secondary | ICD-10-CM

## 2018-09-16 DIAGNOSIS — N950 Postmenopausal bleeding: Secondary | ICD-10-CM

## 2018-09-16 LAB — POCT GLYCOSYLATED HEMOGLOBIN (HGB A1C): HEMOGLOBIN A1C: 5.4 % (ref 4.0–5.6)

## 2018-09-16 MED ORDER — VALACYCLOVIR HCL 1 G PO TABS: 2000.0000 mg | ORAL_TABLET | Freq: Two times a day (BID) | ORAL | 1 refills | Status: DC

## 2018-09-16 MED ORDER — B-12 1000 MCG SL SUBL: 1.0000 | SUBLINGUAL_TABLET | Freq: Every day | SUBLINGUAL | Status: DC

## 2018-09-16 NOTE — Assessment & Plan Note (Signed)
No further bleeding episodes.

## 2018-09-16 NOTE — Assessment & Plan Note (Signed)
This is largely followed by psych. TFTs stable.

## 2018-09-16 NOTE — Assessment & Plan Note (Signed)
Levels stable.  

## 2018-09-16 NOTE — Patient Instructions (Addendum)
Schedule eye exam at your convenience. We will order thyroid ultrasound.  A1c today. Pap smear today. Good to see you, return as needed or in 1 year for next physical.   Health Maintenance, Female Adopting a healthy lifestyle and getting preventive care can go a long way to promote health and wellness. Talk with your health care provider about what schedule of regular examinations is right for you. This is a good chance for you to check in with your provider about disease prevention and staying healthy. In between checkups, there are plenty of things you can do on your own. Experts have done a lot of research about which lifestyle changes and preventive measures are most likely to keep you healthy. Ask your health care provider for more information. Weight and diet Eat a healthy diet  Be sure to include plenty of vegetables, fruits, low-fat dairy products, and lean protein.  Do not eat a lot of foods high in solid fats, added sugars, or salt.  Get regular exercise. This is one of the most important things you can do for your health. ? Most adults should exercise for at least 150 minutes each week. The exercise should increase your heart rate and make you sweat (moderate-intensity exercise). ? Most adults should also do strengthening exercises at least twice a week. This is in addition to the moderate-intensity exercise.  Maintain a healthy weight  Body mass index (BMI) is a measurement that can be used to identify possible weight problems. It estimates body fat based on height and weight. Your health care provider can help determine your BMI and help you achieve or maintain a healthy weight.  For females 66 years of age and older: ? A BMI below 18.5 is considered underweight. ? A BMI of 18.5 to 24.9 is normal. ? A BMI of 25 to 29.9 is considered overweight. ? A BMI of 30 and above is considered obese.  Watch levels of cholesterol and blood lipids  You should start having your blood  tested for lipids and cholesterol at 53 years of age, then have this test every 5 years.  You may need to have your cholesterol levels checked more often if: ? Your lipid or cholesterol levels are high. ? You are older than 53 years of age. ? You are at high risk for heart disease.  Cancer screening Lung Cancer  Lung cancer screening is recommended for adults 56-52 years old who are at high risk for lung cancer because of a history of smoking.  A yearly low-dose CT scan of the lungs is recommended for people who: ? Currently smoke. ? Have quit within the past 15 years. ? Have at least a 30-pack-year history of smoking. A pack year is smoking an average of one pack of cigarettes a day for 1 year.  Yearly screening should continue until it has been 15 years since you quit.  Yearly screening should stop if you develop a health problem that would prevent you from having lung cancer treatment.  Breast Cancer  Practice breast self-awareness. This means understanding how your breasts normally appear and feel.  It also means doing regular breast self-exams. Let your health care provider know about any changes, no matter how small.  If you are in your 20s or 30s, you should have a clinical breast exam (CBE) by a health care provider every 1-3 years as part of a regular health exam.  If you are 29 or older, have a CBE every year. Also consider  having a breast X-ray (mammogram) every year.  If you have a family history of breast cancer, talk to your health care provider about genetic screening.  If you are at high risk for breast cancer, talk to your health care provider about having an MRI and a mammogram every year.  Breast cancer gene (BRCA) assessment is recommended for women who have family members with BRCA-related cancers. BRCA-related cancers include: ? Breast. ? Ovarian. ? Tubal. ? Peritoneal cancers.  Results of the assessment will determine the need for genetic counseling and  BRCA1 and BRCA2 testing.  Cervical Cancer Your health care provider may recommend that you be screened regularly for cancer of the pelvic organs (ovaries, uterus, and vagina). This screening involves a pelvic examination, including checking for microscopic changes to the surface of your cervix (Pap test). You may be encouraged to have this screening done every 3 years, beginning at age 33.  For women ages 82-65, health care providers may recommend pelvic exams and Pap testing every 3 years, or they may recommend the Pap and pelvic exam, combined with testing for human papilloma virus (HPV), every 5 years. Some types of HPV increase your risk of cervical cancer. Testing for HPV may also be done on women of any age with unclear Pap test results.  Other health care providers may not recommend any screening for nonpregnant women who are considered low risk for pelvic cancer and who do not have symptoms. Ask your health care provider if a screening pelvic exam is right for you.  If you have had past treatment for cervical cancer or a condition that could lead to cancer, you need Pap tests and screening for cancer for at least 20 years after your treatment. If Pap tests have been discontinued, your risk factors (such as having a new sexual partner) need to be reassessed to determine if screening should resume. Some women have medical problems that increase the chance of getting cervical cancer. In these cases, your health care provider may recommend more frequent screening and Pap tests.  Colorectal Cancer  This type of cancer can be detected and often prevented.  Routine colorectal cancer screening usually begins at 53 years of age and continues through 53 years of age.  Your health care provider may recommend screening at an earlier age if you have risk factors for colon cancer.  Your health care provider may also recommend using home test kits to check for hidden blood in the stool.  A small camera  at the end of a tube can be used to examine your colon directly (sigmoidoscopy or colonoscopy). This is done to check for the earliest forms of colorectal cancer.  Routine screening usually begins at age 61.  Direct examination of the colon should be repeated every 5-10 years through 53 years of age. However, you may need to be screened more often if early forms of precancerous polyps or small growths are found.  Skin Cancer  Check your skin from head to toe regularly.  Tell your health care provider about any new moles or changes in moles, especially if there is a change in a mole's shape or color.  Also tell your health care provider if you have a mole that is larger than the size of a pencil eraser.  Always use sunscreen. Apply sunscreen liberally and repeatedly throughout the day.  Protect yourself by wearing long sleeves, pants, a wide-brimmed hat, and sunglasses whenever you are outside.  Heart disease, diabetes, and high blood pressure  High blood pressure causes heart disease and increases the risk of stroke. High blood pressure is more likely to develop in: ? People who have blood pressure in the high end of the normal range (130-139/85-89 mm Hg). ? People who are overweight or obese. ? People who are African American.  If you are 32-81 years of age, have your blood pressure checked every 3-5 years. If you are 37 years of age or older, have your blood pressure checked every year. You should have your blood pressure measured twice-once when you are at a hospital or clinic, and once when you are not at a hospital or clinic. Record the average of the two measurements. To check your blood pressure when you are not at a hospital or clinic, you can use: ? An automated blood pressure machine at a pharmacy. ? A home blood pressure monitor.  If you are between 27 years and 26 years old, ask your health care provider if you should take aspirin to prevent strokes.  Have regular diabetes  screenings. This involves taking a blood sample to check your fasting blood sugar level. ? If you are at a normal weight and have a low risk for diabetes, have this test once every three years after 53 years of age. ? If you are overweight and have a high risk for diabetes, consider being tested at a younger age or more often. Preventing infection Hepatitis B  If you have a higher risk for hepatitis B, you should be screened for this virus. You are considered at high risk for hepatitis B if: ? You were born in a country where hepatitis B is common. Ask your health care provider which countries are considered high risk. ? Your parents were born in a high-risk country, and you have not been immunized against hepatitis B (hepatitis B vaccine). ? You have HIV or AIDS. ? You use needles to inject street drugs. ? You live with someone who has hepatitis B. ? You have had sex with someone who has hepatitis B. ? You get hemodialysis treatment. ? You take certain medicines for conditions, including cancer, organ transplantation, and autoimmune conditions.  Hepatitis C  Blood testing is recommended for: ? Everyone born from [birth year] through 1966. ? Anyone with known risk factors for hepatitis C.  Sexually transmitted infections (STIs)  You should be screened for sexually transmitted infections (STIs) including gonorrhea and chlamydia if: ? You are sexually active and are younger than 53 years of age. ? You are older than 53 years of age and your health care provider tells you that you are at risk for this type of infection. ? Your sexual activity has changed since you were last screened and you are at an increased risk for chlamydia or gonorrhea. Ask your health care provider if you are at risk.  If you do not have HIV, but are at risk, it may be recommended that you take a prescription medicine daily to prevent HIV infection. This is called pre-exposure prophylaxis (PrEP). You are considered at risk  if: ? You are sexually active and do not regularly use condoms or know the HIV status of your partner(s). ? You take drugs by injection. ? You are sexually active with a partner who has HIV.  Talk with your health care provider about whether you are at high risk of being infected with HIV. If you choose to begin PrEP, you should first be tested for HIV. You should then be tested every 3 months  for as long as you are taking PrEP. Pregnancy  If you are premenopausal and you may become pregnant, ask your health care provider about preconception counseling.  If you may become pregnant, take 400 to 800 micrograms (mcg) of folic acid every day.  If you want to prevent pregnancy, talk to your health care provider about birth control (contraception). Osteoporosis and menopause  Osteoporosis is a disease in which the bones lose minerals and strength with aging. This can result in serious bone fractures. Your risk for osteoporosis can be identified using a bone density scan.  If you are 65 years of age or older, or if you are at risk for osteoporosis and fractures, ask your health care provider if you should be screened.  Ask your health care provider whether you should take a calcium or vitamin D supplement to lower your risk for osteoporosis.  Menopause may have certain physical symptoms and risks.  Hormone replacement therapy may reduce some of these symptoms and risks. Talk to your health care provider about whether hormone replacement therapy is right for you. Follow these instructions at home:  Schedule regular health, dental, and eye exams.  Stay current with your immunizations.  Do not use any tobacco products including cigarettes, chewing tobacco, or electronic cigarettes.  If you are pregnant, do not drink alcohol.  If you are breastfeeding, limit how much and how often you drink alcohol.  Limit alcohol intake to no more than 1 drink per day for nonpregnant women. One drink  equals 12 ounces of beer, 5 ounces of wine, or 1 ounces of hard liquor.  Do not use street drugs.  Do not share needles.  Ask your health care provider for help if you need support or information about quitting drugs.  Tell your health care provider if you often feel depressed.  Tell your health care provider if you have ever been abused or do not feel safe at home. This information is not intended to replace advice given to you by your health care provider. Make sure you discuss any questions you have with your health care provider. Document Released: 04/23/2011 Document Revised: 03/15/2016 Document Reviewed: 07/12/2015 [employer]vier Interactive Patient Education  2018 [employer]  

## 2018-09-16 NOTE — Assessment & Plan Note (Signed)
Followed by psych - improvement with addition of clonidine.

## 2018-09-16 NOTE — Assessment & Plan Note (Signed)
Severe, managed by psych

## 2018-09-16 NOTE — Telephone Encounter (Signed)
If I order them now, [facility] may draw them prematurely next time she has labs done there.  Please have her contact us closer to 09/2019 and I will order then.

## 2018-09-16 NOTE — Telephone Encounter (Signed)
Pt scheduled cpx 09/22/2019.  She gets her labs done @ [facility] and wanted to make sure orders are in

## 2018-09-17 ENCOUNTER — Telehealth: Payer: Self-pay

## 2018-09-17 ENCOUNTER — Encounter: Payer: Self-pay | Admitting: Family Medicine

## 2018-09-17 LAB — BASIC METABOLIC PANEL
BUN/Creatinine Ratio: 27 — ABNORMAL HIGH (ref 9–23)
BUN: 18 mg/dL (ref 6–24)
CO2: 25 mmol/L (ref 20–29)
CREATININE: 0.67 mg/dL (ref 0.57–1.00)
CREATININE: 0.7 (ref 0.5–1.1)
Calcium: 9.3 mg/dL (ref 8.7–10.2)
Chloride: 101 mmol/L (ref 96–106)
GFR, EST AFRICAN AMERICAN: 116 mL/min/{1.73_m2} (ref >59)
GFR, EST NON AFRICAN AMERICAN: 101 mL/min/{1.73_m2} (ref >59)
Glucose: 76
Glucose: 76 mg/dL (ref 65–99)
POTASSIUM: 3.9 (ref 3.4–5.3)
POTASSIUM: 3.9 mmol/L (ref 3.5–5.2)
SODIUM: 140 mmol/L (ref 134–144)
Sodium: 140 (ref 137–147)

## 2018-09-17 LAB — CYTOLOGY - PAP
Diagnosis: NEGATIVE
HPV (WINDOPATH): NOT DETECTED

## 2018-09-17 NOTE — Telephone Encounter (Signed)
Spoke to pt. She was aware that she needed to wait until closer to the visit next year.

## 2018-09-17 NOTE — Telephone Encounter (Signed)
Left message for patient to call back in regards to a referral   

## 2018-09-24 ENCOUNTER — Ambulatory Visit
Admission: RE | Admit: 2018-09-24 | Discharge: 2018-09-24 | Disposition: A | Discharge status: Home / Self Care | Payer: Managed Care, Other (non HMO) | Source: Ambulatory Visit | Attending: Family Medicine | Admitting: Family Medicine

## 2018-09-24 DIAGNOSIS — E041 Nontoxic single thyroid nodule: Secondary | ICD-10-CM | POA: Diagnosis not present

## 2018-09-24 DIAGNOSIS — E049 Nontoxic goiter, unspecified: Secondary | ICD-10-CM | POA: Diagnosis not present

## 2018-09-25 ENCOUNTER — Encounter: Payer: Self-pay | Admitting: Family Medicine

## 2018-09-25 DIAGNOSIS — E041 Nontoxic single thyroid nodule: Secondary | ICD-10-CM | POA: Insufficient documentation

## 2018-09-26 ENCOUNTER — Encounter: Payer: Self-pay | Admitting: Family Medicine

## 2018-10-09 ENCOUNTER — Encounter: Payer: Managed Care, Other (non HMO) | Admitting: Genetics

## 2018-10-30 ENCOUNTER — Telehealth: Payer: Self-pay | Admitting: Family Medicine

## 2018-10-30 DIAGNOSIS — E038 Other specified hypothyroidism: Secondary | ICD-10-CM

## 2018-10-30 DIAGNOSIS — E039 Hypothyroidism, unspecified: Secondary | ICD-10-CM

## 2018-10-30 NOTE — Telephone Encounter (Signed)
Pt want a referral to [facility]. Please advise.

## 2018-10-31 NOTE — Telephone Encounter (Addendum)
Spoke with pt informing her the referral was placed. Pt expresses her thanks and states, yes, the referral is for her thyroid. Fyi to Dr. Darnell Level.

## 2018-10-31 NOTE — Telephone Encounter (Signed)
Is this for her thyroid? Referral placed.

## 2018-11-10 ENCOUNTER — Ambulatory Visit (INDEPENDENT_AMBULATORY_CARE_PROVIDER_SITE_OTHER): Payer: Managed Care, Other (non HMO) | Admitting: Endocrinology

## 2018-11-10 ENCOUNTER — Encounter: Payer: Self-pay | Admitting: Endocrinology

## 2018-11-10 VITALS — BP 142/86 | HR 86 | Ht 65.5 in | Wt 176.6 lb

## 2018-11-10 DIAGNOSIS — R5383 Other fatigue: Secondary | ICD-10-CM | POA: Diagnosis not present

## 2018-11-10 DIAGNOSIS — R7301 Impaired fasting glucose: Secondary | ICD-10-CM | POA: Diagnosis not present

## 2018-11-10 DIAGNOSIS — Z131 Encounter for screening for diabetes mellitus: Secondary | ICD-10-CM | POA: Diagnosis not present

## 2018-11-10 DIAGNOSIS — E041 Nontoxic single thyroid nodule: Secondary | ICD-10-CM

## 2018-11-10 DIAGNOSIS — E039 Hypothyroidism, unspecified: Secondary | ICD-10-CM | POA: Diagnosis not present

## 2018-11-10 MED ORDER — LEVOTHYROXINE SODIUM 25 MCG PO TABS: 25.0000 ug | ORAL_TABLET | Freq: Every day | ORAL | 3 refills | Status: DC

## 2018-11-10 MED ORDER — LIOTHYRONINE SODIUM 5 MCG PO TABS: 5.0000 ug | ORAL_TABLET | Freq: Every day | ORAL | 1 refills | Status: DC

## 2018-11-10 NOTE — Progress Notes (Signed)
Patient ID: Susan Soto, female   DOB: Dec 15, 1965, 53 y.o.   MRN: [id number]            Referring Provider: Ria Bush,  Reason for Appointment:  Hypothyroidism, new visit    History of Present Illness:   Hypothyroidism was first diagnosed in 2017  At the time of diagnosis patient was having symptoms of   difficulty concentrating and what she describes as brain fog.  Also was having significant fatigue, irritability, some weight gain and anxiety. She also has had some problems with mild hair loss and her nails not growing She also has been followed for attention deficit disorder for some time  At that time she was being followed by a psychiatrist who recommended thyroid levels Since her TSH was mildly increased she was given a trial of 25 mcg of levothyroxine With this she thinks her mental focus, alertness and fatigue were improved She thinks that with this she was able to get back to work and function better  Apparently her psychiatrist wanted her TSH to be down to 1.5 Approximately 9/19 she was told to increase her dose to 50 mcg, thyroid labs are not available from that time She again thinks that her symptoms as above were better with the dosage increase In 11/19 since her TSH was about 2 and she was having fatigue and difficulty with function she was told to take 75 mcg of levothyroxine However with trying this for 2 to 3 weeks she was having symptoms of getting sweats, feeling hot, irritable, shakiness, anxiety and palpitations Her follow-up TSH was 0.9  Since then she has completely stopped her thyroid supplements and is requesting another opinion She is also concerned about her mildly increased antithyroglobulin antibody  Currently the patient is having symptoms of mild decrease in concentration and fatigue and also had some depression symptoms Her weight appears to be stable          Patient's weight history is as follows:  Wt Readings from Last 3 Encounters:    11/10/18 176 lb 9.6 oz (80.1 kg)  09/16/18 175 lb 4 oz (79.5 kg)  08/13/18 175 lb (79.4 kg)    Thyroid function results have been as follows:  Most recent labs showed TSH of 4.36, free T3 of 3.0 and free T4 of 0.99  Date   free T4   TSH   free T3   09/16/2018  1.37  0.9 ?  07/31/2018  1.34  3.40  3.24 (1.8-4.1)  04/12/2017  1.22  1.9      Lab Results  Component Value Date   TSH 2.020 09/08/2018   TSH 2.39 07/08/2017   TSH 3.830 07/09/2016   TSH 5.630 (H) 06/27/2016   FREET4 1.49 09/08/2018   FREET4 0.99 07/09/2016   FREET4 0.73 05/20/2012   FREET4 0.90 11/12/2011   T3FREE 3.2 05/20/2012   PROBLEM 2: Thyroid nodule  On her exam in 11/19 she was noticed to have a thyroid nodule in the front of her neck and was sent for an ultrasound by her PCP This revealed a 1.7 cm mixed cystic/solid and isoechoic nodule with smooth margins However the patient is still concerned about the size of the nodule and she thinks it is more prominent No difficulty swallowing  Past Medical History:  Diagnosis Date  . ADD (attention deficit disorder) 1990s  . Anxiety   . BRCA negative 05/20/2017   MyRisk neg  . 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   . Genetic testing of female 05/2017  . Heart murmur   . Hemorrhoids   . History of breast cancer 2009   Left side, lumpectomy, s/p sentinal LN biopsy, chemo and radiation, remission  . 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 T1c, N0  . Personal history of chemotherapy 2009   left breast ca  . Personal history of radiation therapy 2009   left breast ca  . Subclinical hypothyroidism   . Syncope and collapse     Past Surgical History:  Procedure Laterality Date  . bartholin's cystectomy    . BREAST BIOPSY Left 05/07/08   Left (infitr ductal CA)- Dr. Bary Castilla  .  BREAST BIOPSY Left 06/25/13   left (benign) atrophic ductal epithelium, microcalcifications, changes of previous surgery.  Marland Kitchen BREAST LUMPECTOMY Left 05/27/2008   invasive ductal carcinoma  . breast wide excision  05/27/08   Left-with sentinel node bx (Dr. Bary Castilla)  . COLONOSCOPY  07/2009   [place]NL ([place])  . CYSTOSCOPY    . episiotomy      Family History  Problem Relation Age of Onset  . Drug abuse Father        Heroin  . Hypertension Mother   . Fibromyalgia Mother   . ADD / ADHD Mother   . Depression Mother   . Cancer Mother        cervical, bladder, non melanoma skin  . Colon polyps Mother        Hyperplastic  . ADD / ADHD Brother   . Anxiety disorder Brother   . Stroke Maternal Grandmother   . Breast cancer Maternal Grandmother        50's  . Hypertension Paternal Grandmother   . Cancer Paternal Grandfather        lung  . Hypothyroidism Maternal Aunt   . Cancer Maternal Aunt        vulvar, cervical, non melanoma skin  . Hypothyroidism Maternal Uncle   . Cancer Maternal Aunt        cervical  . Diabetes Neg Hx   . Coronary artery disease Neg Hx     Social History:  reports that she has never smoked. She has never used smokeless tobacco. She reports that she does not drink alcohol or use drugs.  Allergies:  Allergies  Allergen Reactions  . Bupropion Hcl     REACTION: Hives  . Buspar [Buspirone Hcl] Nausea And Vomiting  . Other Other (See Comments)    Dissolving sutures Surgical glue bandaids   . Strattera [Atomoxetine Hcl] Other (See Comments)    Urinary trouble  . Tegaderm Ag Mesh [Silver]     Allergies as of 11/10/2018      Reactions   Bupropion Hcl    REACTION: Hives   Buspar [buspirone Hcl] Nausea And Vomiting   Other Other (See Comments)   Dissolving sutures Surgical glue bandaids   Strattera [atomoxetine Hcl] Other (See Comments)   Urinary trouble   Tegaderm Ag Mesh [silver]       Medication List       Accurate as of November 10, 2018  4:19 PM.  Always use your most recent med list.        B-12 1000 MCG Subl Place 1 tablet under the tongue daily.   cloNIDine 0.1 MG tablet Commonly known as:  CATAPRES TAKE 1 TABLET BY MOUTH TWICE A  DAY FOR 7 DAYS THEN TAKE 1 TO 2 TABLETS BY MOUTH TWICE A DAY   dextroamphetamine 10 MG tablet Commonly known as:  DEXTROSTAT Take 40 mg by mouth. TAKE 4 TABLETS (40MG) ONCE DAILY AT LUNCHTIME.   diazepam 2 MG tablet Commonly known as:  VALIUM Take 2 mg by mouth every 12 (twelve) hours as needed for anxiety (bladder retention).   estradiol 0.1 MG/GM vaginal cream Commonly known as:  ESTRACE VAGINAL Place 1 Applicatorful vaginally 3 (three) times a week.   levothyroxine 75 MCG tablet Commonly known as:  SYNTHROID, LEVOTHROID Take 75 mcg by mouth every morning.   PROBIOTIC DAILY PO Take by mouth daily.   pyridoxine 100 MG tablet Commonly known as:  B-6 Take 100 mg by mouth daily.   STOOL SOFTENER PO Take by mouth as needed.   valACYclovir 1000 MG tablet Commonly known as:  VALTREX Take 2 tablets (2,000 mg total) by mouth 2 (two) times daily. x1 day   Vitamin D-3 125 MCG (5000 UT) Tabs Take 2 tablets by mouth daily.   zinc gluconate 50 MG tablet Take 50 mg by mouth daily.         Review of Systems  Constitutional: Positive for malaise.       Over the last few years has gained about 30 pounds  HENT: Negative for trouble swallowing.   Eyes: Negative for blurred vision.  Respiratory: Negative for daytime sleepiness and shortness of breath.   Gastrointestinal: Negative for constipation.  Endocrine: Positive for fatigue, abnormal weight gain and decreased concentration. Negative for cold intolerance.  Musculoskeletal: Positive for joint pain.       She has mild intermittent finger joint pains  Skin:       Hair loss for months, nails not growing  Neurological: Negative for weakness and numbness.  Psychiatric/Behavioral: Positive for insomnia.   No history of hypertension but  she was told by her psychiatrist to take clonidine She thought she was having side effects from this and decided to stop this in November Has not followed up with PCP regarding this  BP Readings from Last 3 Encounters:  11/10/18 (!) 142/86  09/16/18 114/64  08/13/18 129/79       Reportedly has history of vitamin D deficiency, supposed to be on supplements, most recently vitamin D level 35        Examination:    BP (!) 142/86 (BP Location: Left Arm, Patient Position: Sitting, Cuff Size: Normal)   Pulse 86   Ht 5' 5.5" (1.664 m)   Wt 176 lb 9.6 oz (80.1 kg)   LMP 03/04/2014   SpO2 99%   BMI 28.94 kg/m   GENERAL:  Average build.   No pallor.    Skin:  no rash or evident skin lesions.  EYES:  No prominence of the eyes or swelling of the eyelids  ENT: Oral mucosa and tongue normal.  NECK: No lymphadenopathy  THYROID: She has about a 1.8 cm slightly firm nodule in the isthmus and to the left which is smooth There is no enlargement of the thyroid lobes  HEART:  Normal  S1 and S2; no murmur or click.  CHEST:    Lungs: Vescicular breath sounds heard equally.  No crepitations/ wheeze.  ABDOMEN:  No distention.  Liver and spleen not palpable.  No other mass or tenderness.  NEUROLOGICAL: Reflexes are bilaterally normal at biceps.  EXTREMITIES:  Normal peripheral joints.  No ankle edema present   Assessment:  HYPOTHYROIDISM, mild and  with highest baseline only about 5.6 She may have mild autoimmune thyroid disease with only mild increase in thyroglobulin antibody but not TPO  Although she does not have any typical symptoms of hypothyroidism except nonspecific fatigue she believes that with thyroid hormone supplements taken since 2017 she had better mental function and less fatigue  Most of her treatment has been initiated and adjusted by her psychiatrist who also appears to be a holistic physician and does not appear to have been treated with psychotropics and only ADD  medications spite her history of depression and anxiety  However with taking 75 mcg of levothyroxine in 11/19 she had symptoms of hyperthyroidism even with a TSH of 0.9 and currently not taking any supplements  THYROID nodules She has a mixed cystic/solid 1.7 cm nodule in the isthmus which does not require biopsy or follow-up based on the scoring system Reassured the patient that ultrasound evaluation is adequate and does not need a biopsy the exam shows the nodule to be about the same size as on previous ultrasound without any recent growth  ?  Prediabetes: Recent increase in fasting glucose of 107: Will repeat with A1c on the next visit  PLAN:  Since her TSH is trending higher and is upper normal as well as patient is requesting a trial of thyroid supplements again will consider doing this empirically She had apparently felt fairly good with a total of 50 mcg of levothyroxine Since even with 75 mcg of levothyroxine her free T3 level was below the mid range she may benefit from a combination of low-dose levothyroxine and liothyronine  Discussed that she can start taking levothyroxine 25 mcg daily for now and after 1 week at 5 mcg of liothyronine which would be the equivalent of 20 mcg of levothyroxine  A1c and fasting glucose No further evaluation needed for single 1.7 cm thyroid nodule  Follow-up in 6 weeks   Elayne Snare 11/10/2018, 4:19 PM   Consultation note copy sent to the PCP  Note: This office note was prepared with Dragon voice recognition system technology. Any transcriptional errors that result from this process are unintentional.

## 2018-11-10 NOTE — Patient Instructions (Signed)
Take Liothyronine 1 week later

## 2018-11-11 ENCOUNTER — Ambulatory Visit: Payer: Managed Care, Other (non HMO) | Admitting: Internal Medicine

## 2018-12-07 ENCOUNTER — Other Ambulatory Visit: Payer: Self-pay | Admitting: Endocrinology

## 2018-12-17 ENCOUNTER — Other Ambulatory Visit: Payer: Self-pay

## 2018-12-17 ENCOUNTER — Other Ambulatory Visit: Payer: Self-pay | Admitting: Endocrinology

## 2018-12-17 MED ORDER — LIOTHYRONINE SODIUM 5 MCG PO TABS: 5.0000 ug | ORAL_TABLET | Freq: Every day | ORAL | 1 refills | Status: DC

## 2019-01-05 ENCOUNTER — Other Ambulatory Visit: Payer: Self-pay

## 2019-01-07 ENCOUNTER — Ambulatory Visit: Payer: Managed Care, Other (non HMO) | Admitting: Endocrinology

## 2019-02-07 ENCOUNTER — Other Ambulatory Visit: Payer: Self-pay | Admitting: Endocrinology

## 2019-02-19 ENCOUNTER — Other Ambulatory Visit: Payer: Self-pay

## 2019-02-19 DIAGNOSIS — Z131 Encounter for screening for diabetes mellitus: Secondary | ICD-10-CM

## 2019-02-19 DIAGNOSIS — E039 Hypothyroidism, unspecified: Secondary | ICD-10-CM

## 2019-02-19 DIAGNOSIS — R7301 Impaired fasting glucose: Secondary | ICD-10-CM

## 2019-02-20 LAB — GLUCOSE, RANDOM: Glucose: 92 mg/dL (ref 65–99)

## 2019-02-20 LAB — TSH: TSH: 4.8 u[IU]/mL — ABNORMAL HIGH (ref 0.450–4.500)

## 2019-02-20 LAB — T4, FREE: Free T4: 0.85 ng/dL (ref 0.82–1.77)

## 2019-02-20 LAB — HEMOGLOBIN A1C
Est. average glucose Bld gHb Est-mCnc: 108 mg/dL
Hgb A1c MFr Bld: 5.4 % (ref 4.8–5.6)

## 2019-02-20 LAB — T3, FREE: T3, Free: 2.8 pg/mL (ref 2.0–4.4)

## 2019-02-25 ENCOUNTER — Encounter: Payer: Self-pay | Admitting: Internal Medicine

## 2019-02-25 ENCOUNTER — Other Ambulatory Visit: Payer: Self-pay | Admitting: Internal Medicine

## 2019-02-25 ENCOUNTER — Encounter: Payer: Managed Care, Other (non HMO) | Admitting: Internal Medicine

## 2019-02-25 DIAGNOSIS — E039 Hypothyroidism, unspecified: Secondary | ICD-10-CM

## 2019-02-25 NOTE — Progress Notes (Deleted)
Virtual Visit via Video Note  I connected with Susan Soto on 02/25/19 at  3:00 PM EDT by a video enabled telemedicine application and verified that I am speaking with the correct person using two identifiers.   I discussed the limitations of evaluation and management by telemedicine and the availability of in person appointments. The patient expressed understanding and agreed to proceed.  -Location of the patient : -Location of the provider : Office -The names of all persons participating in the telemedicine service : Pt and myself    Name: Susan Soto  MRN/ DOB: [id number], 1966/09/02    Age/ Sex: 53 y.o., female     PCP: Ria Bush, MD   Reason for Endocrinology Evaluation: Hypothyroidism     Initial Endocrinology Clinic Visit: 11/10/2018    PATIENT IDENTIFIER: Susan Soto is a 53 y.o., female with a past medical history of Breast ca (right lumpectomy in 2009) , depression and anxiety.  She has followed with [facility] since 11/10/2018 for consultative assistance with management of her hypothyroidism .   HISTORICAL SUMMARY: The patient was first diagnosed with hypothyroidism in 2017. She was having symptoms of difficulty concentrating and described a brain fog at the time, with fatigue, irritability , weight gain and anxiety. She was started on levothyroxine by her psychiatrist and pt felt improvement in her energy level and alertness.    SUBJECTIVE:   During last visit (***): ***  Today (02/25/2019):  Susan Soto is here for ****   ROS:  As per HPI.   HISTORY:  Past Medical History:  Past Medical History:  Diagnosis Date  . ADD (attention deficit disorder) 1990s  . Anxiety   . BRCA negative 05/20/2017   MyRisk neg  . 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   . Genetic testing of female 05/2017  . Heart murmur   . Hemorrhoids   . History of breast cancer 2009   Left side,  lumpectomy, s/p sentinal LN biopsy, chemo and radiation, remission  . Malignant neoplasm of upper-outer quadrant of female breast ([place]) 05/27/2008   Left breast, 1.3 cm triple negative, node negative invasive mammary carcinoma. T1c, N0  . Personal history of chemotherapy 2009   left breast ca  . Personal history of radiation therapy 2009   left breast ca  . Subclinical hypothyroidism   . Syncope and collapse     Past Surgical History:  Past Surgical History:  Procedure Laterality Date  . bartholin's cystectomy    . BREAST BIOPSY Left 05/07/08   Left (infitr ductal CA)- Dr. Bary Castilla  . BREAST BIOPSY Left 06/25/13   left (benign) atrophic ductal epithelium, microcalcifications, changes of previous surgery.  Marland Kitchen BREAST LUMPECTOMY Left 05/27/2008   invasive ductal carcinoma  . breast wide excision  05/27/08   Left-with sentinel node bx (Dr. Bary Castilla)  . COLONOSCOPY  07/2009   WNL ([place])  . CYSTOSCOPY    . episiotomy       Social History:  reports that she has never smoked. She has never used smokeless tobacco. She reports that she does not drink alcohol or use drugs. Family History:  Family History  Problem Relation Age of Onset  . Drug abuse Father        Heroin  . Hypertension Mother   . Fibromyalgia Mother   . ADD / ADHD Mother   . Depression Mother   . Cancer Mother  cervical, bladder, non melanoma skin  . Colon polyps Mother        Hyperplastic  . ADD / ADHD Brother   . Anxiety disorder Brother   . Stroke Maternal Grandmother   . Breast cancer Maternal Grandmother        50's  . Hypertension Paternal Grandmother   . Cancer Paternal Grandfather        lung  . Hypothyroidism Maternal Aunt   . Cancer Maternal Aunt        vulvar, cervical, non melanoma skin  . Hypothyroidism Maternal Uncle   . Cancer Maternal Aunt        cervical  . Diabetes Neg Hx   . Coronary artery disease Neg Hx       HOME MEDICATIONS: Allergies as of 02/25/2019      Reactions   Bupropion  Hcl    REACTION: Hives   Buspar [buspirone Hcl] Nausea And Vomiting   Other Other (See Comments)   Dissolving sutures Surgical glue bandaids   Strattera [atomoxetine Hcl] Other (See Comments)   Urinary trouble   Tegaderm Ag Mesh [silver]       Medication List       Accurate as of Feb 25, 2019  8:43 AM. Always use your most recent med list.        B-12 1000 MCG Subl Place 1 tablet under the tongue daily.   cloNIDine 0.1 MG tablet Commonly known as:  CATAPRES TAKE 1 TABLET BY MOUTH TWICE A DAY FOR 7 DAYS THEN TAKE 1 TO 2 TABLETS BY MOUTH TWICE A DAY   dextroamphetamine 10 MG tablet Commonly known as:  DEXTROSTAT Take 40 mg by mouth. TAKE 4 TABLETS (40MG) ONCE DAILY AT LUNCHTIME.   diazepam 2 MG tablet Commonly known as:  VALIUM Take 2 mg by mouth every 12 (twelve) hours as needed for anxiety (bladder retention).   estradiol 0.1 MG/GM vaginal cream Commonly known as:  ESTRACE VAGINAL Place 1 Applicatorful vaginally 3 (three) times a week.   levothyroxine 25 MCG tablet Commonly known as:  SYNTHROID TAKE 1 TABLET (25 MCG TOTAL) BY MOUTH DAILY BEFORE BREAKFAST.   liothyronine 5 MCG tablet Commonly known as:  CYTOMEL Take 1 tablet (5 mcg total) by mouth daily.   PROBIOTIC DAILY PO Take by mouth daily.   pyridoxine 100 MG tablet Commonly known as:  B-6 Take 100 mg by mouth daily.   STOOL SOFTENER PO Take by mouth as needed.   valACYclovir 1000 MG tablet Commonly known as:  VALTREX Take 2 tablets (2,000 mg total) by mouth 2 (two) times daily. x1 day   Vitamin D-3 125 MCG (5000 UT) Tabs Take 2 tablets by mouth daily.   zinc gluconate 50 MG tablet Take 50 mg by mouth daily.          DATA REVIEWED: ***    ASSESSMENT / PLAN / RECOMMENDATIONS:   1. ***  Plan:  ***    Medications   ***   I discussed the assessment and treatment plan with the patient. The patient was provided an opportunity to ask questions and all were answered. The patient  agreed with the plan and demonstrated an understanding of the instructions.   The patient was advised to call back or seek an in-person evaluation if the symptoms worsen or if the condition fails to improve as anticipated.  I provided *** minutes of non-face-to-face time during this encounter.   Signed electronically by: Mack Guise, MD  [facility] End[facility]  [facility] [address], [place], [place] [address] Phone: [phone] FAX: [phone]   CC: Ria Bush, MD [place] [address] Phone: [phone] Fax: [phone]   Return to Endocrinology clinic as below: Future Appointments  Date Time Provider [place]  02/25/2019  3:00 PM Anis Degidio, Melanie Crazier, MD [facility] None  09/22/2019  3:00 PM Ria Bush, MD [facility]

## 2019-03-04 ENCOUNTER — Other Ambulatory Visit: Payer: Self-pay

## 2019-03-04 ENCOUNTER — Encounter: Payer: Self-pay | Admitting: Internal Medicine

## 2019-03-04 ENCOUNTER — Ambulatory Visit (INDEPENDENT_AMBULATORY_CARE_PROVIDER_SITE_OTHER): Payer: Managed Care, Other (non HMO) | Admitting: Internal Medicine

## 2019-03-04 VITALS — BP 122/84 | HR 80 | Temp 97.6°F | Wt 170.4 lb

## 2019-03-04 DIAGNOSIS — E039 Hypothyroidism, unspecified: Secondary | ICD-10-CM | POA: Diagnosis not present

## 2019-03-04 DIAGNOSIS — F988 Other specified behavioral and emotional disorders with onset usually occurring in childhood and adolescence: Secondary | ICD-10-CM

## 2019-03-04 DIAGNOSIS — E559 Vitamin D deficiency, unspecified: Secondary | ICD-10-CM | POA: Diagnosis not present

## 2019-03-04 DIAGNOSIS — E041 Nontoxic single thyroid nodule: Secondary | ICD-10-CM | POA: Diagnosis not present

## 2019-03-04 DIAGNOSIS — F419 Anxiety disorder, unspecified: Secondary | ICD-10-CM

## 2019-03-04 DIAGNOSIS — E038 Other specified hypothyroidism: Secondary | ICD-10-CM

## 2019-03-04 MED ORDER — LEVOTHYROXINE SODIUM 50 MCG PO TABS: 50.0000 ug | ORAL_TABLET | Freq: Every day | ORAL | 3 refills | Status: DC

## 2019-03-04 NOTE — Patient Instructions (Addendum)
You are on levothyroxine - which is your thyroid hormone supplement. You MUST take this consistently.  You should take this first thing in the morning on an empty stomach with water. You should not take it with other medications. Wait 50min to 1hr prior to eating. If you are taking any vitamins - please take these in the evening.   If you miss a dose, please take your missed dose the following day (double the dose for that day). You should have a pill box for ONLY levothyroxine on your bedside table to help you remember to take your medications.    Labs in 7-8 weeks

## 2019-03-04 NOTE — Progress Notes (Signed)
Name: Susan Soto  MRN/ DOB: [id number], [birth year]    Age/ Sex: 53 y.o., female     PCP: Ria Bush, MD   Reason for Endocrinology Evaluation: Hypothyroidism      Initial Endocrinology Clinic Visit: 11/10/2018    PATIENT IDENTIFIER: [name] is a 53 y.o., female with a past medical history of ADHD and anxiety . She has followed with [facility] since 11/10/2018 for consultative assistance with management of her Hypothyroidism.   HISTORICAL SUMMARY: The patient was first diagnosed with hypothyroidism in 2017. At the time she was having difficulty with concentration, fatigue, irritability and weight gain as well as anxiety.  TFT's were checked by her psychiatrist and as per patient due to mild elevation in her TSH, levothyroxine 25 mcg daily was initiated which temporarily help improve her symptoms. There was a gradual increase in her dose to 50 mcg then to 75 mcg daily that's when she started having symptoms of heat intolerance, excessive sweating, shakiness and palpitations.   Since then the patient stopped all LT-4 replacement.   She saw Dr. Dwyane Dee in 10/2018, her TSH 4.8 uIU/mL but due to her symptoms and her request to restart LT-4 replacement,  she was started on Levothyroxine 25 mcg daily and Cytomel at 5 mcg daily.    Pt also had a thyroid ultrasound in 09/2018 due to palpable abnormality , which demonstrated a   1.7 cm isthmic nodule that does not require any further follow up based on the ACR TI-RADS criteria   SUBJECTIVE:   During last visit (10/2018): TSH 4.8 uIU/mL but due to her symptoms and her request to restart LT-4 replacement,  she was started on Levothyroxine 25 mcg daily and Cytomel at 5 mcg daily.    Today (03/05/2019):  Susan Soto is here for a follow up on subclinical hyperthyroidism. She tried levothyroxine 25 mcg and cytomel 5 mg just for a few days but then stopped on her own. She is very anxious and all her complaints today have  to do with emotional issues to include anxiety, irritability, cries easily, easily overwhelmed, insomnia, fatigue, inattention issues.  She has cut down on dextroamphetamine dosing, she was on both extended release and regular release and has stopped using the extended release all together and has cut down on regular release to 30 mg daily.   Pt even describes herself as being manic at times, she brought these concerns to her psychiatrist , but per patient psychiatrist wanted her TFT's to be normalized prior to labeling her as bipolar.   Has strong FH of thyroid disease.   She was found to have a thyroid nodule on ultrasound , per ACR TI-RAD criteria does not need follow up but she is concerned that this may turn  In to cancer like her breast cancer in 2009 , it was a nodule.  She feels her neck has gotten larger in the past 5 months.     Pt also tells me her mother has hx of parathyroid nodules and adrenal nodules, but no hx of surgical resections.    ROS:  As per HPI.   HISTORY:  Past Medical History:  Past Medical History:  Diagnosis Date  . ADD (attention deficit disorder) 1990s  . Anxiety   . BRCA negative 05/20/2017   MyRisk neg  . 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   . Genetic testing of female 05/2017  . Heart  murmur   . Hemorrhoids   . History of breast cancer 2009   Left side, lumpectomy, s/p sentinal LN biopsy, chemo and radiation, remission  . 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 T1c, N0  . Personal history of chemotherapy 2009   left breast ca  . Personal history of radiation therapy 2009   left breast ca  . Subclinical hypothyroidism   . Syncope and collapse    Past Surgical History:  Past Surgical History:  Procedure Laterality Date  . bartholin's cystectomy    . BREAST  BIOPSY Left 05/07/08   Left (infitr ductal CA)- Dr. Bary Castilla  . BREAST BIOPSY Left 06/25/13   left (benign) atrophic ductal epithelium, microcalcifications, changes of previous surgery.  Marland Kitchen BREAST LUMPECTOMY Left 05/27/2008   invasive ductal carcinoma  . breast wide excision  05/27/08   Left-with sentinel node bx (Dr. Bary Castilla)  . COLONOSCOPY  07/2009   WNL ([place])  . CYSTOSCOPY    . episiotomy      Social History:  reports that she has never smoked. She has never used smokeless tobacco. She reports that she does not drink alcohol or use drugs. Family History:  Family History  Problem Relation Age of Onset  . Drug abuse Father        Heroin  . Hypertension Mother   . Fibromyalgia Mother   . ADD / ADHD Mother   . Depression Mother   . Cancer Mother        cervical, bladder, non melanoma skin  . Colon polyps Mother        Hyperplastic  . ADD / ADHD Brother   . Anxiety disorder Brother   . Stroke Maternal Grandmother   . Breast cancer Maternal Grandmother        50's  . Hypertension Paternal Grandmother   . Cancer Paternal Grandfather        lung  . Hypothyroidism Maternal Aunt   . Cancer Maternal Aunt        vulvar, cervical, non melanoma skin  . Hypothyroidism Maternal Uncle   . Cancer Maternal Aunt        cervical  . Diabetes Neg Hx   . Coronary artery disease Neg Hx      HOME MEDICATIONS: Allergies as of 03/04/2019      Reactions   Bupropion Hcl    REACTION: Hives   Buspar [buspirone Hcl] Nausea And Vomiting   Other Other (See Comments)   Dissolving sutures Surgical glue bandaids   Strattera [atomoxetine Hcl] Other (See Comments)   Urinary trouble   Tegaderm Ag Mesh [silver]       Medication List       Accurate as of Mar 04, 2019 11:59 PM. If you have any questions, ask your nurse or doctor.        STOP taking these medications   cloNIDine 0.1 MG tablet Commonly known as:  CATAPRES Stopped by:  Dorita Sciara, MD   estradiol 0.1 MG/GM vaginal cream  Commonly known as:  ESTRACE VAGINAL Stopped by:  Dorita Sciara, MD   liothyronine 5 MCG tablet Commonly known as:  CYTOMEL Stopped by:  Dorita Sciara, MD     TAKE these medications   B-12 1000 MCG Subl Place 1 tablet under the tongue daily.   dextroamphetamine 10 MG tablet Commonly known as:  DEXTROSTAT Take 40 mg by mouth. TAKE 3  TABLETS (40MG) ONCE DAILY AT LUNCHTIME.   diazepam 2 MG tablet Commonly known as:  VALIUM Take 2 mg by mouth every 12 (twelve) hours as needed for anxiety (bladder retention).   levothyroxine 50 MCG tablet Commonly known as:  SYNTHROID Take 1 tablet (50 mcg total) by mouth daily. What changed:    medication strength  how much to take  when to take this Changed by:  Dorita Sciara, MD   PROBIOTIC DAILY PO Take by mouth daily.   pyridoxine 100 MG tablet Commonly known as:  B-6 Take 100 mg by mouth daily.   STOOL SOFTENER PO Take by mouth as needed.   valACYclovir 1000 MG tablet Commonly known as:  VALTREX Take 2 tablets (2,000 mg total) by mouth 2 (two) times daily. x1 day   Vitamin D-3 125 MCG (5000 UT) Tabs Take 2 tablets by mouth daily.   zinc gluconate 50 MG tablet Take 50 mg by mouth daily.         OBJECTIVE:   PHYSICAL EXAM: VS: BP 122/84 (BP Location: Right Arm, Patient Position: Sitting, Cuff Size: Normal)   Pulse 80   Temp 97.6 F (36.4 C) (Oral)   Wt 170 lb 6.4 oz (77.3 kg)   LMP 03/04/2014   SpO2 98%   BMI 27.92 kg/m    EXAM: General: Pt appears very anxious   Eyes: External eye exam normal without stare, lid lag or exophthalmos.  EOM intact.   Neck: General: Supple without adenopathy. Thyroid: Thyroid size normal.  No goiter or nodules appreciated. No thyroid bruit.  Lungs: Clear with good BS bilat with no rales, rhonchi, or wheezes  Heart: Auscultation: RRR.  Abdomen: Normoactive bowel sounds, soft, nontender, without masses or organomegaly palpable  Extremities:  BL LE: No  pretibial edema normal ROM and strength.  Mental Status: Judgment, insight: Intact Memory: Intact for recent and remote events Mood and affect: Anxious      DATA REVIEWED: Results for KYNZI, LEVAY (MRN [id number]) as of 03/05/2019 10:27  Ref. Range 02/19/2019 16:29 03/04/2019 00:00  TSH Latest Ref Range: 0.450 - 4.500 uIU/mL 4.800 (H)   Triiodothyronine,Free,Serum Latest Ref Range: 2.0 - 4.4 pg/mL 2.8   T4,Free(Direct) Latest Ref Range: 0.82 - 1.77 ng/dL 0.85   Thyroperoxidase Ab SerPl-aCnc Latest Ref Range: 0 - 34 IU/mL  12   Results for MCKAILA, DUFFUS (MRN [id number]) as of 03/05/2019 10:27  Ref. Range 03/04/2019 00:00  Vitamin D, 25-Hydroxy Latest Ref Range: 30.0 - 100.0 ng/mL 42.4     ASSESSMENT / PLAN / RECOMMENDATIONS:   1. Subclinical Hypothyroidism:    - Pt with multiple non-specific symptoms that are NOT related to her thyroid  - I have tried to reassure her that a TSH of 4.8 uIU/mL does not give her symptoms and it certainly would not cause so much anxiety, this is more typical of hyperthyroidism which she does not have at this time.  - Pt under the impression  That TSH of 1.9 uIU/mL is required to get her attention span back, I have explained to her that I am not aware of this, and that in pt with Hashimoto's thyroiditis and fatigue pts do benefit from having a TSH ~ 2.5 uIU/mL but per her TPO-Ab , she does NOT have Hashimoto's thyroid disease.  - We will restart levothyroxine 50 mcg daily , to see if we optimizing her TSH  - Pt educated extensively on the correct way to take levothyroxine (first thing in the morning  with water, 30 minutes before eating or taking other medications). - Pt encouraged to double dose the following day if she were to miss a dose given long half-life of levothyroxine.   Medications   Levothyroxine 50 mcg daily  Labs in 7 weeks - she was provided with printed lab order to [facility]   2. Thyroid Nodule :  - Ultrasound in 09/2018  demonstrated an isoechoic sold/cystic nodule ~ 1.7 cm in diameter, per ACR TI-RAD criteria does not meet follow up, but she is extremely anxious about it , I have tried to reassure her at this time.  - Will consider repeating ultrasound on next visit    3. ADD/Anxeity :   - This is pretty severe, I do believe this is her main issue and I suggested she gets  this addressed as a priority.    4. FH of adrenal and parathyroid tumors:   - Given the history of mother not having to undergo parathyroid or adrenal surgery is reassuring that these were non-secretary - She has no clinical signs/symptom of hyperparathyroidism, pheochromocytoma , hypercortisolism or hyperaldosteronism.  - Reassurance again provided.   5. Hx of Vitamin D insufficiency :  - Pt tells me she was on 5000 iu of vitamin d in the past but despite that this was not enough. She has not taken her vitamin D in a while, but repeat testing shows normal Vitamin D levels.      F/u in 4 months   Over 25 minutes was spent with the patient, > 50% of the time was spent educating and answering questions   Signed electronically by: Mack Guise, MD  [facility]  [facility] [address] [place], [place] [address] Phone: [phone] FAX: [phone]      CC: Ria Bush, MD [place] [address] Phone: [phone]  Fax: [phone]   Return to Endocrinology clinic as below: Future Appointments  Date Time Provider [place]  09/22/2019  3:00 PM Ria Bush, MD [facility]

## 2019-03-05 ENCOUNTER — Encounter: Payer: Self-pay | Admitting: Internal Medicine

## 2019-03-05 LAB — THYROID PEROXIDASE ANTIBODY: Thyroperoxidase Ab SerPl-aCnc: 12 IU/mL (ref 0–34)

## 2019-03-05 LAB — VITAMIN D 25 HYDROXY (VIT D DEFICIENCY, FRACTURES): Vit D, 25-Hydroxy: 42.4 ng/mL (ref 30.0–100.0)

## 2019-05-12 ENCOUNTER — Encounter: Payer: Self-pay | Admitting: General Surgery

## 2019-06-11 ENCOUNTER — Ambulatory Visit (INDEPENDENT_AMBULATORY_CARE_PROVIDER_SITE_OTHER): Payer: Managed Care, Other (non HMO) | Admitting: Family Medicine

## 2019-06-11 ENCOUNTER — Telehealth: Payer: Self-pay | Admitting: Family Medicine

## 2019-06-11 ENCOUNTER — Other Ambulatory Visit: Payer: Self-pay

## 2019-06-11 ENCOUNTER — Encounter: Payer: Self-pay | Admitting: Family Medicine

## 2019-06-11 VITALS — BP 112/66 | HR 82 | Temp 97.8°F | Ht 65.5 in | Wt 171.6 lb

## 2019-06-11 DIAGNOSIS — W57XXXA Bitten or stung by nonvenomous insect and other nonvenomous arthropods, initial encounter: Secondary | ICD-10-CM | POA: Diagnosis not present

## 2019-06-11 DIAGNOSIS — F419 Anxiety disorder, unspecified: Secondary | ICD-10-CM

## 2019-06-11 DIAGNOSIS — R5382 Chronic fatigue, unspecified: Secondary | ICD-10-CM

## 2019-06-11 DIAGNOSIS — E039 Hypothyroidism, unspecified: Secondary | ICD-10-CM | POA: Diagnosis not present

## 2019-06-11 DIAGNOSIS — G479 Sleep disorder, unspecified: Secondary | ICD-10-CM

## 2019-06-11 DIAGNOSIS — R202 Paresthesia of skin: Secondary | ICD-10-CM

## 2019-06-11 DIAGNOSIS — E041 Nontoxic single thyroid nodule: Secondary | ICD-10-CM

## 2019-06-11 DIAGNOSIS — R4189 Other symptoms and signs involving cognitive functions and awareness: Secondary | ICD-10-CM

## 2019-06-11 DIAGNOSIS — E559 Vitamin D deficiency, unspecified: Secondary | ICD-10-CM

## 2019-06-11 DIAGNOSIS — E038 Other specified hypothyroidism: Secondary | ICD-10-CM

## 2019-06-11 DIAGNOSIS — F900 Attention-deficit hyperactivity disorder, predominantly inattentive type: Secondary | ICD-10-CM

## 2019-06-11 DIAGNOSIS — Z13220 Encounter for screening for lipoid disorders: Secondary | ICD-10-CM

## 2019-06-11 MED ORDER — SCOPOLAMINE 1 MG/3DAYS TD PT72: 1.0000 | MEDICATED_PATCH | TRANSDERMAL | 1 refills | Status: DC

## 2019-06-11 NOTE — Progress Notes (Signed)
This visit was conducted in person.  BP 112/66 (BP Location: Left Arm, Patient Position: Sitting, Cuff Size: Large)   Pulse 82   Temp 97.8 F (36.6 C) (Temporal)   Ht 5' 5.5" (1.664 m)   Wt 171 lb 9 oz (77.8 kg)   LMP 03/04/2014   SpO2 99%   BMI 28.12 kg/m    CC: several issues Subjective:    Patient ID: Susan Soto, female    DOB: January 26, 1966, 53 y.o.   MRN: [id number]  HPI: Susan Soto is a 53 y.o. female presenting on 06/11/2019 for Discuss Thyroid (Wants to discuss thyroid nodule. ), Memory Loss (C/o brain fog and anxiety ), Form Completion (Needs wellness form completed), and Peripheral Neuropathy (C/o severe pain and tingling in fingers/toes, mostly in fingers. Started in last 3 wks. )   Last CPE 08/2018 - needs wellness form filled out today.  She has seen endocrinology Dwyane Dee and [facility]). Records reviewed. Doesn't think she'll return.   Currently on FMLA through psychiatrist for increasing anxiety with panic attacks this year (Dr Matthias Hughs at [facility] in [place]). 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 Noticing increasing brain fog, anxiety, trouble with memory (executive function difficulty). Pt states psychiatrist wanted PCP to evaluate, consider neurology eval for newly noted cognitive trouble. Psychiatrist thought she had a copper/zinc imbalance. She has started zinc as well as anti oxidants, vit C, D, E, coconut oil (MCTs). She has been on low fat diet since 2011 since she lost weight. Psychiatrist feels symptoms are more than just ADHD, has recommended sleep study as well. Patient endorses non restorative sleep, snoring, can awaken gasping for breath. No witnessed apneic episodes. No abnormal sleep movements noted. Marked fatigue in the morning - has needed sleep accommodation through psychiatrist to get to work at 10:30am.   3 wk h/o paresthesias described as sharp pain and tingling in fingers (#1-4 fingers)  bilaterally. No alcohol or diabetes history. She has started using wrist brace at night with benefit. Notes some swelling of fingers. Doesn't think she's taking B12 supplement.   Thyroid nodule - 1.7cm isthmus nodule (09/2018) - did not meet criteria for retesting but she remains worried. She feels it is enlarging. No dysphagia or globus sensation. She is clearing throat more.   Requests zinc, copper, and TFTs checked again. Will check vit B12.  Has had multiple ticks removed this year - many small ticks.  Found out has mold under her house as well.      Relevant past medical, surgical, family and social history reviewed and updated as indicated. Interim medical history since our last visit reviewed. Allergies and medications reviewed and updated. Outpatient Medications Prior to Visit  Medication Sig Dispense Refill  . Cholecalciferol (VITAMIN D-3) 5000 UNITS TABS Take 2 tablets by mouth daily.     . Cyanocobalamin (B-12) 1000 MCG SUBL Place 1 tablet under the tongue daily. 30 each   . dextroamphetamine (DEXTROSTAT) 10 MG tablet Take 40 mg by mouth. TAKE 3 TABLETS (40MG ) ONCE DAILY AT LUNCHTIME.  0  . diazepam (VALIUM) 2 MG tablet Take 2 mg by mouth every 12 (twelve) hours as needed for anxiety (bladder retention).     Mariane Baumgarten Calcium (STOOL SOFTENER PO) Take by mouth as needed.    Marland Kitchen levothyroxine (SYNTHROID) 50 MCG tablet Take 1 tablet (50 mcg total) by mouth daily. 90 tablet 3  . medium chain triglycerides (MCT OIL) oil Take  by mouth 3 (three) times daily. Takes 3 TBSP daily    . Probiotic Product (PROBIOTIC DAILY PO) Take by mouth daily.    Marland Kitchen pyridoxine (B-6) 100 MG tablet Take 100 mg by mouth daily.    . valACYclovir (VALTREX) 1000 MG tablet Take 2 tablets (2,000 mg total) by mouth 2 (two) times daily. x1 day 20 tablet 1  . zinc gluconate 50 MG tablet Take 50 mg by mouth daily.    . Influenza vac split quadrivalent PF (FLUARIX) injection 0.5 mL      No facility-administered  medications prior to visit.      Per HPI unless specifically indicated in ROS section below Review of Systems Objective:    BP 112/66 (BP Location: Left Arm, Patient Position: Sitting, Cuff Size: Large)   Pulse 82   Temp 97.8 F (36.6 C) (Temporal)   Ht 5' 5.5" (1.664 m)   Wt 171 lb 9 oz (77.8 kg)   LMP 03/04/2014   SpO2 99%   BMI 28.12 kg/m   Wt Readings from Last 3 Encounters:  06/11/19 171 lb 9 oz (77.8 kg)  03/04/19 170 lb 6.4 oz (77.3 kg)  11/10/18 176 lb 9.6 oz (80.1 kg)    Physical Exam Vitals signs and nursing note reviewed.  Constitutional:      General: She is not in acute distress.    Appearance: Normal appearance. She is not ill-appearing.  HENT:     Head: Normocephalic and atraumatic.     Mouth/Throat:     Mouth: Mucous membranes are moist.     Pharynx: No posterior oropharyngeal erythema.  Eyes:     Extraocular Movements: Extraocular movements intact.     Pupils: Pupils are equal, round, and reactive to light.  Neck:     Musculoskeletal: Normal range of motion and neck supple.     Thyroid: Thyroid mass (L thyroid nodule present) present.  Cardiovascular:     Rate and Rhythm: Normal rate and regular rhythm.     Pulses: Normal pulses.     Heart sounds: Normal heart sounds. No murmur.  Pulmonary:     Effort: Pulmonary effort is normal. No respiratory distress.     Breath sounds: Normal breath sounds. No wheezing, rhonchi or rales.  Musculoskeletal: Normal range of motion.        General: No swelling.     Right lower leg: No edema.     Left lower leg: No edema.  Lymphadenopathy:     Cervical: No cervical adenopathy.  Skin:    General: Skin is warm and dry.     Findings: No erythema or rash.  Neurological:     General: No focal deficit present.     Mental Status: She is alert. Mental status is at baseline.     Sensory: Sensation is intact.     Motor: Motor function is intact.     Coordination: Coordination is intact.     Comments: Bilateral R>L  positive phalen, tinel  Psychiatric:        Mood and Affect: Mood normal.        Behavior: Behavior normal.       Results for orders placed or performed in visit on 06/11/19  TSH  Result Value Ref Range   TSH 2.580 0.450 - 4.500 uIU/mL  T4, free  Result Value Ref Range   Free T4 1.13 0.82 - 1.77 ng/dL  Vitamin B12  Result Value Ref Range   Vitamin B-12 740 232 - 1,245 pg/mL  Zinc  Result Value Ref Range   Zinc WILL FOLLOW   Copper, Serum  Result Value Ref Range   Copper WILL FOLLOW   Lyme Ab/Western Blot Reflex  Result Value Ref Range   Lyme IgG/IgM Ab <0.91 0.00 - 0.90 ISR   LYME DISEASE AB, QUANT, IGM <0.80 0.00 - 0.79 index   Assessment & Plan:  Requests anti-sea sickness medication for upcoming fishing trip - scopolamine patch sent to pharmacy Problem List Items Addressed This Visit    Thyroid nodule    Known thyroid nodule last evaluated 09/2018 - at that time didn't meet criteria for biopsy or continued monitoring however endorses feeling nodule enlarging. Will update thyroid imaging.       Subclinical hypothyroidism    H/o this, initially followed by psychiatry, saw endo temporarily, currently stable on 37mcg levothyroxine daily.       Relevant Orders   TSH (Completed)   T4, free (Completed)   Sleep disturbance    Endorsing non restorative sleep, marked daytime fatigue, trouble getting out of bed in the morning affecting work responsibilities, and snoring. Will refer to neurology for further eval for sleep apnea, parasomnias, other sleep disorders.       Relevant Orders   Ambulatory referral to Neurology   Paresthesia of both hands    Exam suspicious for CTS bilaterally. She has started using wrist brace at night with benefit. Will let me know if worsening for referral to hand surgery .      Relevant Orders   Vitamin B12 (Completed)   Zinc (Completed)   Copper, Serum (Completed)   Cognitive changes - Primary    Pt and psychiatrist concerned about noted  deepening cognitive dysfunction. ?sleep related - will refer to neurology for sleep evaluation.       Relevant Orders   Ambulatory referral to Neurology   Chronic fatigue    Has started vitamin replacements, zinc, antixodant supplements and medium chain triglycerides (coconut oil) - discussed possible increase in LDL associated with coconut oil, and need to monitor FLP (will check at upcoming physical). Update zinc/copper levels per pt/psych request.       Relevant Orders   Ambulatory referral to Neurology   Attention deficit hyperactivity disorder (ADHD), predominantly inattentive type    Followed by psych.       Anxiety disorder    Followed by psych       Other Visit Diagnoses    Tick bite, initial encounter       Relevant Orders   Lyme Ab/Western Blot Reflex   Lyme Ab/Western Blot Reflex (Completed)       Meds ordered this encounter  Medications  . scopolamine (TRANSDERM-SCOP, 1.5 MG,) 1 MG/3DAYS    Sig: Place 1 patch (1.5 mg total) onto the skin every 3 (three) days.    Dispense:  4 patch    Refill:  1   Orders Placed This Encounter  Procedures  . TSH  . T4, free  . Vitamin B12  . Zinc  . Copper, Serum  . Lyme Ab/Western Blot Reflex  . Lyme Ab/Western Blot Reflex  . Ambulatory referral to Neurology    Referral Priority:   Routine    Referral Type:   Consultation    Referral Reason:   Specialty Services Required    Requested Specialty:   Neurology    Number of Visits Requested:   1    Follow up plan: Return in about 3 months (around 09/11/2019), or if symptoms worsen or  fail to improve, for annual exam, prior fasting for blood work.  Ria Bush, MD

## 2019-06-11 NOTE — Telephone Encounter (Signed)
Pt wants you to send labs over for physical to [facility] so she can labs done there. She said it doesn't cost her when she gets her labs done at [facility]. Pt is scheduled for cpe on 09/22/19.

## 2019-06-11 NOTE — Patient Instructions (Addendum)
Labs today We will set you up for sleep evaluation and neurology evaluation. Hand numbness likely from carpal tunnel syndrome. Continue wrist braces.  We will set up thyroid ultrasound as well.  Return in 3 months for physical with labs.

## 2019-06-13 DIAGNOSIS — R4189 Other symptoms and signs involving cognitive functions and awareness: Secondary | ICD-10-CM | POA: Insufficient documentation

## 2019-06-13 DIAGNOSIS — G5603 Carpal tunnel syndrome, bilateral upper limbs: Secondary | ICD-10-CM | POA: Insufficient documentation

## 2019-06-13 DIAGNOSIS — G479 Sleep disorder, unspecified: Secondary | ICD-10-CM | POA: Insufficient documentation

## 2019-06-13 DIAGNOSIS — F09 Unspecified mental disorder due to known physiological condition: Secondary | ICD-10-CM | POA: Insufficient documentation

## 2019-06-13 DIAGNOSIS — R202 Paresthesia of skin: Secondary | ICD-10-CM | POA: Insufficient documentation

## 2019-06-13 NOTE — Assessment & Plan Note (Signed)
Pt and psychiatrist concerned about noted deepening cognitive dysfunction. ?sleep related - will refer to neurology for sleep evaluation.

## 2019-06-13 NOTE — Assessment & Plan Note (Signed)
Followed by psych

## 2019-06-13 NOTE — Assessment & Plan Note (Addendum)
Has started vitamin replacements, zinc, antixodant supplements and medium chain triglycerides (coconut oil) - discussed possible increase in LDL associated with coconut oil, and need to monitor FLP (will check at upcoming physical). Update zinc/copper levels per pt/psych request.

## 2019-06-13 NOTE — Assessment & Plan Note (Addendum)
Endorsing non restorative sleep, marked daytime fatigue, trouble getting out of bed in the morning affecting work responsibilities, and snoring. Will refer to neurology for further eval for sleep apnea, parasomnias, other sleep disorders.

## 2019-06-13 NOTE — Assessment & Plan Note (Addendum)
H/o this, initially followed by psychiatry, saw endo temporarily, currently stable on 38mcg levothyroxine daily.

## 2019-06-13 NOTE — Assessment & Plan Note (Signed)
Known thyroid nodule last evaluated 09/2018 - at that time didn't meet criteria for biopsy or continued monitoring however endorses feeling nodule enlarging. Will update thyroid imaging.

## 2019-06-13 NOTE — Assessment & Plan Note (Signed)
Exam suspicious for CTS bilaterally. She has started using wrist brace at night with benefit. Will let me know if worsening for referral to hand surgery .

## 2019-06-14 LAB — T4, FREE: Free T4: 1.13 ng/dL (ref 0.82–1.77)

## 2019-06-14 LAB — LYME AB/WESTERN BLOT REFLEX
LYME DISEASE AB, QUANT, IGM: <0.8 index (ref 0.00–0.79)
Lyme IgG/IgM Ab: <0.91 {ISR} (ref 0.00–0.90)

## 2019-06-14 LAB — ZINC: Zinc: 90 ug/dL (ref 56–134)

## 2019-06-14 LAB — COPPER, SERUM: Copper: 103 ug/dL (ref 72–166)

## 2019-06-14 LAB — TSH: TSH: 2.58 u[IU]/mL (ref 0.450–4.500)

## 2019-06-14 LAB — VITAMIN B12: Vitamin B-12: 740 pg/mL (ref 232–1245)

## 2019-06-15 ENCOUNTER — Encounter: Payer: Self-pay | Admitting: Family Medicine

## 2019-06-15 DIAGNOSIS — E041 Nontoxic single thyroid nodule: Secondary | ICD-10-CM

## 2019-06-16 ENCOUNTER — Other Ambulatory Visit: Payer: Self-pay

## 2019-06-16 DIAGNOSIS — Z1231 Encounter for screening mammogram for malignant neoplasm of breast: Secondary | ICD-10-CM

## 2019-06-19 NOTE — Telephone Encounter (Signed)
Attempted to contact pt.  Vm box is full.  Need to relay Dr. Synthia Innocent message.

## 2019-06-19 NOTE — Telephone Encounter (Signed)
Pt aware of dr g comments 

## 2019-06-19 NOTE — Addendum Note (Signed)
Addended by: Ria Bush on: 06/19/2019 09:04 AM   Modules accepted: Orders

## 2019-06-19 NOTE — Telephone Encounter (Addendum)
Future fasting labs ordered to [facility].

## 2019-06-22 NOTE — Telephone Encounter (Signed)
Noted  

## 2019-06-30 ENCOUNTER — Other Ambulatory Visit: Payer: Self-pay

## 2019-06-30 ENCOUNTER — Ambulatory Visit
Admission: RE | Admit: 2019-06-30 | Discharge: 2019-06-30 | Disposition: A | Discharge status: Home / Self Care | Payer: Managed Care, Other (non HMO) | Source: Ambulatory Visit | Attending: Family Medicine | Admitting: Family Medicine

## 2019-06-30 DIAGNOSIS — E041 Nontoxic single thyroid nodule: Secondary | ICD-10-CM | POA: Insufficient documentation

## 2019-07-02 ENCOUNTER — Other Ambulatory Visit: Payer: Self-pay

## 2019-07-02 ENCOUNTER — Encounter: Payer: Self-pay | Admitting: Neurology

## 2019-07-02 ENCOUNTER — Telehealth: Payer: Self-pay | Admitting: Neurology

## 2019-07-02 ENCOUNTER — Ambulatory Visit (INDEPENDENT_AMBULATORY_CARE_PROVIDER_SITE_OTHER): Payer: Managed Care, Other (non HMO) | Admitting: Neurology

## 2019-07-02 VITALS — BP 132/88 | HR 89 | Temp 98.0°F | Ht 65.5 in | Wt 172.0 lb

## 2019-07-02 DIAGNOSIS — E039 Hypothyroidism, unspecified: Secondary | ICD-10-CM | POA: Diagnosis not present

## 2019-07-02 DIAGNOSIS — Z853 Personal history of malignant neoplasm of breast: Secondary | ICD-10-CM

## 2019-07-02 DIAGNOSIS — R4189 Other symptoms and signs involving cognitive functions and awareness: Secondary | ICD-10-CM

## 2019-07-02 DIAGNOSIS — G5601 Carpal tunnel syndrome, right upper limb: Secondary | ICD-10-CM

## 2019-07-02 DIAGNOSIS — F900 Attention-deficit hyperactivity disorder, predominantly inattentive type: Secondary | ICD-10-CM | POA: Diagnosis not present

## 2019-07-02 DIAGNOSIS — R5382 Chronic fatigue, unspecified: Secondary | ICD-10-CM

## 2019-07-02 DIAGNOSIS — E038 Other specified hypothyroidism: Secondary | ICD-10-CM

## 2019-07-02 DIAGNOSIS — F419 Anxiety disorder, unspecified: Secondary | ICD-10-CM

## 2019-07-02 DIAGNOSIS — I493 Ventricular premature depolarization: Secondary | ICD-10-CM

## 2019-07-02 NOTE — Patient Instructions (Signed)
Attention Deficit Hyperactivity Disorder, Adult Attention deficit hyperactivity disorder (ADHD) is a mental health disorder that starts during childhood. For many people with ADHD, the disorder continues into adult years. There are many things that you and your health care provider or therapist (mental health professional) can do to manage your symptoms. What are the causes? The exact cause of ADHD is not known. What increases the risk? You are more likely to develop this condition if:  You have a family history of ADHD.  You are female.  You were born to a mother who smoked or drank alcohol during pregnancy.  You were exposed to lead poisoning or other toxins in the womb or in early life.  You were born before 37 weeks of pregnancy (prematurely) or you had a low birth weight.  You have experienced a brain injury. What are the signs or symptoms? Symptoms of this condition depend on the type of ADHD. The two main types are inattentive and hyperactive-impulsive. Some people may have symptoms of both types. Symptoms of the inattentive type include:  Difficulty watching, listening, or thinking with focused effort (paying attention).  Making careless mistakes.  Not listening.  Not following instructions.  Being disorganized.  Avoiding tasks that require time and attention.  Losing things.  Forgetting things.  Being easily distracted. Symptoms of the hyperactive-impulsive type include:  Restlessness.  Talking too much.  Interrupting.  Difficulty with: ? Sitting still. ? Staying quiet. ? Feeling motivated. ? Relaxing. ? Waiting in line or waiting for a turn. How is this diagnosed? This condition is diagnosed based on your current symptoms and your history of symptoms. The diagnosis can be made by a provider such as a primary care provider, psychiatrist, psychologist, or clinical social worker. The provider may use a symptom checklist or a standardized behavior rating  scale to evaluate your symptoms. He or she may want to talk with family members who have known you for a long time and have observed your behaviors. There are no lab tests or brain imaging tests that can diagnose ADHD. How is this treated? This condition can be treated with medicines and behavior therapy. Medicines may be the best option to reduce impulsive behaviors and improve attention. Your health care provider may recommend:  Stimulant medicines. These are the most common medicines used for adult ADHD. They affect certain chemicals in the brain (neurotransmitters). These medicines may be long-acting or short-acting. This will determine how often you need to take the medicine.  A non-stimulant medicine for adult ADHD (atomoxetine). This medicine increases a neurotransmitter called norepinephrine. It may take weeks to months to see effects from this medicine. Psychotherapy and behavioral management are also important for treating ADHD. Psychotherapy is often used along with medicine. Your health care provider may suggest:  Cognitive behavioral therapy (CBT). This type of therapy teaches you to replace negative thoughts and actions with positive thoughts and actions. When used as part of ADHD treatment, this therapy may also include: ? Coping strategies for organization, time management, impulse control, and stress reduction. ? Mindfulness and meditation training.  Behavioral management. This may include strategies for organization and time management. You may work with an ADHD coach who is specially trained to help people with ADHD to manage and organize activities and to function more effectively. Follow these instructions at home: Medicines   Take over-the-counter and prescription medicines only as told by your health care provider.  Talk with your health care provider about the possible side effects of your   medicine to watch for. General instructions   Learn as much as you can about  adult ADHD, and work closely with your health care providers to find the treatments that work best for you.  Do not use drugs or abuse alcohol. Limit alcohol intake to no more than 1 drink a day for nonpregnant women and 2 drinks a day for men. One drink equals 12 oz of beer, 5 oz of wine, or 1 oz of hard liquor.  Follow the same schedule each day. Make sure your schedule includes enough time for you to get plenty of sleep.  Use reminder devices like notes, calendars, and phone apps to stay on-time and organized.  Eat a healthy diet. Do not skip meals.  Exercise regularly. Exercise can help to reduce stress and anxiety.  Keep all follow-up visits as told by your health care provider and therapist. This is important. Where to find more information  A health care provider may be able to recommend resources that are available online or over the phone. You could start with: ? Attention Deficit Disorder Association (ADDA): www.add.org ? National Institute of Mental Health (NIMH): www.nimh.nih.gov Contact a health care provider if:  Your symptoms are changing, getting worse, or not improving.  You have side effects from your medicine, such as: ? Repeated muscle twitches, coughing, or speech outbursts. ? Sleep problems. ? Loss of appetite. ? Depression. ? New or worsening behavior problems. ? Dizziness. ? Unusually fast heartbeat. ? Stomach pains. ? Headaches.  You are struggling with anxiety, depression, or substance abuse. Get help right away if:  You have a severe reaction to a medicine.  You have thoughts of hurting yourself or others. If you ever feel like you may hurt yourself or others, or have thoughts about taking your own life, get help right away. You can go to the nearest emergency department or call:  Your local emergency services (911 in the [place]  A suicide crisis helpline, such as the National Suicide Prevention Lifeline at 1-800-273-8255. This is open 24 hours a  day. Summary  ADHD is a mental health disorder that starts during childhood and often continues into adult years.  The exact cause of ADHD is not known.  There is no cure for ADHD, but treatment with medicine, therapy, or behavioral training can help you manage your condition. This information is not intended to replace advice given to you by your health care provider. Make sure you discuss any questions you have with your health care provider. Document Released: 05/30/2017 Document Revised: 09/20/2017 Document Reviewed: 05/30/2017 Elsevier Patient Education  2020 Elsevier Inc.  

## 2019-07-02 NOTE — Progress Notes (Signed)
SLEEP MEDICINE CLINIC    Provider:  Larey Seat, MD  Primary Care Physician:  Ria Bush, MD [address] [place] [address]     Referring Provider: Ria Bush, [address],  [place] [address]          Chief Complaint according to patient   Patient presents with:    . New Patient (Initial Visit)           HISTORY OF PRESENT ILLNESS:  Susan Soto is a 53 y.o. year old White or Caucasian female patient seen here  on 07/02/2019 from Dr Dionisio Paschal.   Chief concern according to patient : " pt states that she has difficulty with sleeping during the night. she has been doing that for several months. even if she goes to bed at normal time or if she sleeps 8-10 hrs she will still wake up tired. + snores. lasy SS over 15 yrs. She also reported " a hard time with brain fog " for which her Psychiatrist , Dr Stormy Fabian, has taken her out of work- FMLA for over 2 month? . She believes brain fog and ADHD started with breast cancer chemotherapy.  She reports having an easy case load and still unable to do her job.       I have the pleasure of seeing Susan Soto today, a right-handed White or Caucasian female with a possible sleep disorder.  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    Sleep relevant medical history: it is difficult to get her history and symptoms in an arranged time frame. She has been on ADD meds. She is fatigued all her life, andmany  medications have caused drowsiness when labeled as not drowsy formula. Irritability, cussing, anger, depression, anxiety.  She was diagnosed with hypothyroidism, and low coeruloplasmin, copper / zinc imbalance ( naturopath /)  Still sees psychiatrist  Donnel Saxon., MD   Family medical Rachelle Hora history: Mother is only family member with OSA, insomnia,  brother is a  sleep walkers. " everybody has" bruxism. Brothers all with ADHD, mom had ADD.    Social history: patient worked in [employer], elder care, [employer] graduate and Masters in social work degree and in fine arts/ fashion.   Patient is currently on FMLA , but usually working at [employer], and lives in a household with 2 persons- but her son Susan Soto, had to return home e with his fiancee during [place]. 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 Husband is a Ship broker.  Family status is married , with 2 adult children.  The patient currently works daytime.  Tobacco use; none .  ETOH use ; none. Caffeine intake in form of Coffee( yes- 2 mugs  in AM )  Soda( none ) Tea ( some afternoons) or energy drinks.    Sleep habits are as follows: The patient's lunch time is 2-3 PM dinner time is between 8.30- 9 PM. The patient goes to bed at  12 PM - 2 AM, and continues to sleeping until 10-11 AM , wakes rarely  bathroom breaks.   The preferred sleep position is prone or side , with the support of my- pillow.  Dreams are reportedly frequent/vivid .  Before FMLA 8 AM was the usual rise time. She was always late, The patient wakes up with an alarm.  She hits the snooze button.  She reports not feeling refreshed or restored in AM, with symptoms such as dry mouth , no morning headaches, but always with residual fatigue.  Naps are taken infrequently, she struggles to not fall asleep.     Review of Systems: Out of a complete 14 system review, the patient complains of only the following symptoms, and all other reviewed systems are negative.:    Unfocussed, not -concentrated, disorganized, procrastination. Growing anxiety. Fatigue, sleepiness delayed sleep cycle, sleep deprivation.        How likely are you to doze in the following situations: 0 = not likely, 1 = slight chance, 2 = moderate chance, 3 = high chance   Sitting and Reading? Watching Television? Sitting inactive in a public place (theater or meeting)? As a passenger in a car for an hour without a break? Lying down in the afternoon when circumstances permit? Sitting and talking to someone? Sitting quietly after lunch without alcohol? In a car, while stopped for a few minutes in traffic?   Total = 10/ 24 points   FSS endorsed at 60/ 63 points.   Social History   Socioeconomic History  . Marital status: Married    Spouse name: Not on file  . Number of children: Not on file  . Years of education: Not on file  . Highest education level: Not on file  Occupational History  . Occupation: Journalist, newspaper  . Financial resource strain: Not on file  . Food insecurity    Worry: Not on file    Inability: Not on file  . Transportation needs    Medical: Not on file    Non-medical: Not on file  Tobacco Use  . Smoking status: Never Smoker  . Smokeless tobacco: Never Used  Substance and Sexual Activity  . Alcohol use: No  . Drug use: No  . Sexual activity: Not on file  Lifestyle  . Physical activity    Days per week: Not on file    Minutes per session: Not on file  . Stress: Not on file  Relationships  . Social Herbalist on phone: Not on file    Gets together: Not on file    Attends religious service: Not on file    Active member of club or organization: Not on file    Attends meetings of clubs or organizations: Not on file    Relationship status: Not on file  Other Topics Concern  . Not on file  Social History Narrative   Caffeine: 1 cup coffee in am   Lives with husband and 1 son, daughter in college, 1 dog and 1 cat, 5-6  outside cats   Occupation: Camera operator job   Activity: no regular activity   Diet: joined [employer], good water, fruits/vegetables daily, red meat 3x/wk, fish 2x/wk      Psych- Dr. Wallis Mart in [place] (ph [phone])    Family History  Problem Relation Age of Onset  . Drug abuse Father        Heroin  . Hypertension Mother   . Fibromyalgia Mother   . ADD / ADHD Mother   . Depression Mother   .  Cancer Mother        cervical, bladder, non melanoma skin  . Colon polyps Mother        Hyperplastic  . ADD / ADHD Brother   . Anxiety disorder Brother   . Stroke Maternal Grandmother   . Breast cancer Maternal Grandmother        50's  . Hypertension Paternal Grandmother   . Cancer Paternal Grandfather        lung  . Hypothyroidism Maternal Aunt   . Cancer Maternal Aunt        vulvar, cervical, non melanoma skin  . Hypothyroidism Maternal Uncle   . Cancer Maternal Aunt        cervical  . Diabetes Neg Hx   . Coronary artery disease Neg Hx     Past Medical History:  Diagnosis Date  . ADD (attention deficit disorder) 1990s  . Anxiety   . BRCA negative 05/20/2017   MyRisk neg  . 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   . Genetic testing of female 05/2017  . Heart murmur   . Hemorrhoids   . History of breast cancer 2009   Left side, lumpectomy, s/p sentinal LN biopsy, chemo and radiation, remission  . 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 T1c, N0  . Personal history of chemotherapy 2009   left breast ca  . Personal history of radiation therapy 2009   left breast ca  . Subclinical hypothyroidism   . Syncope and collapse     Past Surgical History:  Procedure Laterality Date  . bartholin's cystectomy    . BREAST BIOPSY Left 05/07/08   Left (infitr ductal CA)- Dr. Bary Castilla  . BREAST  BIOPSY Left 06/25/13   left (benign) atrophic ductal epithelium, microcalcifications, changes of previous surgery.  Marland Kitchen BREAST LUMPECTOMY Left 05/27/2008   invasive ductal carcinoma  . breast wide excision  05/27/08   Left-with sentinel node bx (Dr. Bary Castilla)  . COLONOSCOPY  07/2009   WNL ([place])  . CYSTOSCOPY    . episiotomy       Current Outpatient Medications on File Prior to Visit  Medication Sig Dispense Refill  . Cholecalciferol (VITAMIN D-3) 5000 UNITS TABS Take 2 tablets by mouth daily.     . Cyanocobalamin (B-12) 1000 MCG SUBL Place 1 tablet under the tongue daily. 30 each   . dextroamphetamine (DEXTROSTAT) 10 MG tablet Take 40 mg by mouth. TAKE 3 TABLETS (40MG) ONCE DAILY AT LUNCHTIME.  0  . diazepam (VALIUM) 2 MG tablet Take 2 mg by mouth every 12 (twelve) hours as needed for anxiety (bladder retention).     Mariane Baumgarten Calcium (STOOL SOFTENER PO) Take by mouth as needed.    Marland Kitchen levothyroxine (SYNTHROID) 50 MCG tablet Take 1 tablet (50 mcg total) by mouth daily. 90 tablet 3  . medium chain triglycerides (MCT OIL) oil Take by mouth 3 (three) times daily. Takes 3 TBSP daily    . Probiotic Product (PROBIOTIC DAILY PO) Take by mouth daily.    Marland Kitchen pyridoxine (B-6) 100 MG tablet Take 100 mg by mouth daily.    . valACYclovir (VALTREX) 1000 MG tablet Take 2 tablets (2,000 mg total) by mouth 2 (two) times daily. x1 day 20 tablet 1  . zinc gluconate 50 MG tablet Take 50 mg by mouth daily.     No current facility-administered  medications on file prior to visit.     Allergies  Allergen Reactions  . Bupropion Hcl     REACTION: Hives  . Buspar [Buspirone Hcl] Nausea And Vomiting  . Other Other (See Comments)    Dissolving sutures Surgical glue bandaids   . Strattera [Atomoxetine Hcl] Other (See Comments)    Urinary trouble  . Tegaderm Ag Mesh [Silver]     Physical exam:  Today's Vitals   07/02/19 1312  BP: 132/88  Pulse: 89  Temp: 98 F (36.7 C)  Weight: 172 lb (78 kg)  Height: 5'  5.5" (1.664 m)   Body mass index is 28.19 kg/m.   Wt Readings from Last 3 Encounters:  07/02/19 172 lb (78 kg)  06/11/19 171 lb 9 oz (77.8 kg)  03/04/19 170 lb 6.4 oz (77.3 kg)     Ht Readings from Last 3 Encounters:  07/02/19 5' 5.5" (1.664 m)  06/11/19 5' 5.5" (1.664 m)  11/10/18 5' 5.5" (1.664 m)      General: The patient is awake, alert and appears in acute distress. The patient is well groomed. Head: Normocephalic, atraumatic.  Neck is supple. Mallampati 1  neck circumference: 14  inches . Nasal airflow  patent.  Retrognathia is not seen.  Dental status: intact, cheek biting marks.  Cardiovascular:  Regular rate and cardiac rhythm by pulse, without distended neck veins. Respiratory: Lungs are clear to auscultation.  Skin:  Without evidence of ankle edema, or rash. Trunk: The patient's posture is erect.   Neurologic exam : The patient is awake and alert, oriented to place and time.   Memory subjective described as severely impaired, inattentive.  Attention span & concentration ability appears impaired Speech is fluent,  without  dysarthria, dysphonia or aphasia.  Mood and affect are " pressured"   Cranial nerves: no loss of smell or taste reported  Pupils are equal and briskly reactive to light. Funduscopic exam deferred. .  Extraocular movements in vertical and horizontal planes were intact and without nystagmus. No Diplopia. Visual fields by finger perimetry are intact. Hearing was intact to soft voice and finger rubbing.    Facial sensation intact to fine touch.  Facial motor strength is symmetric and tongue and uvula move midline.  Neck ROM : rotation, tilt and flexion extension were normal for age and shoulder shrug was symmetrical.    Motor exam:  Symmetric bulk, tone and ROM.   Normal tone without cog wheeling, symmetric grip strength . She reports feeling less strength on the right.    Sensory:  Fine touch, pinprick and vibration were tested  and  normal.   Proprioception tested in the upper extremities was normal.   Coordination: Rapid alternating movements in the fingers/hands were of normal speed.  The Finger-to-nose maneuver was intact without evidence of ataxia, dysmetria or tremor.   Gait and station: Patient could rise unassisted from a seated position, walked without assistive device.  Stance is of normal width/ base and the patient turned with 3 steps.  Toe and heel walk were deferred.  Deep tendon reflexes: in the  upper and lower extremities are symmetric and intact.  Babinski response was normal.     After spending a total time of 45 minutes face to face and additional time for physical and neurologic examination, review of laboratory studies,  personal review of imaging studies, reports and results of other testing and review of referral information / records as far as provided in visit, I have established the following  assessments:  1)  The patient presents with a long standing excessive fatigue, may be depression/ anxiety related.  2)  She has ADD- and there is some pressured speech and reportedly some anger problems, she feels stressed and reacts with being paralyzed, can't get things done, feels always overwhelmed. There are other household members that contribute, Cody's return to the parental home, and her husbands hoarding.   3) she wake snorting and used to be heavier , and snored then.  She lost weight in a weight watcher diet. I wouldn't consider this extreme dieting but she is under the impression it affected her brain health.    My Plan is to proceed with:  1) Attended sleep study with EEG montage. Anxiety and panic attacks at night , may be dream related.  2) delayed sleep phase syndrome, light therapy AM for 30 days and Melatonin At night.  She may consider to follow her idea to sleep in a tend for a while - this would certainly stimulate a biological circadian entrainment.  3) I suspect carpal tunnel on the right,  NCV and EMG. 4) MRI brain - rule out organic changes, dementia,   I would like to thank Ria Bush, MD and Ria Bush, [address],  [place] [address] for allowing me to meet with and to take care of this pleasant patient.   In short, ADILYNNE FITZWATER is presenting with many symptoms that can be most likeley related to psychiatric underlying conditions. I suspect that her ADD is in effect not treated , not controlled. It doesn't help with concentration or focus.  I have heard of brain fog after chemo therapy, but not with delay.    I plan to follow up either personally or through our NP within 2 month. MOCA.  CC: I will share my notes with  Psychiatry  Electronically signed by: Larey Seat, MD 07/02/2019 1:23 PM  [facility] Sleep Board certified by The [employer] of Sleep Medicine and Diplomate of the [employer] of Sleep Medicine. Board certified In Neurology through the [place], Fellow of the [employer] of Neurology. Medical Director of [employer].

## 2019-07-02 NOTE — Telephone Encounter (Signed)
[employer] order sent to GI. They will obtain the auth and reach out to the patient to schedule.  

## 2019-07-03 LAB — COMPREHENSIVE METABOLIC PANEL
ALT: 16 IU/L (ref 0–32)
AST: 16 IU/L (ref 0–40)
Albumin/Globulin Ratio: 2.4 — ABNORMAL HIGH (ref 1.2–2.2)
Albumin: 4.5 g/dL (ref 3.8–4.9)
Alkaline Phosphatase: 48 IU/L (ref 39–117)
BUN/Creatinine Ratio: 21 (ref 9–23)
BUN: 14 mg/dL (ref 6–24)
Bilirubin Total: 0.2 mg/dL (ref 0.0–1.2)
CO2: 26 mmol/L (ref 20–29)
Calcium: 9 mg/dL (ref 8.7–10.2)
Chloride: 103 mmol/L (ref 96–106)
Creatinine, Ser: 0.66 mg/dL (ref 0.57–1.00)
GFR calc Af Amer: 117 mL/min/{1.73_m2} (ref >59)
GFR calc non Af Amer: 101 mL/min/{1.73_m2} (ref >59)
Globulin, Total: 1.9 g/dL (ref 1.5–4.5)
Glucose: 96 mg/dL (ref 65–99)
Potassium: 4 mmol/L (ref 3.5–5.2)
Sodium: 140 mmol/L (ref 134–144)
Total Protein: 6.4 g/dL (ref 6.0–8.5)

## 2019-07-04 NOTE — Addendum Note (Signed)
Addended by: Larey Seat on: 07/04/2019 12:23 PM   Modules accepted: Orders

## 2019-07-06 ENCOUNTER — Encounter: Payer: Self-pay | Admitting: Neurology

## 2019-07-17 ENCOUNTER — Other Ambulatory Visit: Payer: Self-pay

## 2019-07-17 ENCOUNTER — Ambulatory Visit
Admission: RE | Admit: 2019-07-17 | Discharge: 2019-07-17 | Disposition: A | Discharge status: Home / Self Care | Payer: Managed Care, Other (non HMO) | Source: Ambulatory Visit | Attending: Neurology | Admitting: Neurology

## 2019-07-17 DIAGNOSIS — R5382 Chronic fatigue, unspecified: Secondary | ICD-10-CM | POA: Diagnosis not present

## 2019-07-17 DIAGNOSIS — E038 Other specified hypothyroidism: Secondary | ICD-10-CM

## 2019-07-17 DIAGNOSIS — E039 Hypothyroidism, unspecified: Secondary | ICD-10-CM

## 2019-07-17 DIAGNOSIS — Z853 Personal history of malignant neoplasm of breast: Secondary | ICD-10-CM | POA: Diagnosis not present

## 2019-07-17 DIAGNOSIS — F419 Anxiety disorder, unspecified: Secondary | ICD-10-CM

## 2019-07-17 DIAGNOSIS — I493 Ventricular premature depolarization: Secondary | ICD-10-CM

## 2019-07-17 DIAGNOSIS — F900 Attention-deficit hyperactivity disorder, predominantly inattentive type: Secondary | ICD-10-CM

## 2019-07-17 MED ORDER — GADOBENATE DIMEGLUMINE 529 MG/ML IV SOLN
16.0000 mL | Freq: Once | INTRAVENOUS | Status: AC | PRN
  Administered 2019-07-17: 16 mL via INTRAVENOUS

## 2019-07-21 ENCOUNTER — Encounter: Payer: Self-pay | Admitting: Neurology

## 2019-07-21 NOTE — Telephone Encounter (Signed)
Novella RobJK:[id number] (exp. 07/15/19 to 01/11/20) patient had MRI at GI on 07/17/19.

## 2019-07-22 ENCOUNTER — Encounter: Payer: Self-pay | Admitting: Neurology

## 2019-07-23 ENCOUNTER — Ambulatory Visit
Admission: RE | Admit: 2019-07-23 | Discharge: 2019-07-23 | Disposition: A | Discharge status: Home / Self Care | Payer: Managed Care, Other (non HMO) | Source: Ambulatory Visit | Attending: Surgery | Admitting: Surgery

## 2019-07-23 DIAGNOSIS — Z1231 Encounter for screening mammogram for malignant neoplasm of breast: Secondary | ICD-10-CM | POA: Diagnosis not present

## 2019-07-26 ENCOUNTER — Ambulatory Visit (INDEPENDENT_AMBULATORY_CARE_PROVIDER_SITE_OTHER): Payer: Managed Care, Other (non HMO) | Admitting: Neurology

## 2019-07-26 DIAGNOSIS — F900 Attention-deficit hyperactivity disorder, predominantly inattentive type: Secondary | ICD-10-CM

## 2019-07-26 DIAGNOSIS — E039 Hypothyroidism, unspecified: Secondary | ICD-10-CM

## 2019-07-26 DIAGNOSIS — I493 Ventricular premature depolarization: Secondary | ICD-10-CM

## 2019-07-26 DIAGNOSIS — E038 Other specified hypothyroidism: Secondary | ICD-10-CM

## 2019-07-26 DIAGNOSIS — Z853 Personal history of malignant neoplasm of breast: Secondary | ICD-10-CM

## 2019-07-26 DIAGNOSIS — G4710 Hypersomnia, unspecified: Secondary | ICD-10-CM | POA: Diagnosis not present

## 2019-07-26 DIAGNOSIS — F419 Anxiety disorder, unspecified: Secondary | ICD-10-CM

## 2019-07-26 DIAGNOSIS — R5382 Chronic fatigue, unspecified: Secondary | ICD-10-CM

## 2019-07-29 ENCOUNTER — Ambulatory Visit: Payer: Managed Care, Other (non HMO) | Admitting: Surgery

## 2019-08-04 ENCOUNTER — Telehealth: Payer: Self-pay | Admitting: Neurology

## 2019-08-04 DIAGNOSIS — F900 Attention-deficit hyperactivity disorder, predominantly inattentive type: Secondary | ICD-10-CM

## 2019-08-04 DIAGNOSIS — F419 Anxiety disorder, unspecified: Secondary | ICD-10-CM

## 2019-08-04 DIAGNOSIS — R5382 Chronic fatigue, unspecified: Secondary | ICD-10-CM

## 2019-08-04 NOTE — Telephone Encounter (Signed)
Order replaced for the patient

## 2019-08-04 NOTE — Telephone Encounter (Signed)
Susan Soto can you re- order referral please Dr. Ferne Coe office requesting . Because the referral was closed buy a mistake and she can't schedule .[id number] - Ambulatory referral to Neuropsychology

## 2019-08-04 NOTE — Addendum Note (Signed)
Addended by: Darleen Crocker on: 08/04/2019 03:48 PM   Modules accepted: Orders

## 2019-08-05 ENCOUNTER — Encounter: Payer: Self-pay | Admitting: Psychology

## 2019-08-05 NOTE — Telephone Encounter (Signed)
Thanks Myriam Jacobson Order sent .

## 2019-08-10 ENCOUNTER — Telehealth: Payer: Self-pay | Admitting: Neurology

## 2019-08-10 ENCOUNTER — Ambulatory Visit (INDEPENDENT_AMBULATORY_CARE_PROVIDER_SITE_OTHER): Payer: Managed Care, Other (non HMO) | Admitting: Neurology

## 2019-08-10 ENCOUNTER — Ambulatory Visit (INDEPENDENT_AMBULATORY_CARE_PROVIDER_SITE_OTHER): Payer: Self-pay | Admitting: Neurology

## 2019-08-10 ENCOUNTER — Encounter: Payer: Self-pay | Admitting: Neurology

## 2019-08-10 ENCOUNTER — Other Ambulatory Visit: Payer: Self-pay

## 2019-08-10 DIAGNOSIS — F900 Attention-deficit hyperactivity disorder, predominantly inattentive type: Secondary | ICD-10-CM

## 2019-08-10 DIAGNOSIS — E039 Hypothyroidism, unspecified: Secondary | ICD-10-CM

## 2019-08-10 DIAGNOSIS — G5603 Carpal tunnel syndrome, bilateral upper limbs: Secondary | ICD-10-CM

## 2019-08-10 DIAGNOSIS — E038 Other specified hypothyroidism: Secondary | ICD-10-CM

## 2019-08-10 DIAGNOSIS — I493 Ventricular premature depolarization: Secondary | ICD-10-CM

## 2019-08-10 DIAGNOSIS — R5382 Chronic fatigue, unspecified: Secondary | ICD-10-CM

## 2019-08-10 DIAGNOSIS — Z853 Personal history of malignant neoplasm of breast: Secondary | ICD-10-CM

## 2019-08-10 DIAGNOSIS — F419 Anxiety disorder, unspecified: Secondary | ICD-10-CM

## 2019-08-10 HISTORY — DX: Carpal tunnel syndrome, bilateral upper limbs: G56.03

## 2019-08-10 NOTE — Progress Notes (Signed)
[facility]    Nerve / Sites Muscle Latency Ref. Amplitude Ref. Rel Amp Segments Distance Velocity Ref. Area    ms ms mV mV %  cm m/s m/s mVms  R Median - APB     Wrist APB 6.2 ?4.4 4.8 ?4.0 100 Wrist - APB 7   16.8     Upper arm APB 10.1  4.4  91.6 Upper arm - Wrist 20 52 ?49 15.0  L Median - APB     Wrist APB 5.4 ?4.4 6.6 ?4.0 100 Wrist - APB 7   21.7     Upper arm APB 9.3  6.7  102 Upper arm - Wrist 20 51 ?49 22.4  R Ulnar - ADM     Wrist ADM 2.6 ?3.3 13.3 ?6.0 100 Wrist - ADM 7   46.8     B.Elbow ADM 5.4  12.9  96.9 B.Elbow - Wrist 16 57 ?49 46.4     A.Elbow ADM 7.1  13.0  101 A.Elbow - B.Elbow 10 56 ?49 46.1         A.Elbow - Wrist      L Ulnar - ADM     Wrist ADM 2.6 ?3.3 14.1 ?6.0 100 Wrist - ADM 7   48.8     B.Elbow ADM 5.3  13.3  94.2 B.Elbow - Wrist 16 60 ?49 45.9     A.Elbow ADM 6.9  13.4  101 A.Elbow - B.Elbow 10 60 ?49 47.1         A.Elbow - Wrist                 SNC    Nerve / Sites Rec. Site Peak Lat Ref.  Amp Ref. Segments Distance    ms ms V V  cm  R Median - Orthodromic (Dig II, Mid palm)     Dig II Wrist 5.3 ?3.4 4 ?10 Dig II - Wrist 13  L Median - Orthodromic (Dig II, Mid palm)     Dig II Wrist 4.8 ?3.4 5 ?10 Dig II - Wrist 13  R Ulnar - Orthodromic, (Dig V, Mid palm)     Dig V Wrist 2.9 ?3.1 9 ?5 Dig V - Wrist 11  L Ulnar - Orthodromic, (Dig V, Mid palm)     Dig V Wrist 2.8 ?3.1 9 ?5 Dig V - Wrist 5              F  Wave    Nerve F Lat Ref.   ms ms  R Ulnar - ADM 25.3 ?32.0  L Ulnar - ADM 25.6 ?32.0

## 2019-08-10 NOTE — Progress Notes (Signed)
Please refer to EMG and nerve conduction procedure note.  

## 2019-08-10 NOTE — Telephone Encounter (Signed)
Called patient to discuss sleep study results. No answer at this time. LVM for the patient to call back.  I will also send a mychart message to the pt.

## 2019-08-10 NOTE — Telephone Encounter (Signed)
-----   Message from Larey Seat, MD sent at 08/10/2019  9:54 AM EDT ----- IMPRESSION: No organic sleep disorder could be identified. The sleep pattern was fragmented and sleep architecture was void of REM sleep. The lack of REM sleep and higher proportion of N3 sleep can be related to medication.   RECOMMENDATIONS: Unfortunately, this attended PSG with expanded EEG monitoring was unable to identify a reason for the patient's frequent arousals. Neither heart rate , not oxygen saturation, nor EEG abnormalities were noted, there was no OSA and no PLMs recorded.

## 2019-08-10 NOTE — Procedures (Signed)
PATIENT'S NAME:  Susan Soto, Susan Soto DOB:      04/02/1966      MRN:    [id number]     DATE OF RECORDING: 07/26/2019 REFERRING M.D.:  Ria Bush, MD   Study Performed:   Baseline Polysomnogram HISTORY:  Susan Soto is a 53 y.o. year old White or Caucasian female patient seen on 07/02/2019.   The stated that she has difficulty with sleeping during the night. She has been doing that for several months. even if she sleeps 8-10 hours she will still wake up tired. Last PSG was over 15 years ago. She also reported " brain- fog" for which her Psychiatrist has taken her out of work- [employer].   I have the pleasure of seeing Susan Soto today, a right-handed White or Caucasian female with a possible sleep disorder.  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  Sleep relevant medical history: it is difficult to get her history and symptoms in an arranged time frame. She has been on ADD meds. She is fatigued all her life, and many of her medications have caused drowsiness when labeled as not drowsy formula. Irritability, causing anger, worsening depression and anxiety.  She was diagnosed with hypothyroidism, and low coeruloplasmin, copper / zinc imbalance (naturopath /)  Still sees psychiatrist Luellen Pucker, MD. She believes brain fog and ADHD started with breast cancer chemotherapy. She reports having an easier case load and still feels unable to do her job  Family medical /sleep history: Mother is only family  member with OSA, insomnia, her brother is a sleep walker, and "everybody has" bruxism. Brothers all with ADHD, mom had ADD.   The patient endorsed the Epworth Sleepiness Scale at 10/24 points.   The patient's weight 172 pounds with a height of 66 (inches), resulting in a BMI of 28.3 kg/m2. The patient's neck circumference measured 14 inches.  CURRENT MEDICATIONS: Vitamin D-3, Vitamin B12, Detrostat, Valium, Synthroid, Probiotics, Valtrex.   PROCEDURE:  This is a multichannel digital polysomnogram utilizing the Somnostar 11.2 system.  Electrodes and sensors were applied and monitored per AASM Specifications.   EEG, EOG, Chin and Limb EMG, were sampled at 200 Hz.  ECG, Snore and Nasal Pressure, Thermal Airflow, Respiratory Effort, CPAP Flow and Pressure, Oximetry was sampled at 50 Hz. Digital video and audio were recorded.      BASELINE STUDY: Lights Out was at 22:36 and Lights On at 05:00.  Total recording time (TRT) was 384 minutes, with a total sleep time (TST) of 185.5 minutes.   The patient's sleep latency was 120.5 minutes.  REM latency was 0 minutes.  The sleep efficiency was poor at 48.3 %.     SLEEP ARCHITECTURE: WASO (Wake after sleep onset) was 140.5 minutes.  There were 7 minutes in Stage N1, 124.5 minutes Stage N2, 54 minutes Stage N3 and 0 minutes in Stage REM.  The percentage of Stage N1 was 3.8%, Stage N2 was 67.1%, Stage N3 was 29.1% and Stage R (REM sleep) was 0%.  RESPIRATORY ANALYSIS:  There were a total of 1 respiratory event:  0 apneas and 1 hypopnea with a  hypopnea index of .3 /hour.    The total APNEA/HYPOPNEA INDEX (AHI) was 0.3 /hour.  0 events occurred in REM sleep and 2 events in NREM. The REM AHI was 0 /hour, versus a non-REM AHI of 0.3. The patient spent 111.5 minutes of total sleep time in the supine position and 74 minutes in non-supine. The supine AHI was /h 0.5 versus a non-supine AHI of 0.0/h.  OXYGEN SATURATION & C02:  The Wake baseline 02 saturation was 88%, with  the lowest being 84%. Time spent below 89% saturation equaled 0 minutes.   AROUSALS:  The arousals were noted as: 29 were spontaneous, 0 were associated with PLMs, and 0 were associated with respiratory events. The patient had a total of 0 Periodic Limb Movements.   Audio and video analysis did not show any abnormal or unusual movements, complex behaviors, phonations or vocalizations.   Nocturia times one. Snoring was not noted. EKG in normal sinus rhythm (NSR).  IMPRESSION: No organic sleep disorder could be identified. The sleep pattern was fragmented and sleep architecture was void of REM sleep. The lack of REM sleep and higher proportion of N3 sleep can be related to medication.   RECOMMENDATIONS: Unfortunately, this attended PSG with expanded EEG monitoring was unable to identify a reason for the patient's frequent arousals. Neither heart rate , not oxygen saturation, nor EEG abnormalities were noted, there was no OSA and no PLMs recorded.    I certify that I have reviewed the entire raw data recording prior to the issuance of this report in accordance with the Standards of Accreditation of the American Academy of Sleep Medicine (AASM). I visit with the sleep clinic is not necessary, but we will follow the patient's wishes.    Larey Seat, MD   08-10-2019 Diplomat, American Board of Psychiatry and Neurology  Diplomat, [place] of [facility] Market researcher, [facility] [employer]

## 2019-08-10 NOTE — Procedures (Signed)
     HISTORY:  Susan Soto is a 53 year old patient with a history of discomfort in the hands that has developed since August 2020.  The patient does have some mild neck pain without radiation down the arms.  She is being evaluated for the above symptoms.  NERVE CONDUCTION STUDIES:  Nerve conduction studies were performed on both upper extremities.  The distal motor latencies for the median nerves were prolonged bilaterally with normal motor amplitudes for these nerves bilaterally.  The distal motor latencies and motor amplitudes for the ulnar nerves were normal bilaterally with normal nerve conduction velocities seen for the median and ulnar nerves bilaterally.  The sensory latencies for the median nerves were prolonged bilaterally, normal for the ulnar nerves bilaterally.  The F-wave latencies for the ulnar nerves were within normal limits bilaterally.  EMG STUDIES:  EMG study was performed on the left upper extremity:  The first dorsal interosseous muscle reveals 2 to 4 K units with full recruitment. No fibrillations or positive waves were noted. The abductor pollicis brevis muscle reveals 2 to 4 K units with full recruitment. No fibrillations or positive waves were noted. The extensor indicis proprius muscle reveals 1 to 3 K units with full recruitment. No fibrillations or positive waves were noted. The pronator teres muscle reveals 2 to 3 K units with full recruitment. No fibrillations or positive waves were noted. The biceps muscle reveals 1 to 2 K units with full recruitment. No fibrillations or positive waves were noted. The triceps muscle reveals 2 to 4 K units with full recruitment. No fibrillations or positive waves were noted. The anterior deltoid muscle reveals 2 to 3 K units with full recruitment. No fibrillations or positive waves were noted. The cervical paraspinal muscles were tested at 2 levels. No abnormalities of insertional activity were seen at either level tested. There  was good relaxation.   IMPRESSION:  Nerve conduction studies done on both upper extremities reveals evidence of mild bilateral carpal tunnel syndrome.  EMG evaluation of the left upper extremity was unremarkable, no evidence of an overlying cervical radiculopathy was seen.  Jill Alexanders MD 08/10/2019 3:29 PM  [facility] [address] [place], [place] [address]  Phone [phone] Fax [phone]

## 2019-08-13 ENCOUNTER — Telehealth: Payer: Self-pay | Admitting: *Deleted

## 2019-08-13 NOTE — Telephone Encounter (Signed)
LVM informing patient her NCS showed mild carpal tunnel syndrome confirmed. Advised her if a wrist brace has not given relief , she will need referral to hand surgeon. Requested she call back or send message with update on how she is doing.

## 2019-09-15 ENCOUNTER — Telehealth: Payer: Self-pay | Admitting: Family Medicine

## 2019-09-15 DIAGNOSIS — E038 Other specified hypothyroidism: Secondary | ICD-10-CM

## 2019-09-15 DIAGNOSIS — E559 Vitamin D deficiency, unspecified: Secondary | ICD-10-CM

## 2019-09-15 DIAGNOSIS — R5382 Chronic fatigue, unspecified: Secondary | ICD-10-CM

## 2019-09-15 DIAGNOSIS — E039 Hypothyroidism, unspecified: Secondary | ICD-10-CM

## 2019-09-15 DIAGNOSIS — Z13220 Encounter for screening for lipoid disorders: Secondary | ICD-10-CM

## 2019-09-15 DIAGNOSIS — R7989 Other specified abnormal findings of blood chemistry: Secondary | ICD-10-CM

## 2019-09-15 NOTE — Telephone Encounter (Signed)
Spoke with pt asking which [facility] she wants to use.  Requests order be sent to [facility] at [place], [place].

## 2019-09-15 NOTE — Telephone Encounter (Signed)
Patient called.  Patient has an appointment for a physical on 09/22/19 with Dr.G.  Patient gets her labs done at [facility].  Patient wants to make sure lab orders were sent to [facility] and which [facility] the orders sent to? Patient said the service center on [address] is closed.

## 2019-09-16 NOTE — Telephone Encounter (Signed)
Labs ordered to [facility]

## 2019-09-16 NOTE — Telephone Encounter (Signed)
Spoke with pt notifying her orders were sent.

## 2019-09-16 NOTE — Addendum Note (Signed)
Addended by: Ria Bush on: 09/16/2019 08:51 AM   Modules accepted: Orders

## 2019-09-22 ENCOUNTER — Other Ambulatory Visit: Payer: Self-pay

## 2019-09-22 ENCOUNTER — Ambulatory Visit (INDEPENDENT_AMBULATORY_CARE_PROVIDER_SITE_OTHER): Payer: Managed Care, Other (non HMO) | Admitting: Family Medicine

## 2019-09-22 ENCOUNTER — Encounter: Payer: Self-pay | Admitting: Family Medicine

## 2019-09-22 VITALS — BP 120/78 | HR 83 | Temp 98.0°F | Ht 65.0 in | Wt 174.2 lb

## 2019-09-22 DIAGNOSIS — Z1211 Encounter for screening for malignant neoplasm of colon: Secondary | ICD-10-CM

## 2019-09-22 DIAGNOSIS — F900 Attention-deficit hyperactivity disorder, predominantly inattentive type: Secondary | ICD-10-CM

## 2019-09-22 DIAGNOSIS — E039 Hypothyroidism, unspecified: Secondary | ICD-10-CM

## 2019-09-22 DIAGNOSIS — Z23 Encounter for immunization: Secondary | ICD-10-CM

## 2019-09-22 DIAGNOSIS — Z77120 Contact with and (suspected) exposure to mold (toxic): Secondary | ICD-10-CM

## 2019-09-22 DIAGNOSIS — F419 Anxiety disorder, unspecified: Secondary | ICD-10-CM

## 2019-09-22 DIAGNOSIS — E559 Vitamin D deficiency, unspecified: Secondary | ICD-10-CM | POA: Diagnosis not present

## 2019-09-22 DIAGNOSIS — Z0000 Encounter for general adult medical examination without abnormal findings: Secondary | ICD-10-CM | POA: Diagnosis not present

## 2019-09-22 DIAGNOSIS — C50412 Malignant neoplasm of upper-outer quadrant of left female breast: Secondary | ICD-10-CM

## 2019-09-22 DIAGNOSIS — Z171 Estrogen receptor negative status [ER-]: Secondary | ICD-10-CM

## 2019-09-22 DIAGNOSIS — R7989 Other specified abnormal findings of blood chemistry: Secondary | ICD-10-CM

## 2019-09-22 DIAGNOSIS — E038 Other specified hypothyroidism: Secondary | ICD-10-CM

## 2019-09-22 DIAGNOSIS — R5382 Chronic fatigue, unspecified: Secondary | ICD-10-CM

## 2019-09-22 DIAGNOSIS — Z13220 Encounter for screening for lipoid disorders: Secondary | ICD-10-CM

## 2019-09-22 MED ORDER — VALACYCLOVIR HCL 1 G PO TABS: 2000.0000 mg | ORAL_TABLET | Freq: Two times a day (BID) | ORAL | 1 refills | Status: DC

## 2019-09-22 NOTE — Assessment & Plan Note (Signed)
Preventative protocols reviewed and updated unless pt declined. Discussed healthy diet and lifestyle.  

## 2019-09-22 NOTE — Patient Instructions (Addendum)
Labs today - to [facility].  Flu shot today.  First shingrix today. Return in 2-6 months for nurse visit to complete series.  Good to see you today.  Return as needed or in 1 year for next physical, sooner if needed.   Health Maintenance for Postmenopausal Women Menopause is a normal process in which your ability to get pregnant comes to an end. This process happens slowly over many months or years, usually between the ages of 12 and 10. Menopause is complete when you have missed your menstrual periods for 12 months. It is important to talk with your health care provider about some of the most common conditions that affect women after menopause (postmenopausal women). These include heart disease, cancer, and bone loss (osteoporosis). Adopting a healthy lifestyle and getting preventive care can help to promote your health and wellness. The actions you take can also lower your chances of developing some of these common conditions. What should I know about menopause? During menopause, you may get a number of symptoms, such as:  Hot flashes. These can be moderate or severe.  Night sweats.  Decrease in sex drive.  Mood swings.  Headaches.  Tiredness.  Irritability.  Memory problems.  Insomnia. Choosing to treat or not to treat these symptoms is a decision that you make with your health care provider. Do I need hormone replacement therapy?  Hormone replacement therapy is effective in treating symptoms that are caused by menopause, such as hot flashes and night sweats.  Hormone replacement carries certain risks, especially as you become older. If you are thinking about using estrogen or estrogen with progestin, discuss the benefits and risks with your health care provider. What is my risk for heart disease and stroke? The risk of heart disease, heart attack, and stroke increases as you age. One of the causes may be a change in the body's hormones during menopause. This can affect how your  body uses dietary fats, triglycerides, and cholesterol. Heart attack and stroke are medical emergencies. There are many things that you can do to help prevent heart disease and stroke. Watch your blood pressure  High blood pressure causes heart disease and increases the risk of stroke. This is more likely to develop in people who have high blood pressure readings, are of African descent, or are overweight.  Have your blood pressure checked: ? Every 3-5 years if you are 66-62 years of age. ? Every year if you are 19 years old or older. Eat a healthy diet   Eat a diet that includes plenty of vegetables, fruits, low-fat dairy products, and lean protein.  Do not eat a lot of foods that are high in solid fats, added sugars, or sodium. Get regular exercise Get regular exercise. This is one of the most important things you can do for your health. Most adults should:  Try to exercise for at least 150 minutes each week. The exercise should increase your heart rate and make you sweat (moderate-intensity exercise).  Try to do strengthening exercises at least twice each week. Do these in addition to the moderate-intensity exercise.  Spend less time sitting. Even light physical activity can be beneficial. Other tips  Work with your health care provider to achieve or maintain a healthy weight.  Do not use any products that contain nicotine or tobacco, such as cigarettes, e-cigarettes, and chewing tobacco. If you need help quitting, ask your health care provider.  Know your numbers. Ask your health care provider to check your cholesterol and  your blood sugar (glucose). Continue to have your blood tested as directed by your health care provider. Do I need screening for cancer? Depending on your health history and family history, you may need to have cancer screening at different stages of your life. This may include screening for:  Breast cancer.  Cervical cancer.  Lung cancer.  Colorectal  cancer. What is my risk for osteoporosis? After menopause, you may be at increased risk for osteoporosis. Osteoporosis is a condition in which bone destruction happens more quickly than new bone creation. To help prevent osteoporosis or the bone fractures that can happen because of osteoporosis, you may take the following actions:  If you are 68-86 years old, get at least 1,000 mg of calcium and at least 600 mg of vitamin D per day.  If you are older than age 57 but younger than age 16, get at least 1,200 mg of calcium and at least 600 mg of vitamin D per day.  If you are older than age 21, get at least 1,200 mg of calcium and at least 800 mg of vitamin D per day. Smoking and drinking excessive alcohol increase the risk of osteoporosis. Eat foods that are rich in calcium and vitamin D, and do weight-bearing exercises several times each week as directed by your health care provider. How does menopause affect my mental health? Depression may occur at any age, but it is more common as you become older. Common symptoms of depression include:  Low or sad mood.  Changes in sleep patterns.  Changes in appetite or eating patterns.  Feeling an overall lack of motivation or enjoyment of activities that you previously enjoyed.  Frequent crying spells. Talk with your health care provider if you think that you are experiencing depression. General instructions See your health care provider for regular wellness exams and vaccines. This may include:  Scheduling regular health, dental, and eye exams.  Getting and maintaining your vaccines. These include: ? Influenza vaccine. Get this vaccine each year before the flu season begins. ? Pneumonia vaccine. ? Shingles vaccine. ? Tetanus, diphtheria, and pertussis (Tdap) booster vaccine. Your health care provider may also recommend other immunizations. Tell your health care provider if you have ever been abused or do not feel safe at  home. Summary  Menopause is a normal process in which your ability to get pregnant comes to an end.  This condition causes hot flashes, night sweats, decreased interest in sex, mood swings, headaches, or lack of sleep.  Treatment for this condition may include hormone replacement therapy.  Take actions to keep yourself healthy, including exercising regularly, eating a healthy diet, watching your weight, and checking your blood pressure and blood sugar levels.  Get screened for cancer and depression. Make sure that you are up to date with all your vaccines. This information is not intended to replace advice given to you by your health care provider. Make sure you discuss any questions you have with your health care provider. Document Released: 11/30/2005 Document Revised: 10/01/2018 Document Reviewed: 10/01/2018 Elsevier Patient Education  2020 [employer].

## 2019-09-22 NOTE — Progress Notes (Signed)
This visit was conducted in person.  BP 120/78 (BP Location: Right Arm, Patient Position: Sitting, Cuff Size: Large)   Pulse 83   Temp 98 F (36.7 C) (Temporal)   Ht 5\' 5"  (1.651 m)   Wt 174 lb 3 oz (79 kg)   LMP 03/04/2014   SpO2 96%   BMI 28.99 kg/m    CC: CPE Subjective:    Patient ID: Susan Soto, female    DOB: September 16, 1966, 53 y.o.   MRN: [id number]  HPI: Susan Soto is a 53 y.o. female presenting on 09/22/2019 for Annual Exam   H/o breast cancer released from onc care Susan Soto), still sees Dr Susan Soto yearly.   Requests valtrex refill PRN fever blisters. Requests ceruloplasmin checked - previously low. May get heavy metals checked through psych. Has had recent normal zinc and copper levels checked.   Has looked into awakening from alzheimer's series.  Continues FMLA through psych.  Struggling with CTS. Started on vitamin B6. Also takes turmeric and curcumin. Cold laser therapy through chiropractor has helped.   Sees chiropractor and homeopath and psychiatrist.  House tested recently, found black mold.   Preventative: Colon cancer screening - colonoscopy 2010 WNL (Susan Soto)  Well womanwith OBGYN- mother dx withcervical cancer40s. Pap smear normal 08/2018. Benign endometrial biopsy for post menopausal bleeding ~2018. Previously getting pap every year given family hx. Discussed guildelines, agrees to space out to Q2 yrs.  Mammogram normal 07/2019. H/o L breast cancer s/p radiation and chemotherapy 2009 - yearly mammograms, sees surgery yearly.  LMP - 02/2014. Postmenopausal  Fluyearly  Td 2003, Tdap 2013 Shingrix - discussed. Interested.  Seat belt use discussed. Sunscreen use discussed. No changing moles.  Non smoker. Significant 2nd hand smoke exposure.  Alcohol - none.  Dentist q6 mo Eye exam - yearly  Caffeine: 1 cup coffee in am Lives with husband and 1 son, daughter in college, 1 dog and 1 cat, 5-6 outside cats Occupation: Camera operator job  Activity: no regular activity Diet: joined [employer], good water, fruits/vegetables daily, red meat 3x/wk, fish 2x/wk, more stevia and less regular sugar. Switched from iodinated salt to sea salt.     Relevant past medical, surgical, family and social history reviewed and updated as indicated. Interim medical history since our last visit reviewed. Allergies and medications reviewed and updated. Outpatient Medications Prior to Visit  Medication Sig Dispense Refill  . ASHWAGANDHA PO Take 375 mg by mouth at bedtime.    . Cholecalciferol (VITAMIN D-3) 5000 UNITS TABS Take 2 tablets by mouth daily.     . COLLAGEN PO Take 1 tablet by mouth daily.    Marland Kitchen dextroamphetamine (DEXTROSTAT) 10 MG tablet Take 40 mg by mouth. TAKE 3 TABLETS (40MG ) ONCE DAILY AT LUNCHTIME.  0  . diazepam (VALIUM) 2 MG tablet Take 2 mg by mouth every 12 (twelve) hours as needed for anxiety (bladder retention).     Susan Soto Calcium (STOOL SOFTENER PO) Take by mouth as needed.    . lamoTRIgine (LAMICTAL) 25 MG tablet Take 25 mg by mouth daily. Takes 2 tablets once daily    . levothyroxine (SYNTHROID) 50 MCG tablet Take 1 tablet (50 mcg total) by mouth daily. 90 tablet 3  . medium chain triglycerides (MCT OIL) oil Take by mouth 3 (three) times daily. Takes 3 TBSP daily    . MILK THISTLE PO Take 1,000 mg by mouth.    . Multiple Vitamins-Minerals (ANTIOXIDANT FORMULA SG) capsule Take 1 capsule by mouth  daily. Powder daily    . NON FORMULARY Vitamins ADEK    . Omega-3 Fatty Acids (OMEGA 3 PO) Take 690 mg by mouth daily. Nordic naturals    . OVER THE COUNTER MEDICATION 2 capsules daily. KETO- metabolic ketosis support    . OVER THE COUNTER MEDICATION 1 tablet daily. MT Promoter Brain Science 350 mg    . Probiotic Product (PROBIOTIC DAILY PO) Take by mouth daily.    Marland Kitchen pyridoxine (B-6) 100 MG tablet Take 100 mg by mouth daily.    . TURMERIC PO Take by mouth daily. Takes 3 tablets once daily    . vitamin C (ASCORBIC ACID) 500  MG tablet Take 500 mg by mouth daily.    Marland Kitchen zinc gluconate 50 MG tablet Take 50 mg by mouth daily.    . valACYclovir (VALTREX) 1000 MG tablet Take 2 tablets (2,000 mg total) by mouth 2 (two) times daily. x1 day 20 tablet 1  . Cyanocobalamin (B-12) 1000 MCG SUBL Place 1 tablet under the tongue daily. 30 each    No facility-administered medications prior to visit.      Per HPI unless specifically indicated in ROS section below Review of Systems  Constitutional: Negative for activity change, appetite change, chills, fatigue, fever and unexpected weight change.  HENT: Negative for hearing loss.   Eyes: Negative for visual disturbance.  Respiratory: Negative for cough, chest tightness, shortness of breath and wheezing.   Cardiovascular: Negative for chest pain, palpitations and leg swelling.  Gastrointestinal: Positive for constipation (managed with stool softener and MCT oils). Negative for abdominal distention, abdominal pain, blood in stool, diarrhea, nausea and vomiting.  Genitourinary: Negative for difficulty urinating and hematuria.  Musculoskeletal: Negative for arthralgias, myalgias and neck pain.  Skin: Negative for rash.  Neurological: Negative for dizziness, seizures, syncope and headaches.  Hematological: Negative for adenopathy. Bruises/bleeds easily.  Psychiatric/Behavioral: Negative for dysphoric mood. The patient is nervous/anxious.    Objective:    BP 120/78 (BP Location: Right Arm, Patient Position: Sitting, Cuff Size: Large)   Pulse 83   Temp 98 F (36.7 C) (Temporal)   Ht 5\' 5"  (1.651 m)   Wt 174 lb 3 oz (79 kg)   LMP 03/04/2014   SpO2 96%   BMI 28.99 kg/m   Wt Readings from Last 3 Encounters:  09/22/19 174 lb 3 oz (79 kg)  07/02/19 172 lb (78 kg)  06/11/19 171 lb 9 oz (77.8 kg)    Physical Exam Vitals signs and nursing note reviewed.  Constitutional:      General: She is not in acute distress.    Appearance: Normal appearance. She is well-developed. She is  not ill-appearing.  HENT:     Head: Normocephalic and atraumatic.     Right Ear: Hearing, tympanic membrane, ear canal and external ear normal.     Left Ear: Hearing, tympanic membrane, ear canal and external ear normal.     Nose: Nose normal.     Mouth/Throat:     Mouth: Mucous membranes are moist.     Pharynx: Oropharynx is clear. Uvula midline. No posterior oropharyngeal erythema.  Eyes:     General: No scleral icterus.    Extraocular Movements: Extraocular movements intact.     Conjunctiva/sclera: Conjunctivae normal.     Pupils: Pupils are equal, round, and reactive to light.  Neck:     Musculoskeletal: Normal range of motion and neck supple.  Cardiovascular:     Rate and Rhythm: Normal rate and regular rhythm.  Pulses: Normal pulses.          Radial pulses are 2+ on the right side and 2+ on the left side.     Heart sounds: Normal heart sounds. No murmur.  Pulmonary:     Effort: Pulmonary effort is normal. No respiratory distress.     Breath sounds: Normal breath sounds. No wheezing, rhonchi or rales.  Abdominal:     General: Abdomen is flat. Bowel sounds are normal. There is no distension.     Palpations: Abdomen is soft. There is no mass.     Tenderness: There is no abdominal tenderness. There is no guarding or rebound.     Hernia: No hernia is present.  Musculoskeletal: Normal range of motion.     Right lower leg: No edema.     Left lower leg: No edema.  Lymphadenopathy:     Cervical: No cervical adenopathy.  Skin:    General: Skin is warm and dry.     Findings: No rash.  Neurological:     General: No focal deficit present.     Mental Status: She is alert and oriented to person, place, and time.     Comments: CN grossly intact, station and gait intact  Psychiatric:        Mood and Affect: Mood normal.        Behavior: Behavior normal.        Thought Content: Thought content normal.        Judgment: Judgment normal.       Results for orders placed or  performed in visit on 09/22/19  Lipid Profile  Result Value Ref Range   Cholesterol, Total 160 100 - 199 mg/dL   Triglycerides 117 0 - 149 mg/dL   HDL 64 >39 mg/dL   VLDL Cholesterol Cal 21 5 - 40 mg/dL   LDL Chol Calc (NIH) 75 0 - 99 mg/dL   Chol/HDL Ratio 2.5 0.0 - 4.4 ratio  CBC with Differential/Platelet  Result Value Ref Range   WBC 6.9 3.4 - 10.8 x10E3/uL   RBC 4.32 3.77 - 5.28 x10E6/uL   Hemoglobin 12.9 11.1 - 15.9 g/dL   Hematocrit 38.3 34.0 - 46.6 %   MCV 89 79 - 97 fL   MCH 29.9 26.6 - 33.0 pg   MCHC 33.7 31.5 - 35.7 g/dL   RDW 13.0 11.7 - 15.4 %   Platelets 297 150 - 450 x10E3/uL   Neutrophils 53 Not Estab. %   Lymphs 33 Not Estab. %   Monocytes 12 Not Estab. %   Eos 1 Not Estab. %   Basos 1 Not Estab. %   Neutrophils Absolute 3.7 1.4 - 7.0 x10E3/uL   Lymphocytes Absolute 2.3 0.7 - 3.1 x10E3/uL   Monocytes Absolute 0.8 0.1 - 0.9 x10E3/uL   EOS (ABSOLUTE) 0.1 0.0 - 0.4 x10E3/uL   Basophils Absolute 0.0 0.0 - 0.2 x10E3/uL   Immature Granulocytes 0 Not Estab. %   Immature Grans (Abs) 0.0 0.0 - 0.1 x10E3/uL  Comprehensive metabolic panel  Result Value Ref Range   Glucose 80 65 - 99 mg/dL   BUN 13 6 - 24 mg/dL   Creatinine, Ser 0.56 (L) 0.57 - 1.00 mg/dL   GFR calc non Af Amer 106 >59 mL/min/1.73   GFR calc Af Amer 122 >59 mL/min/1.73   BUN/Creatinine Ratio 23 9 - 23   Sodium 141 134 - 144 mmol/L   Potassium 4.4 3.5 - 5.2 mmol/L   Chloride 102 96 - 106 mmol/L  CO2 27 20 - 29 mmol/L   Calcium 9.9 8.7 - 10.2 mg/dL   Total Protein 6.9 6.0 - 8.5 g/dL   Albumin 4.3 3.8 - 4.9 g/dL   Globulin, Total 2.6 1.5 - 4.5 g/dL   Albumin/Globulin Ratio 1.7 1.2 - 2.2   Bilirubin Total 0.3 0.0 - 1.2 mg/dL   Alkaline Phosphatase 56 39 - 117 IU/L   AST 20 0 - 40 IU/L   ALT 27 0 - 32 IU/L  Ferritin  Result Value Ref Range   Ferritin 136 15 - 150 ng/mL  Iron and TIBC  Result Value Ref Range   Total Iron Binding Capacity 308 250 - 450 ug/dL   UIBC 209 131 - 425 ug/dL   Iron  99 27 - 159 ug/dL   Iron Saturation 32 15 - 55 %  TSH  Result Value Ref Range   TSH 3.260 0.450 - 4.500 uIU/mL  T4, free  Result Value Ref Range   Free T4 0.95 0.82 - 1.77 ng/dL  Vit D  25 hydroxy (rtn osteoporosis monitoring)  Result Value Ref Range   Vit D, 25-Hydroxy 34.4 30.0 - 100.0 ng/mL   Assessment & Plan:  This visit occurred during the SARS-CoV-2 public health emergency.  Safety protocols were in place, including screening questions prior to the visit, additional usage of staff PPE, and extensive cleaning of exam room while observing appropriate contact time as indicated for disinfecting solutions.   Problem List Items Addressed This Visit    Vitamin D deficiency    Update levels.      Relevant Orders   Vit D  25 hydroxy (rtn osteoporosis monitoring) (Completed)   Subclinical hypothyroidism    Update thyroid levels.       Relevant Orders   TSH (Completed)   T4, free (Completed)   Healthcare maintenance - Primary    Preventative protocols reviewed and updated unless pt declined. Discussed healthy diet and lifestyle.       Contact with or exposure to mold    House tested positive for black mold.       Chronic fatigue    Has felt B6 supplementation to significantly help. Continues other supplements as per med list.       Relevant Orders   CBC with Differential/Platelet (Completed)   Comprehensive metabolic panel (Completed)   TSH (Completed)   T4, free (Completed)   Ceruloplasmin   Carcinoma of upper-outer quadrant of left breast in female, estrogen receptor negative ([place])    Released from onc care, continues seeing gen surg for yearly breast exam.      Relevant Medications   valACYclovir (VALTREX) 1000 MG tablet   Attention deficit hyperactivity disorder (ADHD), predominantly inattentive type    Continue pysch f/u.      Anxiety disorder    Continue psych f/u.        Other Visit Diagnoses    Need for influenza vaccination       Relevant Orders    Flu Vaccine QUAD 36+ mos IM (Completed)   Special screening for malignant neoplasms, colon       Relevant Orders   Ambulatory referral to Gastroenterology   Need for shingles vaccine       Relevant Orders   Varicella-zoster vaccine IM (Completed)   Lipid screening       Relevant Orders   Lipid Profile (Completed)   Elevated ferritin       Relevant Orders   CBC with Differential/Platelet (Completed)   Ferritin (  Completed)   Iron and TIBC (Completed)       Meds ordered this encounter  Medications  . valACYclovir (VALTREX) 1000 MG tablet    Sig: Take 2 tablets (2,000 mg total) by mouth 2 (two) times daily. x1 day    Dispense:  20 tablet    Refill:  1   Orders Placed This Encounter  Procedures  . Flu Vaccine QUAD 36+ mos IM  . Varicella-zoster vaccine IM  . Lipid Profile  . CBC with Differential/Platelet  . Comprehensive metabolic panel  . Ferritin  . Iron and TIBC  . TSH  . T4, free  . Vit D  25 hydroxy (rtn osteoporosis monitoring)  . Ceruloplasmin  . Ambulatory referral to Gastroenterology    Referral Priority:   Routine    Referral Type:   Consultation    Referral Reason:   Specialty Services Required    Number of Visits Requested:   1    Patient instructions: Labs today - to [facility].  Flu shot today.  First shingrix today. Return in 2-6 months for nurse visit to complete series.  Good to see you today.  Return as needed or in 1 year for next physical, sooner if needed.   Follow up plan: Return in about 1 year (around 09/21/2020) for annual exam, prior fasting for blood work.  Ria Bush, MD

## 2019-09-23 ENCOUNTER — Telehealth: Payer: Self-pay | Admitting: Gastroenterology

## 2019-09-23 ENCOUNTER — Other Ambulatory Visit: Payer: Self-pay

## 2019-09-23 DIAGNOSIS — Z1211 Encounter for screening for malignant neoplasm of colon: Secondary | ICD-10-CM

## 2019-09-23 DIAGNOSIS — Z77120 Contact with and (suspected) exposure to mold (toxic): Secondary | ICD-10-CM | POA: Insufficient documentation

## 2019-09-23 LAB — CBC WITH DIFFERENTIAL/PLATELET
Basophils Absolute: 0 10*3/uL (ref 0.0–0.2)
Basos: 1 %
EOS (ABSOLUTE): 0.1 10*3/uL (ref 0.0–0.4)
Eos: 1 %
Hematocrit: 38.3 % (ref 34.0–46.6)
Hemoglobin: 12.9 g/dL (ref 11.1–15.9)
Immature Grans (Abs): 0 10*3/uL (ref 0.0–0.1)
Immature Granulocytes: 0 %
Lymphocytes Absolute: 2.3 10*3/uL (ref 0.7–3.1)
Lymphs: 33 %
MCH: 29.9 pg (ref 26.6–33.0)
MCHC: 33.7 g/dL (ref 31.5–35.7)
MCV: 89 fL (ref 79–97)
Monocytes Absolute: 0.8 10*3/uL (ref 0.1–0.9)
Monocytes: 12 %
Neutrophils Absolute: 3.7 10*3/uL (ref 1.4–7.0)
Neutrophils: 53 %
Platelets: 297 10*3/uL (ref 150–450)
RBC: 4.32 x10E6/uL (ref 3.77–5.28)
RDW: 13 % (ref 11.7–15.4)
WBC: 6.9 10*3/uL (ref 3.4–10.8)

## 2019-09-23 LAB — COMPREHENSIVE METABOLIC PANEL
ALT: 27 IU/L (ref 0–32)
AST: 20 IU/L (ref 0–40)
Albumin/Globulin Ratio: 1.7 (ref 1.2–2.2)
Albumin: 4.3 g/dL (ref 3.8–4.9)
Alkaline Phosphatase: 56 IU/L (ref 39–117)
BUN/Creatinine Ratio: 23 (ref 9–23)
BUN: 13 mg/dL (ref 6–24)
Bilirubin Total: 0.3 mg/dL (ref 0.0–1.2)
CO2: 27 mmol/L (ref 20–29)
Calcium: 9.9 mg/dL (ref 8.7–10.2)
Chloride: 102 mmol/L (ref 96–106)
Creatinine, Ser: 0.56 mg/dL — ABNORMAL LOW (ref 0.57–1.00)
GFR calc Af Amer: 122 mL/min/{1.73_m2} (ref >59)
GFR calc non Af Amer: 106 mL/min/{1.73_m2} (ref >59)
Globulin, Total: 2.6 g/dL (ref 1.5–4.5)
Glucose: 80 mg/dL (ref 65–99)
Potassium: 4.4 mmol/L (ref 3.5–5.2)
Sodium: 141 mmol/L (ref 134–144)
Total Protein: 6.9 g/dL (ref 6.0–8.5)

## 2019-09-23 LAB — TSH: TSH: 3.26 u[IU]/mL (ref 0.450–4.500)

## 2019-09-23 LAB — FERRITIN: Ferritin: 136 ng/mL (ref 15–150)

## 2019-09-23 LAB — IRON AND TIBC
Iron Saturation: 32 % (ref 15–55)
Iron: 99 ug/dL (ref 27–159)
Total Iron Binding Capacity: 308 ug/dL (ref 250–450)
UIBC: 209 ug/dL (ref 131–425)

## 2019-09-23 LAB — LIPID PANEL
Chol/HDL Ratio: 2.5 ratio (ref 0.0–4.4)
Cholesterol, Total: 160 mg/dL (ref 100–199)
HDL: 64 mg/dL (ref >39)
LDL Chol Calc (NIH): 75 mg/dL (ref 0–99)
Triglycerides: 117 mg/dL (ref 0–149)
VLDL Cholesterol Cal: 21 mg/dL (ref 5–40)

## 2019-09-23 LAB — T4, FREE: Free T4: 0.95 ng/dL (ref 0.82–1.77)

## 2019-09-23 LAB — VITAMIN D 25 HYDROXY (VIT D DEFICIENCY, FRACTURES): Vit D, 25-Hydroxy: 34.4 ng/mL (ref 30.0–100.0)

## 2019-09-23 NOTE — Assessment & Plan Note (Signed)
Has felt B6 supplementation to significantly help. Continues other supplements as per med list.

## 2019-09-23 NOTE — Assessment & Plan Note (Signed)
Update thyroid levels.

## 2019-09-23 NOTE — Assessment & Plan Note (Signed)
House tested positive for black mold.

## 2019-09-23 NOTE — Assessment & Plan Note (Signed)
Released from onc care, continues seeing gen surg for yearly breast exam.

## 2019-09-23 NOTE — Assessment & Plan Note (Signed)
Update levels. 

## 2019-09-23 NOTE — Telephone Encounter (Signed)
Pt left vm returning a call for [facility]

## 2019-09-23 NOTE — Assessment & Plan Note (Signed)
Continue pysch f/u.

## 2019-09-23 NOTE — Addendum Note (Signed)
Addended by: Cloyd Stagers on: 09/23/2019 04:06 PM   Modules accepted: Orders

## 2019-09-23 NOTE — Telephone Encounter (Signed)
Gastroenterology Pre-Procedure Review  Request Date: Tuesday 10/06/19 Requesting Physician: Dr. Allen Norris  PATIENT REVIEW QUESTIONS: The patient responded to the following health history questions as indicated:    1. Are you having any GI issues? pt states her usual constipation 2. Do you have a personal history of Polyps? no 3. Do you have a family history of Colon Cancer or Polyps? yes (mother and other family member had colon polyps) 4. Diabetes Mellitus? no 5. Joint replacements in the past 12 months?no 6. Major health problems in the past 3 months?no 7. Any artificial heart valves, MVP, or defibrillator?no    MEDICATIONS & ALLERGIES:    Patient reports the following regarding taking any anticoagulation/antiplatelet therapy:   Plavix, Coumadin, Eliquis, Xarelto, Lovenox, Pradaxa, Brilinta, or Effient? no Aspirin? no  Patient confirms/reports the following medications:  Current Outpatient Medications  Medication Sig Dispense Refill  . ASHWAGANDHA PO Take 375 mg by mouth at bedtime.    . Cholecalciferol (VITAMIN D-3) 5000 UNITS TABS Take 2 tablets by mouth daily.     . COLLAGEN PO Take 1 tablet by mouth daily.    Marland Kitchen dextroamphetamine (DEXTROSTAT) 10 MG tablet Take 40 mg by mouth. TAKE 3 TABLETS (40MG ) ONCE DAILY AT LUNCHTIME.  0  . diazepam (VALIUM) 2 MG tablet Take 2 mg by mouth every 12 (twelve) hours as needed for anxiety (bladder retention).     Mariane Baumgarten Calcium (STOOL SOFTENER PO) Take by mouth as needed.    . lamoTRIgine (LAMICTAL) 25 MG tablet Take 25 mg by mouth daily. Takes 2 tablets once daily    . levothyroxine (SYNTHROID) 50 MCG tablet Take 1 tablet (50 mcg total) by mouth daily. 90 tablet 3  . medium chain triglycerides (MCT OIL) oil Take by mouth 3 (three) times daily. Takes 3 TBSP daily    . MILK THISTLE PO Take 1,000 mg by mouth.    . Multiple Vitamins-Minerals (ANTIOXIDANT FORMULA SG) capsule Take 1 capsule by mouth daily. Powder daily    . NON FORMULARY Vitamins ADEK     . Omega-3 Fatty Acids (OMEGA 3 PO) Take 690 mg by mouth daily. Nordic naturals    . OVER THE COUNTER MEDICATION 2 capsules daily. KETO- metabolic ketosis support    . OVER THE COUNTER MEDICATION 1 tablet daily. MT Promoter Brain Science 350 mg    . Probiotic Product (PROBIOTIC DAILY PO) Take by mouth daily.    Marland Kitchen pyridoxine (B-6) 100 MG tablet Take 100 mg by mouth daily.    . TURMERIC PO Take by mouth daily. Takes 3 tablets once daily    . valACYclovir (VALTREX) 1000 MG tablet Take 2 tablets (2,000 mg total) by mouth 2 (two) times daily. x1 day 20 tablet 1  . vitamin C (ASCORBIC ACID) 500 MG tablet Take 500 mg by mouth daily.    Marland Kitchen zinc gluconate 50 MG tablet Take 50 mg by mouth daily.     No current facility-administered medications for this visit.     Patient confirms/reports the following allergies:  Allergies  Allergen Reactions  . Bupropion Hcl     REACTION: Hives  . Buspar [Buspirone Hcl] Nausea And Vomiting  . Other Other (See Comments)    Dissolving sutures Surgical glue bandaids   . Strattera [Atomoxetine Hcl] Other (See Comments)    Urinary trouble  . Tegaderm Ag Mesh [Silver]     No orders of the defined types were placed in this encounter.   AUTHORIZATION INFORMATION Primary Insurance: 1D#: Group #:  Secondary Insurance: 1D#: Group #:  SCHEDULE INFORMATION: Date: Tuesday 10/06/19 Time: Location:[facility]

## 2019-09-23 NOTE — Assessment & Plan Note (Signed)
Continue psych f/u 

## 2019-09-26 ENCOUNTER — Encounter: Payer: Self-pay | Admitting: Family Medicine

## 2019-09-28 LAB — CERULOPLASMIN: Ceruloplasmin: 25.3 mg/dL (ref 19.0–39.0)

## 2019-09-28 LAB — SPECIMEN STATUS REPORT

## 2019-10-02 ENCOUNTER — Other Ambulatory Visit
Admission: RE | Admit: 2019-10-02 | Discharge: 2019-10-02 | Disposition: A | Discharge status: Home / Self Care | Payer: 59 | Source: Ambulatory Visit | Attending: Gastroenterology | Admitting: Gastroenterology

## 2019-10-02 ENCOUNTER — Other Ambulatory Visit: Payer: Self-pay

## 2019-10-02 DIAGNOSIS — Z01812 Encounter for preprocedural laboratory examination: Secondary | ICD-10-CM | POA: Insufficient documentation

## 2019-10-02 DIAGNOSIS — Z20828 Contact with and (suspected) exposure to other viral communicable diseases: Secondary | ICD-10-CM | POA: Diagnosis not present

## 2019-10-03 LAB — SARS CORONAVIRUS 2 (TAT 6-24 HRS): SARS Coronavirus 2: NEGATIVE

## 2019-10-05 ENCOUNTER — Telehealth: Payer: Self-pay | Admitting: Gastroenterology

## 2019-10-05 NOTE — Telephone Encounter (Signed)
Pt left vm she has a scheduled procedure tomorrow  And has a questions regarding her instructions sheet and rx cb [phone] or cell on file.

## 2019-10-05 NOTE — Telephone Encounter (Signed)
Patient has been informed yes after completing her first and second bottle of Suprep she should drink 16 oz of water afterwards, and continue to drink 8 oz of water every hour nothing to eat or drink 4 hours prior to her procedure.  Thanks [employer]

## 2019-10-06 ENCOUNTER — Ambulatory Visit: Payer: Managed Care, Other (non HMO) | Admitting: Certified Registered Nurse Anesthetist

## 2019-10-06 ENCOUNTER — Encounter
Admission: RE | Disposition: A | Discharge status: Home / Self Care | Payer: Self-pay | Source: Home / Self Care | Attending: Gastroenterology

## 2019-10-06 ENCOUNTER — Ambulatory Visit
Admission: RE | Admit: 2019-10-06 | Discharge: 2019-10-06 | Disposition: A | Discharge status: Home / Self Care | Payer: Managed Care, Other (non HMO) | Attending: Gastroenterology | Admitting: Gastroenterology

## 2019-10-06 ENCOUNTER — Encounter: Payer: Self-pay | Admitting: Gastroenterology

## 2019-10-06 DIAGNOSIS — Z888 Allergy status to other drugs, medicaments and biological substances status: Secondary | ICD-10-CM | POA: Diagnosis not present

## 2019-10-06 DIAGNOSIS — Z8249 Family history of ischemic heart disease and other diseases of the circulatory system: Secondary | ICD-10-CM | POA: Insufficient documentation

## 2019-10-06 DIAGNOSIS — Z823 Family history of stroke: Secondary | ICD-10-CM | POA: Insufficient documentation

## 2019-10-06 DIAGNOSIS — Z8371 Family history of colonic polyps: Secondary | ICD-10-CM | POA: Diagnosis not present

## 2019-10-06 DIAGNOSIS — Z853 Personal history of malignant neoplasm of breast: Secondary | ICD-10-CM | POA: Diagnosis not present

## 2019-10-06 DIAGNOSIS — Z818 Family history of other mental and behavioral disorders: Secondary | ICD-10-CM | POA: Diagnosis not present

## 2019-10-06 DIAGNOSIS — Z803 Family history of malignant neoplasm of breast: Secondary | ICD-10-CM | POA: Diagnosis not present

## 2019-10-06 DIAGNOSIS — Z1211 Encounter for screening for malignant neoplasm of colon: Secondary | ICD-10-CM

## 2019-10-06 DIAGNOSIS — Z923 Personal history of irradiation: Secondary | ICD-10-CM | POA: Diagnosis not present

## 2019-10-06 DIAGNOSIS — E039 Hypothyroidism, unspecified: Secondary | ICD-10-CM | POA: Diagnosis not present

## 2019-10-06 DIAGNOSIS — Z801 Family history of malignant neoplasm of trachea, bronchus and lung: Secondary | ICD-10-CM | POA: Insufficient documentation

## 2019-10-06 DIAGNOSIS — F988 Other specified behavioral and emotional disorders with onset usually occurring in childhood and adolescence: Secondary | ICD-10-CM | POA: Insufficient documentation

## 2019-10-06 DIAGNOSIS — Z9221 Personal history of antineoplastic chemotherapy: Secondary | ICD-10-CM | POA: Insufficient documentation

## 2019-10-06 DIAGNOSIS — K640 First degree hemorrhoids: Secondary | ICD-10-CM | POA: Diagnosis not present

## 2019-10-06 DIAGNOSIS — Z79899 Other long term (current) drug therapy: Secondary | ICD-10-CM | POA: Diagnosis not present

## 2019-10-06 DIAGNOSIS — F419 Anxiety disorder, unspecified: Secondary | ICD-10-CM | POA: Insufficient documentation

## 2019-10-06 DIAGNOSIS — R0602 Shortness of breath: Secondary | ICD-10-CM | POA: Insufficient documentation

## 2019-10-06 DIAGNOSIS — Z8049 Family history of malignant neoplasm of other genital organs: Secondary | ICD-10-CM | POA: Insufficient documentation

## 2019-10-06 HISTORY — DX: Other specified postprocedural states: Z98.890

## 2019-10-06 HISTORY — PX: COLONOSCOPY WITH PROPOFOL: SHX5780

## 2019-10-06 HISTORY — DX: Nausea with vomiting, unspecified: R11.2

## 2019-10-06 SURGERY — COLONOSCOPY WITH PROPOFOL
Anesthesia: General

## 2019-10-06 MED ORDER — SODIUM CHLORIDE 0.9 % IV SOLN: INTRAVENOUS | Status: DC

## 2019-10-06 MED ORDER — PROPOFOL 500 MG/50ML IV EMUL
INTRAVENOUS | Status: AC
  Filled 2019-10-06: qty 50

## 2019-10-06 MED ORDER — PROPOFOL 500 MG/50ML IV EMUL
INTRAVENOUS | Status: DC | PRN
  Administered 2019-10-06: 130 ug/kg/min via INTRAVENOUS

## 2019-10-06 MED ORDER — LIDOCAINE HCL (CARDIAC) PF 100 MG/5ML IV SOSY
PREFILLED_SYRINGE | INTRAVENOUS | Status: DC | PRN
  Administered 2019-10-06: 50 mg via INTRAVENOUS

## 2019-10-06 MED ORDER — PROPOFOL 10 MG/ML IV BOLUS
INTRAVENOUS | Status: DC | PRN
  Administered 2019-10-06: 80 mg via INTRAVENOUS

## 2019-10-06 MED ORDER — ONDANSETRON HCL 4 MG/2ML IJ SOLN
INTRAMUSCULAR | Status: AC
  Filled 2019-10-06: qty 2

## 2019-10-06 MED ORDER — ONDANSETRON HCL 4 MG/2ML IJ SOLN
INTRAMUSCULAR | Status: DC | PRN
  Administered 2019-10-06: 4 mg via INTRAVENOUS

## 2019-10-06 NOTE — H&P (Signed)
Lucilla Lame, MD [facility] [address] [place], [place] [address] Phone: [phone] Fax : [phone]  Primary Care Physician:  Ria Bush, MD Primary Gastroenterologist:  Dr. Allen Norris  Pre-Procedure History & Physical: HPI:  Susan Soto is a 53 y.o. female is here for a screening colonoscopy.   Past Medical History:  Diagnosis Date  . ADD (attention deficit disorder) 1990s  . Anxiety   . Bilateral carpal tunnel syndrome 08/10/2019  . BRCA negative 05/20/2017   MyRisk neg  . 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   . Genetic testing of female 05/2017  . Hemorrhoids   . History of breast cancer 2009   Left side, lumpectomy, s/p sentinal LN biopsy, chemo and radiation, remission  . Malignant neoplasm of upper-outer quadrant of female breast ([place]) 05/27/2008   Left breast, 1.3 cm triple negative, node negative invasive mammary carcinoma. T1c, N0  . Personal history of chemotherapy 2009   left breast ca  . Personal history of radiation therapy 2009   left breast ca  . PONV (postoperative nausea and vomiting)   . Subclinical hypothyroidism   . Syncope and collapse     Past Surgical History:  Procedure Laterality Date  . bartholin's cystectomy    . BREAST BIOPSY Left 05/07/08   Left (infitr ductal CA)- Dr. Bary Castilla  . BREAST BIOPSY Left 06/25/13   left (benign) atrophic ductal epithelium, microcalcifications, changes of previous surgery.  Marland Kitchen BREAST LUMPECTOMY Left 05/27/2008   invasive ductal carcinoma  . breast wide excision  05/27/08   Left-with sentinel node bx (Dr. Bary Castilla)  . COLONOSCOPY  07/2009   WNL ([place])  . CYSTOSCOPY    . episiotomy      Prior to Admission medications   Medication Sig Start Date End Date Taking? Authorizing Provider  ASHWAGANDHA PO Take 375 mg by mouth at bedtime.   Yes [provider]  Cholecalciferol (VITAMIN D-3) 5000 UNITS TABS Take 2 tablets by mouth daily.     Yes [provider]  COLLAGEN PO Take 1 tablet by mouth daily.   Yes [provider]  dextroamphetamine (DEXTROSTAT) 10 MG tablet Take 40 mg by mouth. TAKE 3 TABLETS (40MG) ONCE DAILY AT LUNCHTIME. 06/27/16  Yes [provider]  diazepam (VALIUM) 2 MG tablet Take 2 mg by mouth every 12 (twelve) hours as needed for anxiety (bladder retention).    Yes [provider]  Docusate Calcium (STOOL SOFTENER PO) Take by mouth as needed.   Yes [provider]  lamoTRIgine (LAMICTAL) 25 MG tablet Take 25 mg by mouth daily. Takes 2 tablets once daily   Yes [provider]  levothyroxine (SYNTHROID) 50 MCG tablet Take 1 tablet (50 mcg total) by mouth daily. 03/04/19  Yes Shamleffer, Melanie Crazier, MD  medium chain triglycerides (MCT OIL) oil Take by mouth 3 (three) times daily. Takes 3 TBSP daily   Yes [provider]  MILK THISTLE PO Take 1,000 mg by mouth.   Yes [provider]  Multiple Vitamins-Minerals (ANTIOXIDANT FORMULA SG) capsule Take 1 capsule by mouth daily. Powder daily   Yes [provider]  NON FORMULARY Vitamins ADEK   Yes [provider]  Omega-3 Fatty Acids (OMEGA 3 PO) Take 690 mg by mouth daily. Nordic naturals   Yes [provider]  OVER THE COUNTER MEDICATION 2 capsules daily. KETO- metabolic ketosis support   Yes [provider]  OVER THE COUNTER  MEDICATION 1 tablet daily. MT Promoter Brain Science 350 mg   Yes [provider]  Probiotic Product (PROBIOTIC DAILY PO) Take by mouth daily.   Yes [provider]  pyridoxine (B-6) 100 MG tablet Take 100 mg by mouth daily.   Yes [provider]  TURMERIC PO Take by mouth daily. Takes 3 tablets once daily   Yes [provider]  valACYclovir (VALTREX) 1000 MG tablet Take 2 tablets (2,000 mg total) by mouth 2 (two) times daily. x1 day 09/22/19  Yes Ria Bush, MD  vitamin C (ASCORBIC ACID) 500 MG  tablet Take 500 mg by mouth daily.   Yes [provider]  zinc gluconate 50 MG tablet Take 50 mg by mouth daily.   Yes [provider]    Allergies as of 09/24/2019 - Review Complete 09/22/2019  Allergen Reaction Noted  . Bupropion hcl    . Buspar [buspirone hcl] Nausea And Vomiting 02/28/2011  . Other Other (See Comments) 06/11/2013  . Strattera [atomoxetine hcl] Other (See Comments) 02/26/2014  . Tegaderm ag mesh [silver]  07/24/2018    Family History  Problem Relation Age of Onset  . Drug abuse Father        Heroin  . Hypertension Mother   . Fibromyalgia Mother   . ADD / ADHD Mother   . Depression Mother   . Cancer Mother        cervical, bladder, non melanoma skin  . Colon polyps Mother        Hyperplastic  . ADD / ADHD Brother   . Anxiety disorder Brother   . Stroke Maternal Grandmother   . Breast cancer Maternal Grandmother        50's  . Hypertension Paternal Grandmother   . Cancer Paternal Grandfather        lung  . Hypothyroidism Maternal Aunt   . Cancer Maternal Aunt        vulvar, cervical, non melanoma skin  . Hypothyroidism Maternal Uncle   . Cancer Maternal Aunt        cervical, lung, precancerous colon polyps  . Diabetes Neg Hx   . Coronary artery disease Neg Hx     Social History   Socioeconomic History  . Marital status: Married    Spouse name: Not on file  . Number of children: Not on file  . Years of education: Not on file  . Highest education level: Not on file  Occupational History  . Occupation: Education officer, museum  Tobacco Use  . Smoking status: Never Smoker  . Smokeless tobacco: Never Used  Substance and Sexual Activity  . Alcohol use: No  . Drug use: No  . Sexual activity: Not on file  Other Topics Concern  . Not on file  Social History Narrative   Caffeine: 1 cup coffee in am   Lives with husband and 1 son, daughter in college, 1 dog and 1 cat, 5-6 outside cats   Occupation: Camera operator job   Activity: no  regular activity   Diet: joined [employer], good water, fruits/vegetables daily, red meat 3x/wk, fish 2x/wk      Psych- Dr. Wallis Mart in [place] (ph [phone])   Social Determinants of Health   Financial Resource Strain:   . Difficulty of Paying Living Expenses: Not on file  Food Insecurity:   . Worried About Charity fundraiser in the Last Year: Not on file  . Ran Out of Food in the Last Year: Not on file  Transportation Needs:   . Film/video editor (Medical): Not on file  . Lack of Transportation (Non-Medical): Not on file  Physical Activity:   . Days of Exercise per Week: Not on file  . Minutes of Exercise per Session: Not on file  Stress:   . Feeling of Stress : Not on file  Social Connections:   . Frequency of Communication with Friends and Family: Not on file  . Frequency of Social Gatherings with Friends and Family: Not on file  . Attends Religious Services: Not on file  . Active Member of Clubs or Organizations: Not on file  . Attends Archivist Meetings: Not on file  . Marital Status: Not on file  Intimate Partner Violence:   . Fear of Current or Ex-Partner: Not on file  . Emotionally Abused: Not on file  . Physically Abused: Not on file  . Sexually Abused: Not on file    Review of Systems: See HPI, otherwise negative ROS  Physical Exam: BP 119/71   Pulse (!) 59   Temp (!) 96.9 F (36.1 C) (Temporal)   Resp 18   Ht 5' 5"  (1.651 m)   Wt 78.6 kg   LMP 03/04/2014   SpO2 100%   BMI 28.85 kg/m  General:   Alert,  pleasant and cooperative in NAD Head:  Normocephalic and atraumatic. Neck:  Supple; no masses or thyromegaly. Lungs:  Clear throughout to auscultation.    Heart:  Regular rate and rhythm. Abdomen:  Soft, nontender and nondistended. Normal bowel sounds, without guarding, and without rebound.   Neurologic:  Alert and  oriented x4;  grossly normal neurologically.  Impression/Plan: Susan Soto is now here to undergo a  screening colonoscopy.  Risks, benefits, and alternatives regarding colonoscopy have been reviewed with the patient.  Questions have been answered.  All parties agreeable.

## 2019-10-06 NOTE — Anesthesia Preprocedure Evaluation (Signed)
Anesthesia Evaluation  Patient identified by MRN, date of birth, ID band Patient awake    Reviewed: Allergy & Precautions, NPO status , Patient's Chart, lab work & pertinent test results  History of Anesthesia Complications (+) PONV and history of anesthetic complications  Airway Mallampati: III       Dental   Pulmonary shortness of breath and with exertion,           Cardiovascular negative cardio ROS       Neuro/Psych PSYCHIATRIC DISORDERS Anxiety  Neuromuscular disease    GI/Hepatic Neg liver ROS,   Endo/Other  Hypothyroidism   Renal/GU negative Renal ROS  negative genitourinary   Musculoskeletal negative musculoskeletal ROS (+)   Abdominal   Peds negative pediatric ROS (+)  Hematology negative hematology ROS (+)   Anesthesia Other Findings Past Medical History: 1990s: ADD (attention deficit disorder) No date: Anxiety 08/10/2019: Bilateral carpal tunnel syndrome 05/20/2017: BRCA negative     Comment:  MyRisk neg 2009: Breast cancer ([facility])     Comment:  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  05/2017: Genetic testing of female No date: Hemorrhoids 2009: History of breast cancer     Comment:  Left side, lumpectomy, s/p sentinal LN biopsy, chemo and              radiation, remission 05/27/2008: Malignant neoplasm of upper-outer quadrant of female  breast ([facility])     Comment:  Left breast, 1.3 cm triple negative, node negative               invasive mammary carcinoma. T1c, N0 2009: Personal history of chemotherapy     Comment:  left breast ca 2009: Personal history of radiation therapy     Comment:  left breast ca No date: PONV (postoperative nausea and vomiting) No date: Subclinical hypothyroidism No date: Syncope and collapse  Reproductive/Obstetrics                             Anesthesia Physical Anesthesia Plan  ASA:  II  Anesthesia Plan: General   Post-op Pain Management:    Induction: Intravenous  PONV Risk Score and Plan: Propofol infusion  Airway Management Planned: Nasal Cannula  Additional Equipment:   Intra-op Plan:   Post-operative Plan:   Informed Consent: I have reviewed the patients History and Physical, chart, labs and discussed the procedure including the risks, benefits and alternatives for the proposed anesthesia with the patient or authorized representative who has indicated his/her understanding and acceptance.     Dental advisory given  Plan Discussed with: CRNA and Surgeon  Anesthesia Plan Comments:         Anesthesia Quick Evaluation  

## 2019-10-06 NOTE — Transfer of Care (Signed)
Immediate Anesthesia Transfer of Care Note  Patient: Susan Soto  Procedure(s) Performed: COLONOSCOPY WITH PROPOFOL (N/A )  Patient Location: PACU and Endoscopy Unit  Anesthesia Type:General  Level of Consciousness: drowsy  Airway & Oxygen Therapy: Patient Spontanous Breathing  Post-op Assessment: Report given to RN and Post -op Vital signs reviewed and stable  Post vital signs: Reviewed and stable  Last Vitals:  Vitals Value Taken Time  BP 89/51 10/06/19 1028  Temp    Pulse 71 10/06/19 1029  Resp 17 10/06/19 1029  SpO2 100 % 10/06/19 1029  Vitals shown include unvalidated device data.  Last Pain:  Vitals:   10/06/19 1028  TempSrc:   PainSc: 0-No pain         Complications: No apparent anesthesia complications

## 2019-10-06 NOTE — Op Note (Signed)
[facility] Gastroenterology Patient Name: Susan Soto Procedure Date: 10/06/2019 10:08 AM MRN: [id number] Account #: [id number] Date of Birth: March 26, 1966 Admit Type: Outpatient Age: 53 Room: [facility] ENDO ROOM 4 Gender: Female Note Status: Finalized Procedure:             Colonoscopy Indications:           Screening for colorectal malignant neoplasm Providers:             Lucilla Lame MD, MD Referring MD:          Ria Bush (Referring MD) Medicines:             Propofol per Anesthesia Complications:         No immediate complications. Procedure:             Pre-Anesthesia Assessment:                        - Prior to the procedure, a History and Physical was                         performed, and patient medications and allergies were                         reviewed. The patient's tolerance of previous                         anesthesia was also reviewed. The risks and benefits                         of the procedure and the sedation options and risks                         were discussed with the patient. All questions were                         answered, and informed consent was obtained. Prior                         Anticoagulants: The patient has taken no previous                         anticoagulant or antiplatelet agents. ASA Grade                         Assessment: II - A patient with mild systemic disease.                         After reviewing the risks and benefits, the patient                         was deemed in satisfactory condition to undergo the                         procedure.                        After obtaining informed consent, the colonoscope was  passed under direct vision. Throughout the procedure,                         the patient's blood pressure, pulse, and oxygen                         saturations were monitored continuously. The                         Colonoscope was introduced through the  anus and                         advanced to the the cecum, identified by appendiceal                         orifice and ileocecal valve. The colonoscopy was                         performed without difficulty. The patient tolerated                         the procedure well. The quality of the bowel                         preparation was excellent. Findings:      The perianal and digital rectal examinations were normal.      Non-bleeding internal hemorrhoids were found during retroflexion. The       hemorrhoids were Grade I (internal hemorrhoids that do not prolapse). Impression:            - Non-bleeding internal hemorrhoids.                        - No specimens collected. Recommendation:        - Discharge patient to home.                        - Resume previous diet.                        - Continue present medications. Procedure Code(s):     --- Professional ---                        [id number], Colonoscopy, flexible; diagnostic, including                         collection of specimen(s) by brushing or washing, when                         performed (separate procedure) Diagnosis Code(s):     --- Professional ---                        Z12.11, Encounter for screening for malignant neoplasm                         of colon CPT copyright 2019 American Medical Association. All rights reserved. The codes documented in this report are preliminary and upon coder review may  be revised to meet current compliance requirements. Lucilla Lame MD, MD 10/06/2019  10:25:07 AM This report has been signed electronically. Number of Addenda: 0 Note Initiated On: 10/06/2019 10:08 AM Scope Withdrawal Time: 0 hours 7 minutes 25 seconds  Total Procedure Duration: 0 hours 11 minutes 20 seconds  Estimated Blood Loss:  Estimated blood loss: none.      [facility]

## 2019-10-06 NOTE — Anesthesia Post-op Follow-up Note (Signed)
Anesthesia QCDR form completed.        

## 2019-10-06 NOTE — Anesthesia Postprocedure Evaluation (Signed)
Anesthesia Post Note  Patient: Susan Soto  Procedure(s) Performed: COLONOSCOPY WITH PROPOFOL (N/A )  Patient location during evaluation: Endoscopy Anesthesia Type: General Level of consciousness: awake and alert and oriented Pain management: pain level controlled Vital Signs Assessment: post-procedure vital signs reviewed and stable Respiratory status: spontaneous breathing Cardiovascular status: blood pressure returned to baseline Anesthetic complications: no     Last Vitals:  Vitals:   10/06/19 1038 10/06/19 1048  BP: 105/71 112/78  Pulse: 62   Resp: 16 13  Temp:    SpO2: 99% 95%    Last Pain:  Vitals:   10/06/19 1048  TempSrc:   PainSc: 0-No pain                 Milessa Hogan

## 2019-10-07 ENCOUNTER — Encounter: Payer: Self-pay | Admitting: *Deleted

## 2019-10-13 ENCOUNTER — Telehealth: Payer: Self-pay | Admitting: Adult Health

## 2019-10-13 NOTE — Telephone Encounter (Signed)
This is fine 

## 2019-10-13 NOTE — Telephone Encounter (Signed)
Patient called requesting a mychart apt.

## 2019-10-14 ENCOUNTER — Telehealth (INDEPENDENT_AMBULATORY_CARE_PROVIDER_SITE_OTHER): Payer: Managed Care, Other (non HMO) | Admitting: Adult Health

## 2019-10-14 DIAGNOSIS — G5603 Carpal tunnel syndrome, bilateral upper limbs: Secondary | ICD-10-CM

## 2019-10-14 DIAGNOSIS — R4189 Other symptoms and signs involving cognitive functions and awareness: Secondary | ICD-10-CM | POA: Diagnosis not present

## 2019-10-14 DIAGNOSIS — G479 Sleep disorder, unspecified: Secondary | ICD-10-CM

## 2019-10-14 NOTE — Progress Notes (Addendum)
PATIENT: Susan Soto DOB: 03-16-1966  REASON FOR VISIT: follow up HISTORY FROM: patient  Virtual Visit via Video Note  I connected with Susan Soto on 10/14/19 at 11:30 AM EST by a video enabled telemedicine application located remotely at [facility] and verified that I am speaking with the correct person using two identifiers who was located at their own home.   I discussed the limitations of evaluation and management by telemedicine and the availability of in person appointments. The patient expressed understanding and agreed to proceed.   PATIENT: Susan Soto DOB: 19-Jan-1966  REASON FOR VISIT: follow up HISTORY FROM: patient  HISTORY OF PRESENT ILLNESS: Today 10/14/19:  Susan Soto is a 53 year old female with a history of sleep disturbance, cognitive disturbance and bilateral carpal tunnel syndrome.  She joins me today for virtual visit.  She reports that over the last 5 months she has noticed some improvement in her symptoms however she feels that her improvement is not enough for her to go back to work.  She states that she still feels tired the next day although being out of work she is able to sleep more.  She has questions regarding her sleep study.  Sleep study did show that she was weight of REM sleep but did not identify any causes other than medication.  The patient clarifies that she did not take her ADD medication that day.  She currently is taking melatonin 5 mg at bedtime.  She is using wrist splints for her carpal tunnel.  She has an appointment set up in February with neuropsychiatry.  She states that she has reduced her ADD medication.  She takes 30 mg daily and on occasion she may take 10 to 20 mg in the afternoon if needed.  She reports that she used to take double this dose when she was working.  She returns today for an evaluation.  HISTORY Aniyah Nobis Soto a 53 y.o. year old White or Caucasian female patientseen here on 07/02/2019  from Dr Dionisio Paschal.   Chiefconcernaccording to patient : "pt states that she has difficulty with sleeping during the night. she has been doing that for several months. even if she goes to bed at normal time or if she sleeps 8-10 hrs she will still wake up tired. + snores. lasy SS over 15 yrs. She also reported " a hard time with brain fog " for which her Psychiatrist , Dr Stormy Fabian, has taken her out of work- FMLA for over 2 month? . She believes brain fog and ADHD started with breast cancer chemotherapy.  She reports having an easy case load and still unable to do her job.     REVIEW OF SYSTEMS: Out of a complete 14 system review of symptoms, the patient complains only of the following symptoms, and all other reviewed systems are negative.  See HPI  ALLERGIES: Allergies  Allergen Reactions  . Bupropion Hcl     REACTION: Hives  . Buspar [Buspirone Hcl] Nausea And Vomiting  . Other Other (See Comments)    Dissolving sutures Surgical glue bandaids   . Strattera [Atomoxetine Hcl] Other (See Comments)    Urinary trouble  . Tegaderm Ag Mesh [Silver]     HOME MEDICATIONS: Outpatient Medications Prior to Visit  Medication Sig Dispense Refill  . ASHWAGANDHA PO Take 375 mg by mouth at bedtime.    . Cholecalciferol (VITAMIN D-3) 5000 UNITS TABS Take 2 tablets by mouth daily.     Marland Kitchen  COLLAGEN PO Take 1 tablet by mouth daily.    Marland Kitchen dextroamphetamine (DEXTROSTAT) 10 MG tablet Take 30 mg by mouth. TAKE 3 TABLETS ('40MG'$ ) ONCE DAILY AT LUNCHTIME. Can take 1-2 tablets in the afternoon if needed.  0  . diazepam (VALIUM) 2 MG tablet Take 2 mg by mouth every 12 (twelve) hours as needed for anxiety (bladder retention).     Mariane Baumgarten Calcium (STOOL SOFTENER PO) Take by mouth as needed.    . lamoTRIgine (LAMICTAL) 25 MG tablet Take 25 mg by mouth daily. Takes 2 tablets once daily    . levothyroxine (SYNTHROID) 50 MCG tablet Take 1 tablet (50 mcg total) by mouth daily. 90 tablet 3  . medium chain  triglycerides (MCT OIL) oil Take by mouth 3 (three) times daily. Takes 3 TBSP daily    . MILK THISTLE PO Take 1,000 mg by mouth.    . Multiple Vitamins-Minerals (ANTIOXIDANT FORMULA SG) capsule Take 1 capsule by mouth daily. Powder daily    . NON FORMULARY Vitamins ADEK    . Omega-3 Fatty Acids (OMEGA 3 PO) Take 690 mg by mouth daily. Nordic naturals    . OVER THE COUNTER MEDICATION 2 capsules daily. KETO- metabolic ketosis support    . OVER THE COUNTER MEDICATION 1 tablet daily. MT Promoter Brain Science 350 mg    . Probiotic Product (PROBIOTIC DAILY PO) Take by mouth daily.    Marland Kitchen pyridoxine (B-6) 100 MG tablet Take 100 mg by mouth daily.    . TURMERIC PO Take by mouth daily. Takes 3 tablets once daily    . valACYclovir (VALTREX) 1000 MG tablet Take 2 tablets (2,000 mg total) by mouth 2 (two) times daily. x1 day 20 tablet 1  . vitamin C (ASCORBIC ACID) 500 MG tablet Take 500 mg by mouth daily.    Marland Kitchen zinc gluconate 50 MG tablet Take 50 mg by mouth daily.     No facility-administered medications prior to visit.    PAST MEDICAL HISTORY: Past Medical History:  Diagnosis Date  . ADD (attention deficit disorder) 1990s  . Anxiety   . Bilateral carpal tunnel syndrome 08/10/2019  . BRCA negative 05/20/2017   MyRisk neg  . 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   . Genetic testing of female 05/2017  . Hemorrhoids   . History of breast cancer 2009   Left side, lumpectomy, s/p sentinal LN biopsy, chemo and radiation, remission  . Malignant neoplasm of upper-outer quadrant of female breast ([place]) 05/27/2008   Left breast, 1.3 cm triple negative, node negative invasive mammary carcinoma. T1c, N0  . Personal history of chemotherapy 2009   left breast ca  . Personal history of radiation therapy 2009   left breast ca  . PONV (postoperative nausea and vomiting)   . Subclinical hypothyroidism   . Syncope and collapse     PAST SURGICAL  HISTORY: Past Surgical History:  Procedure Laterality Date  . bartholin's cystectomy    . BREAST BIOPSY Left 05/07/08   Left (infitr ductal CA)- Dr. Bary Castilla  . BREAST BIOPSY Left 06/25/13   left (benign) atrophic ductal epithelium, microcalcifications, changes of previous surgery.  Marland Kitchen BREAST LUMPECTOMY Left 05/27/2008   invasive ductal carcinoma  . breast wide excision  05/27/08   Left-with sentinel node bx (Dr. Bary Castilla)  . COLONOSCOPY  07/2009   WNL ([place])  . COLONOSCOPY WITH PROPOFOL N/A 10/06/2019   Procedure: COLONOSCOPY WITH PROPOFOL;  Surgeon: Lucilla Lame,  MD;  Location: [facility];  Service: Endoscopy;  Laterality: N/A;  . CYSTOSCOPY    . episiotomy      FAMILY HISTORY: Family History  Problem Relation Age of Onset  . Drug abuse Father        Heroin  . Hypertension Mother   . Fibromyalgia Mother   . ADD / ADHD Mother   . Depression Mother   . Cancer Mother        cervical, bladder, non melanoma skin  . Colon polyps Mother        Hyperplastic  . ADD / ADHD Brother   . Anxiety disorder Brother   . Stroke Maternal Grandmother   . Breast cancer Maternal Grandmother        50's  . Hypertension Paternal Grandmother   . Cancer Paternal Grandfather        lung  . Hypothyroidism Maternal Aunt   . Cancer Maternal Aunt        vulvar, cervical, non melanoma skin  . Hypothyroidism Maternal Uncle   . Cancer Maternal Aunt        cervical, lung, precancerous colon polyps  . Diabetes Neg Hx   . Coronary artery disease Neg Hx     SOCIAL HISTORY: Social History   Socioeconomic History  . Marital status: Married    Spouse name: Not on file  . Number of children: Not on file  . Years of education: Not on file  . Highest education level: Not on file  Occupational History  . Occupation: Education officer, museum  Tobacco Use  . Smoking status: Never Smoker  . Smokeless tobacco: Never Used  Substance and Sexual Activity  . Alcohol use: No  . Drug use: No  . Sexual activity: Not on  file  Other Topics Concern  . Not on file  Social History Narrative   Caffeine: 1 cup coffee in am   Lives with husband and 1 son, daughter in college, 1 dog and 1 cat, 5-6 outside cats   Occupation: Camera operator job   Activity: no regular activity   Diet: joined [employer], good water, fruits/vegetables daily, red meat 3x/wk, fish 2x/wk      Psych- Dr. Wallis Mart in [place] (ph [phone])   Social Determinants of Health   Financial Resource Strain:   . Difficulty of Paying Living Expenses: Not on file  Food Insecurity:   . Worried About Charity fundraiser in the Last Year: Not on file  . Ran Out of Food in the Last Year: Not on file  Transportation Needs:   . Lack of Transportation (Medical): Not on file  . Lack of Transportation (Non-Medical): Not on file  Physical Activity:   . Days of Exercise per Week: Not on file  . Minutes of Exercise per Session: Not on file  Stress:   . Feeling of Stress : Not on file  Social Connections:   . Frequency of Communication with Friends and Family: Not on file  . Frequency of Social Gatherings with Friends and Family: Not on file  . Attends Religious Services: Not on file  . Active Member of Clubs or Organizations: Not on file  . Attends Archivist Meetings: Not on file  . Marital Status: Not on file  Intimate Partner Violence:   . Fear of Current or Ex-Partner: Not on file  . Emotionally Abused: Not on file  . Physically Abused: Not on file  . Sexually Abused: Not on file  PHYSICAL EXAM Generalized: Well developed, in no acute distress   Neurological examination  Mentation: Alert oriented to time, place, history taking. Follows all commands speech and language fluent Cranial nerve II-XII:Extraocular movements were full. Facial symmetry noted.  Head turning and shoulder shrug  were normal and symmetric. Motor: Good strength throughout subjectively per patient Sensory: Sensory testing is intact to soft  touch on all 4 extremities subjectively per patient Coordination: Cerebellar testing reveals good finger-nose-finger  Gait and station: Patient is able to stand from a seated position. gait is normal.  Reflexes: UTA  DIAGNOSTIC DATA (LABS, IMAGING, TESTING) - I reviewed patient records, labs, notes, testing and imaging myself where available.  Lab Results  Component Value Date   WBC 6.9 09/22/2019   HGB 12.9 09/22/2019   HCT 38.3 09/22/2019   MCV 89 09/22/2019   PLT 297 09/22/2019      Component Value Date/Time   NA 141 09/22/2019 1639   K 4.4 09/22/2019 1639   CL 102 09/22/2019 1639   CO2 27 09/22/2019 1639   GLUCOSE 80 09/22/2019 1639   GLUCOSE 108 (H) 08/13/2018 1116   BUN 13 09/22/2019 1639   CREATININE 0.56 (L) 09/22/2019 1639   CREATININE 0.61 07/22/2015 0000   CALCIUM 9.9 09/22/2019 1639   PROT 6.9 09/22/2019 1639   PROT 7.0 07/23/2014 1024   ALBUMIN 4.3 09/22/2019 1639   ALBUMIN 4.1 07/23/2014 1024   AST 20 09/22/2019 1639   AST 13 07/22/2015 0000   ALT 27 09/22/2019 1639   ALT 15 07/22/2015 0000   ALKPHOS 56 09/22/2019 1639   ALKPHOS 45 07/22/2015 0000   BILITOT 0.3 09/22/2019 1639   BILITOT 0.4 07/22/2015 0000   GFRNONAA 106 09/22/2019 1639   GFRNONAA >60 07/23/2014 1024   GFRNONAA >60 07/22/2013 0949   GFRAA 122 09/22/2019 1639   GFRAA >60 07/23/2014 1024   GFRAA >60 07/22/2013 0949   Lab Results  Component Value Date   CHOL 160 09/22/2019   HDL 64 09/22/2019   LDLCALC 75 09/22/2019   TRIG 117 09/22/2019   CHOLHDL 2.5 09/22/2019   Lab Results  Component Value Date   HGBA1C 5.4 02/19/2019   Lab Results  Component Value Date   VITAMINB12 740 06/11/2019   Lab Results  Component Value Date   TSH 3.260 09/22/2019      ASSESSMENT AND PLAN 53 y.o. year old female  has a past medical history of ADD (attention deficit disorder) (1990s), Anxiety, Bilateral carpal tunnel syndrome (08/10/2019), BRCA negative (05/20/2017), Breast cancer ([place])  (2009), Genetic testing of female (05/2017), Hemorrhoids, History of breast cancer (2009), Malignant neoplasm of upper-outer quadrant of female breast ([place]) (05/27/2008), Personal history of chemotherapy (2009), Personal history of radiation therapy (2009), PONV (postoperative nausea and vomiting), Subclinical hypothyroidism, and Syncope and collapse. here with:  1: Sleep disturbance  -Advised that I would follow-up with Dr. Brett Fairy and have her review the sleep study.  Advised I would be in touch in the next 1 to 2 weeks. -Okay to increase melatonin to 10 mg at bedtime (take 1-2 hours before bed)  2: Cognitive disturbance  -Keep neuropsychology appointment in February  3: Bilateral carpal tunnel syndrome  -Continue wearing wrist splints can refer to hand surgeon if needed   Advised that if her symptoms worsen or she develops new symptoms she should let us know.  She will follow-up in 6 months or sooner if needed     Ward Givens, MSN, NP-C 10/14/2019, 11:51 AM [facility] [address], [address] [place], [place] [address] [phone]   

## 2019-11-18 ENCOUNTER — Telehealth: Payer: Self-pay | Admitting: Neurology

## 2019-11-18 NOTE — Telephone Encounter (Signed)
Valium and Lamictal are also REM sleep supressant medications, and their metabolites stay in the system over 3-7 days.  Delayed REM onset or total lack of REM sleep are frequently seen, and mostly related to medications.  This does not constitute a sleep disorder, only an alteration in sleep architecture.

## 2019-11-19 ENCOUNTER — Encounter: Payer: Self-pay | Admitting: Neurology

## 2019-11-23 ENCOUNTER — Other Ambulatory Visit: Payer: Self-pay

## 2019-11-23 ENCOUNTER — Encounter: Payer: Managed Care, Other (non HMO) | Attending: Psychology | Admitting: Psychology

## 2019-11-23 DIAGNOSIS — F09 Unspecified mental disorder due to known physiological condition: Secondary | ICD-10-CM | POA: Diagnosis present

## 2019-11-23 DIAGNOSIS — F900 Attention-deficit hyperactivity disorder, predominantly inattentive type: Secondary | ICD-10-CM | POA: Diagnosis present

## 2019-11-23 DIAGNOSIS — F0789 Other personality and behavioral disorders due to known physiological condition: Secondary | ICD-10-CM | POA: Diagnosis present

## 2019-11-23 DIAGNOSIS — R4189 Other symptoms and signs involving cognitive functions and awareness: Secondary | ICD-10-CM | POA: Diagnosis not present

## 2019-12-02 ENCOUNTER — Telehealth: Payer: Self-pay

## 2019-12-02 NOTE — Telephone Encounter (Signed)
Pt has NV tomorrow for shingrix vaccine. Need to screen pt for covid symptoms.  LVM

## 2019-12-03 ENCOUNTER — Ambulatory Visit: Payer: Managed Care, Other (non HMO)

## 2019-12-03 ENCOUNTER — Other Ambulatory Visit: Payer: Self-pay

## 2019-12-03 ENCOUNTER — Ambulatory Visit (INDEPENDENT_AMBULATORY_CARE_PROVIDER_SITE_OTHER): Payer: Managed Care, Other (non HMO) | Admitting: *Deleted

## 2019-12-03 DIAGNOSIS — Z23 Encounter for immunization: Secondary | ICD-10-CM

## 2019-12-03 NOTE — Progress Notes (Addendum)
Per orders of Duncan in Dr. Bosie Clos absence, injection of Shingrix #2 given by Virl Cagey. Patient tolerated injection well.

## 2020-01-11 NOTE — Progress Notes (Addendum)
Neuropsychological Consultation   Patient:   Susan Soto   DOB:   Jan 26, 1966  MR Number:  [id number]  Location:  [facility] [place], [place] [id number] [facility] [place]  [id number] Dept: [phone]           Date of Service:   11/23/2019  Start Time:   3 PM End Time:   4 PM  Today's visit was an in person visit that was conducted in my outpatient clinic office.  The patient myself were present for this appointment.  1 hour was spent in a face-to-face clinical interview as well as 1 hour with records review and report writing.  Provider/Observer:  Ilean Skill, Psy.D.       Clinical Neuropsychologist       Billing Code/Service: 96116/96121  Chief Complaint:    Havanah Nelms is a 54 year old female referred by Daymon Larsen, MD with [facility].  The patient has a history of sleep disturbance with reports of cognitive disturbance and bilateral carpal tunnel syndrome.  The patient has been out of work after taking a medical leave in 2017 but then returning to work in a different job.  The patient has a history of major depressive disorder, adult residual attention deficit disorder and potentially residual cognitive difficulties after being diagnosed and treated for breast cancer in 2009 with both chemotherapy and radiation.  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  Reason for Service:  Nawaal Alling is a 54 year old female referred by Daymon Larsen, MD with [facility].  The patient has a history of sleep disturbance with reports of cognitive disturbance and bilateral carpal tunnel syndrome.  The patient has been out of work after taking a medical leave in 2017 but then returning to work in a different job.   The patient has a history of major depressive disorder, adult residual attention deficit disorder and potentially residual cognitive difficulties after being diagnosed and treated for breast cancer in 2009 with both chemotherapy and radiation.  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  Patient reports that she was diagnosed with inattentive type attention deficit disorder in her early 81s and always had significant weakness keeping up with paperwork and documentation with her job.  The patient works as a Education officer, museum.  The patient reports that she also had significant issues with major depressive disorder.  She reports that she took a medical leave of absence in 2017 after developing significant exacerbation of her major depression with her mother passing away.  The patient returned to work after couple of months with an opportunity for a different job that had less demands and lower caseload.  Patient reports that she continued to have difficulty keeping up with any kind of paperwork and her previous strategies for dealing with that were not very helpful.  The patient reports that if she is not taking any medications that she just stays in bed, has no energy and significant difficulty with motivation.  The patient reports that this was true even when she was a young child.  The patient reports that she had an exacerbation of her difficulties after her chemotherapy treatment for breast cancer.  The patient reports that  initially she experienced "chemo brain" but got better and then in 2017 her symptoms returned and she deteriorated.  The patient reports that typically she would have times of "hyperfocus" to get caught up now she cannot "hyperfocus" and has difficulty keeping up with paperwork.  The patient reports that she made improvements while out on FMLA  after her mother and close aunt died.  The patient reports that she has seen a psychiatrist for 20 years in [place].  Patient reports being extremely stressed at work and feeling like she is extremely behind on her caseload and paperwork.  Her job has allowed her to start coming in at 10 AM to help her get better sleep.  Even with this adjustment the patient has difficulty getting into work before 11 AM.  The patient reports that she cant get her thoughts focused enough to do anything even at home.  She is trying to work long enough to retire early.   The patient is not sleeping well but no significant findings on sleep study beyond delayed REM onset.  The patient had been on extreme low fat diet for many years but has stopped this diet.    The patient now has sudden onset of anxiety and panic attacks.    03/28/20 Update:  The patient returned for visit and brought the paperwork that she had forgotten in Feb.  The patient described a a decline in memory and executive functioning along with increasing anxiety and worsening of ADD/attention symptoms.  She describes new onset panic attacks within the past 6 months.  "pins and needles and pain in fingers described".  She describes a decline in her ability to perform tasks that involve concentration.  She continues to use Dexedrine for attention along with valium 1-2 times per day.  The patient had been out on FMLA due to major depresion and new onset of anxiety.  FMLA has run out and the patient has had to stop working but her short-term disability has denied her so far, attributing it to a pre-existing condition according to patient.  The patient describes severe insomnia (not OSA) but has had abnormalities on sleep study related to reduction in REM sleep.    Behavioral Observation: [facility]  presents as a 54 y.o.-year-old Right Caucasian Female who appeared her stated age. her dress was Appropriate and she was Well Groomed and her manners were  Appropriate to the situation.  her participation was indicative of Appropriate and Redirectable behaviors.  There were not any physical disabilities noted.  she displayed an appropriate level of cooperation and motivation.     Interactions:    Active Appropriate and Redirectable  Attention:   abnormal and attention span appeared shorter than expected for age  Memory:   within normal limits; recent and remote memory intact  Visuo-spatial:  not examined  Speech (Volume):  normal  Speech:   normal; normal  Thought Process:  Coherent and Relevant  Though Content:  WNL; not suicidal and not homicidal  Orientation:   person, place, time/date and situation  Judgment:   Good  Planning:   Fair  Affect:    Anxious  Mood:    Anxious  Insight:   Good  Intelligence:   high  Medical History:   Past Medical History:  Diagnosis Date  . ADD (attention deficit disorder) 1990s  . Anxiety   . Bilateral carpal tunnel syndrome 08/10/2019  . BRCA negative 05/20/2017   MyRisk neg  . 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   . Genetic testing of female 05/2017  . Hemorrhoids   . History of breast cancer 2009   Left side, lumpectomy, s/p sentinal LN biopsy, chemo and radiation, remission  . Malignant neoplasm of upper-outer quadrant of female breast ([place]) 05/27/2008   Left breast, 1.3 cm triple negative, node negative invasive mammary carcinoma. T1c, N0  . Personal history of chemotherapy 2009   left breast ca  . Personal history of radiation therapy 2009   left breast ca  . PONV (postoperative nausea and vomiting)   . Subclinical hypothyroidism   . Syncope and collapse    Psychiatric History:  Patient has history  Of dx of Adult residual attention deficit disorder in her 4's, chemotherapy for breat cancer with cognitive changes, major depressive disorder, anxiety and panic attacks.  Family Med/Psych History:  Family History   Problem Relation Age of Onset  . Drug abuse Father        Heroin  . Hypertension Mother   . Fibromyalgia Mother   . ADD / ADHD Mother   . Depression Mother   . Cancer Mother        cervical, bladder, non melanoma skin  . Colon polyps Mother        Hyperplastic  . ADD / ADHD Brother   . Anxiety disorder Brother   . Stroke Maternal Grandmother   . Breast cancer Maternal Grandmother        50's  . Hypertension Paternal Grandmother   . Cancer Paternal Grandfather        lung  . Hypothyroidism Maternal Aunt   . Cancer Maternal Aunt        vulvar, cervical, non melanoma skin  . Hypothyroidism Maternal Uncle   . Cancer Maternal Aunt        cervical, lung, precancerous colon polyps  . Diabetes Neg Hx   . Coronary artery disease Neg Hx     Impression/DX:  Elnor Renovato is a 54 year old female referred by Daymon Larsen, MD with [facility].  The patient has a history of sleep disturbance with reports of cognitive disturbance and bilateral carpal tunnel syndrome.  The patient has been out of work after taking a medical leave in 2017 but then returning to work in a different job.  The patient has a history of major depressive disorder, adult residual attention deficit disorder and potentially residual cognitive difficulties after being diagnosed and treated for breast cancer in 2009 with both chemotherapy and radiation.  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  Disposition/Plan:  We will initially work on therapeutic efforts for depression, anxiety and sleep issues.  May also look at formal neuropsychological testing after improvements in depression and anxiety.    Diagnosis:    Cognitive and neurobehavioral dysfunction  Attention deficit hyperactivity disorder (ADHD), predominantly inattentive type         Electronically  Signed   _______________________ Ilean Skill, Psy.D.

## 2020-02-25 ENCOUNTER — Ambulatory Visit: Payer: Managed Care, Other (non HMO) | Admitting: Family Medicine

## 2020-02-26 ENCOUNTER — Encounter: Payer: Self-pay | Admitting: Family Medicine

## 2020-02-26 ENCOUNTER — Ambulatory Visit (INDEPENDENT_AMBULATORY_CARE_PROVIDER_SITE_OTHER): Payer: Managed Care, Other (non HMO) | Admitting: Family Medicine

## 2020-02-26 ENCOUNTER — Other Ambulatory Visit: Payer: Self-pay

## 2020-02-26 VITALS — BP 120/70 | HR 73 | Temp 97.9°F | Ht 65.0 in | Wt 178.6 lb

## 2020-02-26 DIAGNOSIS — G479 Sleep disorder, unspecified: Secondary | ICD-10-CM

## 2020-02-26 DIAGNOSIS — E559 Vitamin D deficiency, unspecified: Secondary | ICD-10-CM

## 2020-02-26 DIAGNOSIS — E038 Other specified hypothyroidism: Secondary | ICD-10-CM

## 2020-02-26 DIAGNOSIS — R5382 Chronic fatigue, unspecified: Secondary | ICD-10-CM

## 2020-02-26 DIAGNOSIS — F0789 Other personality and behavioral disorders due to known physiological condition: Secondary | ICD-10-CM

## 2020-02-26 DIAGNOSIS — R3915 Urgency of urination: Secondary | ICD-10-CM | POA: Diagnosis not present

## 2020-02-26 DIAGNOSIS — E039 Hypothyroidism, unspecified: Secondary | ICD-10-CM | POA: Diagnosis not present

## 2020-02-26 DIAGNOSIS — F09 Unspecified mental disorder due to known physiological condition: Secondary | ICD-10-CM | POA: Diagnosis not present

## 2020-02-26 DIAGNOSIS — R32 Unspecified urinary incontinence: Secondary | ICD-10-CM

## 2020-02-26 DIAGNOSIS — F419 Anxiety disorder, unspecified: Secondary | ICD-10-CM

## 2020-02-26 DIAGNOSIS — E041 Nontoxic single thyroid nodule: Secondary | ICD-10-CM

## 2020-02-26 DIAGNOSIS — F900 Attention-deficit hyperactivity disorder, predominantly inattentive type: Secondary | ICD-10-CM

## 2020-02-26 LAB — POC URINALSYSI DIPSTICK (AUTOMATED)
Bilirubin, UA: NEGATIVE
Blood, UA: NEGATIVE
Glucose, UA: NEGATIVE
Ketones, UA: NEGATIVE
Leukocytes, UA: NEGATIVE
Nitrite, UA: NEGATIVE
Protein, UA: NEGATIVE
Spec Grav, UA: 1.01 (ref 1.010–1.025)
Urobilinogen, UA: 0.2 E.U./dL
pH, UA: 6 (ref 5.0–8.0)

## 2020-02-26 NOTE — Patient Instructions (Addendum)
Urine looking ok today. Limit bladder irritants such as spicy foods, refined sugars and caffeinated beverages.  I'd suggest limit water intake to 64 ounces a day especially if more water worsens stress symptoms.  Work on Kegel exercises to help stress incontinence symptoms.

## 2020-02-26 NOTE — Progress Notes (Signed)
This visit was conducted in person.  BP 120/70 (BP Location: Right Arm, Patient Position: Sitting, Cuff Size: Normal)   Pulse 73   Temp 97.9 F (36.6 C) (Temporal)   Ht 5\' 5"  (1.651 m)   Wt 178 lb 9 oz (81 kg)   LMP 03/04/2014   SpO2 96%   BMI 29.71 kg/m    CC: discuss labs  Subjective:    Patient ID: Susan Soto, female    DOB: [birth year], 54 y.o.   MRN: [id number]  HPI: Susan Soto is a 54 y.o. female presenting on 02/26/2020 for Urinary Urgency (C/o strong urge to urinate then barely any volume.  Sxs started several mos ago. ) and Labs (Requests labs for thyroid and full blood work up.  Also, wants vit B12, vit D and iron labs.  )   She decided to go into early retirement. Has been approved for disability retirement.   She is seeing neuropsychologist (Dr Sima Matas) - foind to have poor REM sleep due to a circadian rhythm disorder. Advised to get sun therapy - needs to be outdoors during sunrise and sunset. Has been doing this since 11/2019 and notes improvement in mood, still struggles with fatigue.   Several month h/o worsening urinary urgency with incomplete emptying. No dysuria, hematuria, abd pain, nausea, flank pain, fevers or chills. No urinary accidents. She does have some stress incontinence symptoms. She already limits spicy foods, dark sodas and caffeinated beverages. Drinking about 3-4 16oz bottles/day.    Requests labs done today - monitoring thyroid function. Notes worsening anxiety recently. She has decreased levothyroxine to 56mcg daily.   Planning to start new health plan - [employer] using [facility] Nutrition supplements. High daily water intake requirement, worried urinary issues may worsen. Plan calls for adding creatine monohydrate supplement to regimen - wanted to ensure kidneys are healthy for this.      Relevant past medical, surgical, family and social history reviewed and updated as indicated. Interim medical history since our last visit  reviewed. Allergies and medications reviewed and updated. Outpatient Medications Prior to Visit  Medication Sig Dispense Refill  . Ascorbic Acid (VITAMIN C) POWD Take 1,000 mg by mouth daily.    . ASHWAGANDHA PO Take 375 mg by mouth at bedtime.    . Cholecalciferol (VITAMIN D-3) 5000 UNITS TABS Take 2 tablets by mouth daily.     . COLLAGEN PO Take 1 tablet by mouth daily.    Marland Kitchen dextroamphetamine (DEXTROSTAT) 10 MG tablet Take 30 mg by mouth. TAKE 1 TABLETS (10 MG) ONCE DAILY AT LUNCHTIME. Can take 1-2 tablets in the afternoon if needed.  0  . diazepam (VALIUM) 2 MG tablet Take 2 mg by mouth every 12 (twelve) hours as needed for anxiety (bladder retention).     Mariane Baumgarten Calcium (STOOL SOFTENER PO) Take by mouth as needed.    . lamoTRIgine (LAMICTAL) 25 MG tablet Take 25 mg by mouth daily. Takes 2 tablets once daily    . levothyroxine (SYNTHROID) 25 MCG tablet Take 25 mcg by mouth daily before breakfast.    . medium chain triglycerides (MCT OIL) oil Take by mouth 3 (three) times daily. Takes 3 TBSP daily    . Multiple Vitamins-Minerals (ANTIOXIDANT FORMULA SG) capsule Take 1 capsule by mouth daily. Powder daily    . NON FORMULARY Vitamins ADEK    . Omega-3 Fatty Acids (OMEGA 3 PO) Take 690 mg by mouth daily. Nordic naturals    . OVER THE  COUNTER MEDICATION 2 capsules daily. KETO- metabolic ketosis support    . OVER THE COUNTER MEDICATION 1 tablet daily. MT [employer] 350 mg    . Probiotic Product (PROBIOTIC DAILY PO) Take by mouth daily.    Marland Kitchen pyridoxine (B-6) 100 MG tablet Take 100 mg by mouth daily.    . TURMERIC PO Take by mouth daily. Takes 3 tablets once daily    . valACYclovir (VALTREX) 1000 MG tablet Take 2 tablets (2,000 mg total) by mouth 2 (two) times daily. x1 day 20 tablet 1  . zinc gluconate 50 MG tablet Take 50 mg by mouth daily.    Marland Kitchen levothyroxine (SYNTHROID) 50 MCG tablet Take 1 tablet (50 mcg total) by mouth daily. 90 tablet 3  . MILK THISTLE PO Take 1,000 mg by  mouth.    . vitamin C (ASCORBIC ACID) 500 MG tablet Take 500 mg by mouth daily.     No facility-administered medications prior to visit.     Per HPI unless specifically indicated in ROS section below Review of Systems Objective:  BP 120/70 (BP Location: Right Arm, Patient Position: Sitting, Cuff Size: Normal)   Pulse 73   Temp 97.9 F (36.6 C) (Temporal)   Ht 5\' 5"  (1.651 m)   Wt 178 lb 9 oz (81 kg)   LMP 03/04/2014   SpO2 96%   BMI 29.71 kg/m   Wt Readings from Last 3 Encounters:  02/26/20 178 lb 9 oz (81 kg)  10/06/19 173 lb 5.6 oz (78.6 kg)  09/22/19 174 lb 3 oz (79 kg)      Physical Exam Vitals and nursing note reviewed.  Constitutional:      Appearance: Normal appearance. She is not ill-appearing.  HENT:     Head: Normocephalic and atraumatic.  Eyes:     General:        Right eye: No discharge.        Left eye: No discharge.     Extraocular Movements: Extraocular movements intact.     Conjunctiva/sclera: Conjunctivae normal.     Pupils: Pupils are equal, round, and reactive to light.  Cardiovascular:     Rate and Rhythm: Normal rate and regular rhythm.     Pulses: Normal pulses.     Heart sounds: Normal heart sounds. No murmur.  Pulmonary:     Effort: Pulmonary effort is normal. No respiratory distress.     Breath sounds: Normal breath sounds. No wheezing, rhonchi or rales.  Musculoskeletal:     Cervical back: Normal range of motion. No rigidity.     Right lower leg: No edema.     Left lower leg: No edema.  Lymphadenopathy:     Cervical: No cervical adenopathy.  Skin:    General: Skin is warm and dry.     Findings: No rash.  Neurological:     Mental Status: She is alert.  Psychiatric:        Mood and Affect: Mood normal.        Behavior: Behavior normal.       Results for orders placed or performed in visit on 02/26/20  POCT Urinalysis Dipstick (Automated)  Result Value Ref Range   Color, UA light yellow    Clarity, UA clear    Glucose, UA  Negative Negative   Bilirubin, UA negative    Ketones, UA negative    Spec Grav, UA 1.010 1.010 - 1.025   Blood, UA negative    pH, UA 6.0 5.0 - 8.0  Protein, UA Negative Negative   Urobilinogen, UA 0.2 0.2 or 1.0 E.U./dL   Nitrite, UA negative    Leukocytes, UA Negative Negative   Assessment & Plan:  This visit occurred during the SARS-CoV-2 public health emergency.  Safety protocols were in place, including screening questions prior to the visit, additional usage of staff PPE, and extensive cleaning of exam room while observing appropriate contact time as indicated for disinfecting solutions.  Will be due for pelvic/pap at next wellness visit (Q2 yr pap) - will further eval possible stress incontinence at that time. Problem List Items Addressed This Visit    Vitamin D deficiency    Update levels on 2 tablets of 5000 IU daily.       Relevant Orders   VITAMIN D 25 Hydroxy (Vit-D Deficiency, Fractures)   Thyroid nodule    Followed by gen surg at this time ([facility]).       Relevant Medications   levothyroxine (SYNTHROID) 25 MCG tablet   Subclinical hypothyroidism    She has dropped levothyroxine to 76mcg daily - update TSH/fT4 today.       Relevant Medications   levothyroxine (SYNTHROID) 25 MCG tablet   Other Relevant Orders   TSH   T4, free   Sleep disturbance    Seeing neuropsych.       Incontinence of urine in female    UA today normal.  Describes stress > urge incontinence symptoms.  Discussed kegel exercises to strengthen pelvic floor.  Consider GYN eval to consider pessary.       Cognitive and neurobehavioral dysfunction - Primary    She has established with neuropsychology (Rodenbough) and is working on poor REM sleep with light therapy - going outdoors for sunset and sunrise every day.      Chronic fatigue    Continues supplementation as per med list. Will update fatigue labwork.       Relevant Orders   Vitamin B12   Ferritin   Basic metabolic panel    Vitamin B6   Attention deficit hyperactivity disorder (ADHD), predominantly inattentive type    Has stopped stimulants at this time with worsening ADHD symptoms, but with improvement in sleep      Anxiety disorder    Continues seeing psychiatry.        Other Visit Diagnoses    Urinary urgency       Relevant Orders   POCT Urinalysis Dipstick (Automated) (Completed)       No orders of the defined types were placed in this encounter.  Orders Placed This Encounter  Procedures  . Vitamin B12  . VITAMIN D 25 Hydroxy (Vit-D Deficiency, Fractures)  . Ferritin  . Basic metabolic panel  . TSH  . T4, free  . Vitamin B6  . POCT Urinalysis Dipstick (Automated)    Patient Instructions  Urine looking ok today. Limit bladder irritants such as spicy foods, refined sugars and caffeinated beverages.  I'd suggest limit water intake to 64 ounces a day especially if more water worsens stress symptoms.  Work on Kegel exercises to help stress incontinence symptoms.    Follow up plan: No follow-ups on file.  Ria Bush, MD

## 2020-02-29 NOTE — Assessment & Plan Note (Signed)
Seeing neuropsych.

## 2020-02-29 NOTE — Assessment & Plan Note (Signed)
Continues seeing psychiatry  

## 2020-02-29 NOTE — Assessment & Plan Note (Signed)
Continues supplementation as per med list. Will update fatigue labwork.

## 2020-02-29 NOTE — Assessment & Plan Note (Signed)
She has established with neuropsychology (Rodenbough) and is working on poor REM sleep with light therapy - going outdoors for sunset and sunrise every day.

## 2020-02-29 NOTE — Assessment & Plan Note (Signed)
Has stopped stimulants at this time with worsening ADHD symptoms, but with improvement in sleep

## 2020-02-29 NOTE — Assessment & Plan Note (Signed)
Update levels on 2 tablets of 5000 IU daily.

## 2020-02-29 NOTE — Assessment & Plan Note (Addendum)
UA today normal.  Describes stress > urge incontinence symptoms.  Discussed kegel exercises to strengthen pelvic floor.  Consider GYN eval to consider pessary.

## 2020-02-29 NOTE — Assessment & Plan Note (Addendum)
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

## 2020-02-29 NOTE — Assessment & Plan Note (Signed)
She has dropped levothyroxine to 27mcg daily - update TSH/fT4 today.

## 2020-03-01 ENCOUNTER — Other Ambulatory Visit: Payer: Self-pay | Admitting: Family Medicine

## 2020-03-01 ENCOUNTER — Other Ambulatory Visit: Payer: Managed Care, Other (non HMO)

## 2020-03-01 DIAGNOSIS — E559 Vitamin D deficiency, unspecified: Secondary | ICD-10-CM

## 2020-03-01 DIAGNOSIS — R5382 Chronic fatigue, unspecified: Secondary | ICD-10-CM

## 2020-03-01 DIAGNOSIS — G9332 Myalgic encephalomyelitis/chronic fatigue syndrome: Secondary | ICD-10-CM

## 2020-03-02 ENCOUNTER — Other Ambulatory Visit: Payer: Managed Care, Other (non HMO)

## 2020-03-02 DIAGNOSIS — G9332 Myalgic encephalomyelitis/chronic fatigue syndrome: Secondary | ICD-10-CM

## 2020-03-02 DIAGNOSIS — E559 Vitamin D deficiency, unspecified: Secondary | ICD-10-CM

## 2020-03-07 LAB — BASIC METABOLIC PANEL
BUN/Creatinine Ratio: 15 (ref 9–23)
BUN: 11 mg/dL (ref 6–24)
CO2: 25 mmol/L (ref 20–29)
Calcium: 9.5 mg/dL (ref 8.7–10.2)
Chloride: 101 mmol/L (ref 96–106)
Creatinine, Ser: 0.71 mg/dL (ref 0.57–1.00)
GFR calc Af Amer: 112 mL/min/{1.73_m2} (ref >59)
GFR calc non Af Amer: 97 mL/min/{1.73_m2} (ref >59)
Glucose: 116 mg/dL — ABNORMAL HIGH (ref 65–99)
Potassium: 4.5 mmol/L (ref 3.5–5.2)
Sodium: 140 mmol/L (ref 134–144)

## 2020-03-07 LAB — FERRITIN: Ferritin: 132 ng/mL (ref 15–150)

## 2020-03-07 LAB — TSH: TSH: 2.99 u[IU]/mL (ref 0.450–4.500)

## 2020-03-07 LAB — VITAMIN D 25 HYDROXY (VIT D DEFICIENCY, FRACTURES): Vit D, 25-Hydroxy: 43.7 ng/mL (ref 30.0–100.0)

## 2020-03-07 LAB — T4, FREE: Free T4: 0.99 ng/dL (ref 0.82–1.77)

## 2020-03-07 LAB — VITAMIN B12: Vitamin B-12: 774 pg/mL (ref 232–1245)

## 2020-03-07 LAB — VITAMIN B6: Vitamin B6: 38.2 ug/L — ABNORMAL HIGH (ref 2.0–32.8)

## 2020-03-28 ENCOUNTER — Other Ambulatory Visit: Payer: Self-pay

## 2020-03-28 ENCOUNTER — Encounter: Payer: Managed Care, Other (non HMO) | Attending: Psychology | Admitting: Psychology

## 2020-03-28 ENCOUNTER — Encounter: Payer: Self-pay | Admitting: Psychology

## 2020-03-28 DIAGNOSIS — R5382 Chronic fatigue, unspecified: Secondary | ICD-10-CM | POA: Diagnosis not present

## 2020-03-28 DIAGNOSIS — G479 Sleep disorder, unspecified: Secondary | ICD-10-CM | POA: Diagnosis not present

## 2020-03-28 DIAGNOSIS — F0789 Other personality and behavioral disorders due to known physiological condition: Secondary | ICD-10-CM | POA: Diagnosis present

## 2020-03-28 DIAGNOSIS — F09 Unspecified mental disorder due to known physiological condition: Secondary | ICD-10-CM | POA: Diagnosis present

## 2020-03-28 DIAGNOSIS — F4001 Agoraphobia with panic disorder: Secondary | ICD-10-CM | POA: Diagnosis not present

## 2020-03-28 DIAGNOSIS — F900 Attention-deficit hyperactivity disorder, predominantly inattentive type: Secondary | ICD-10-CM | POA: Diagnosis present

## 2020-03-28 NOTE — Progress Notes (Signed)
Neuropsychology Visit  Patient:  Susan Soto   DOB: 1966-06-22  MR Number: [id number]  Location: [facility] [place], [place] [id number] [place] [id number] Dept: [phone]  Date of Service: 03/28/2020  Start: 2 PM End: 3 PM  Duration of Service: 1 Hour  Today's visit was a 1 hour visit that was conducted in my outpatient clinic office with the patient, her husband, and myself present.  Provider/Observer:     Edgardo Roys PsyD  Chief Complaint:      Chief Complaint  Patient presents with  . Anxiety  . Panic Attack  . Sleeping Problem  . Pain  . Depression  . Other    attention and executive function deficits    Reason For Service:     Susan Soto is a 54 year old female referred by Daymon Larsen, MD with [facility].  The patient has a history of sleep disturbance with reports of cognitive disturbance and bilateral carpal tunnel syndrome.  The patient has been out of work after taking a medical leave in 2017 but then returning to work in a different job.  The patient has a history of major depressive disorder, adult residual attention deficit disorder and potentially residual cognitive difficulties after being diagnosed and treated for breast cancer in 2009 with both chemotherapy and radiation.  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  Patient reports that she was diagnosed with inattentive type attention deficit disorder in her early 64s and always had significant weakness keeping up with paperwork and documentation with her job.  The patient works as a Education officer, [employer].  The patient reports that she also had significant issues with major depressive disorder.  She reports that she took a medical  leave of absence in 2017 after developing significant exacerbation of her major depression with her mother passing away.  The patient returned to work after couple of months with an opportunity for a different job that had less demands and lower caseload.  Patient reports that she continued to have difficulty keeping up with any kind of paperwork and her previous strategies for dealing with that were not very helpful.  The patient reports that if she is not taking any medications that she just stays in bed, has no energy and significant difficulty with motivation.  The patient reports that this was true even when she was a young child.  The patient reports that she had an exacerbation of her difficulties after her chemotherapy treatment for breast cancer.  The patient reports that initially she experienced "chemo brain" but got better and then in 2017 her symptoms returned and she deteriorated.  The patient reports that typically she would have times of "hyperfocus" to get caught up now she cannot "hyperfocus" and has difficulty keeping up with paperwork.  The patient reports that she made improvements while out on FMLA after her mother and close aunt died.  The patient reports that she has seen a psychiatrist for 20 years in [place].  Patient reports being extremely stressed at work and feeling like she is extremely behind on her caseload and paperwork.  Her job has allowed her to start coming in at 10 AM to help her get better sleep.  Even with this adjustment the patient has difficulty getting into work before 11 AM.  The patient reports that she cant get her thoughts focused enough to do anything even at home.  She is trying to work long enough to retire early.   The patient is not sleeping well but no significant findings on sleep study beyond delayed REM onset.  The patient had been on extreme low fat diet for many years but has stopped this diet.    The patient now has sudden onset of anxiety  and panic attacks.    03/28/20 Update:  The patient returned for visit and brought the paperwork that she had forgotten in Feb.  The patient described a a decline in memory and executive functioning along with increasing anxiety and worsening of ADD/attention symptoms.  She describes new onset panic attacks within the past 6 months.  "pins and needles and pain in fingers described".  She describes a decline in her ability to perform tasks that involve concentration.  She continues to use Dexedrine for attention along with valium 1-2 times per day.  The patient had been out on FMLA due to major depresion and new onset of anxiety.  FMLA has run out and the patient has had to stop working but her short-term disability has denied her so far, attributing it to a pre-existing condition according to patient.  The patient describes severe insomnia (not OSA) but has had abnormalities on sleep study related to reduction in REM sleep.    Treatment Interventions:  Cognitive Behavioral and coping strategies along with continued history.  Participation Level:   Active  Participation Quality:  Appropriate and Redirectable      Behavioral Observation:  Well Groomed, Alert, and Appropriate.   Current Psychosocial Factors: The patient reports that she has been unable to work or do much due to her symptoms.  However, she reports that she has really worked on some of the sleep hygine issues we addressed and reports that her sleep time has improved to the point that she is sleep as much as 6 hours per day.  She has been getting up at sunrise and going outside and going outside at sunsets.  She reports that some of her anxiety/depressive symptoms have improved although they continue to be problematic.  She is to go back and see neurologist for follow-up.  This improvement has helped with her doing more and attending enough to pack her own luggage for a trip, something that she had not been able to do prior due to attention  deficits.    Content of Session:   Reviewed current symptoms and worked on coping and adjustment issues.    Effectiveness of Interventions: Patient is very motivated to improve and working hard on some of the challenging behavioral changes we have done around sleep hygiene.    Target Goals:   Improve sleep, work on anxiety and depression along with cognitive deficits.  Goals Last Reviewed:   03/28/2020  Goals Addressed Today:    Continued with sleep hygiene issues and concerns around cognitive deficits and coping.  Impression/Diagnosis:   Mertis Mosher is a 54 year old female referred by Daymon Larsen, MD with [facility].  The patient has a history of sleep disturbance with reports of cognitive disturbance and bilateral carpal tunnel syndrome.  The patient has been out of work after taking a medical leave in 2017 but then returning to work in a different job.  The patient has a history of major depressive disorder, adult residual attention deficit disorder and potentially residual cognitive difficulties after being diagnosed and treated  for breast cancer in 2009 with both chemotherapy and radiation.  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  Diagnosis:   Cognitive and neurobehavioral dysfunction  Sleep disturbance  Chronic fatigue  Panic disorder with agoraphobia    Ilean Skill, Psy.D. Clinical Psychologist Neuropsychologist

## 2020-04-11 ENCOUNTER — Encounter: Payer: Self-pay | Admitting: Psychology

## 2020-04-11 ENCOUNTER — Telehealth: Payer: Self-pay

## 2020-04-11 ENCOUNTER — Encounter (HOSPITAL_BASED_OUTPATIENT_CLINIC_OR_DEPARTMENT_OTHER): Payer: Managed Care, Other (non HMO) | Admitting: Psychology

## 2020-04-11 ENCOUNTER — Other Ambulatory Visit: Payer: Self-pay

## 2020-04-11 DIAGNOSIS — G479 Sleep disorder, unspecified: Secondary | ICD-10-CM | POA: Diagnosis not present

## 2020-04-11 DIAGNOSIS — R5382 Chronic fatigue, unspecified: Secondary | ICD-10-CM

## 2020-04-11 DIAGNOSIS — F4001 Agoraphobia with panic disorder: Secondary | ICD-10-CM | POA: Diagnosis not present

## 2020-04-11 DIAGNOSIS — F09 Unspecified mental disorder due to known physiological condition: Secondary | ICD-10-CM

## 2020-04-11 DIAGNOSIS — F0789 Other personality and behavioral disorders due to known physiological condition: Secondary | ICD-10-CM

## 2020-04-11 NOTE — Telephone Encounter (Signed)
Pt called and left a VM asking for an appt with either Dr. Brett Fairy or Jinny Blossom, NP.

## 2020-04-11 NOTE — Telephone Encounter (Signed)
appt made first available Dr. Brett Fairy 06-01-20 at 0830.

## 2020-04-11 NOTE — Progress Notes (Signed)
Neuropsychology Visit  Patient:  Susan Soto   DOB: 09-07-1966  MR Number: [id number]  Location: [facility] [place], [place] [id number] [place] [id number] Dept: [phone]  Date of Service: 04/11/2020  Start: 2 PM End: 3 PM  Duration of Service: 1 Hour  Today's visit was a 1 hour visit that was conducted in my outpatient clinic office with the patient and myself present.  Provider/Observer:     Edgardo Roys PsyD  Chief Complaint:      Chief Complaint  Patient presents with  . Anxiety  . Panic Attack  . Sleeping Problem  . Pain  . Other    Reason For Service:     Jayden Rudge is a 54 year old female referred by Daymon Larsen, MD with [facility].  The patient has a history of sleep disturbance with reports of cognitive disturbance and bilateral carpal tunnel syndrome.  The patient has been out of work after taking a medical leave in 2017 but then returning to work in a different job.  The patient has a history of major depressive disorder, adult residual attention deficit disorder and potentially residual cognitive difficulties after being diagnosed and treated for breast cancer in 2009 with both chemotherapy and radiation.  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  Patient reports that she was diagnosed with inattentive type attention deficit disorder in her early 49s and always had significant weakness keeping up with paperwork and documentation with her job.  The patient works as a Education officer, [employer].  The patient reports that she also had significant issues with major depressive disorder.  She reports that she took a medical leave of absence in 2017 after developing significant exacerbation of her  major depression with her mother passing away.  The patient returned to work after couple of months with an opportunity for a different job that had less demands and lower caseload.  Patient reports that she continued to have difficulty keeping up with any kind of paperwork and her previous strategies for dealing with that were not very helpful.  The patient reports that if she is not taking any medications that she just stays in bed, has no energy and significant difficulty with motivation.  The patient reports that this was true even when she was a young child.  The patient reports that she had an exacerbation of her difficulties after her chemotherapy treatment for breast cancer.  The patient reports that initially she experienced "chemo brain" but got better and then in 2017 her symptoms returned and she deteriorated.  The patient reports that typically she would have times of "hyperfocus" to get caught up now she cannot "hyperfocus" and has difficulty keeping up with paperwork.  The patient reports that she made improvements while out on FMLA after her mother and close aunt died.  The patient reports that she has seen a psychiatrist for 20 years in [place].  Patient reports being extremely stressed at work and feeling like she is extremely behind on her caseload and paperwork.  Her job has allowed her to start coming in at 10 AM to help her get better sleep.  Even with this adjustment the patient has difficulty getting into work before 11 AM.  The patient reports that she cant get her thoughts focused enough to  do anything even at home.  She is trying to work long enough to retire early.   The patient is not sleeping well but no significant findings on sleep study beyond delayed REM onset.  The patient had been on extreme low fat diet for many years but has stopped this diet.    The patient now has sudden onset of anxiety and panic attacks.    03/28/20 Update:  The patient returned for visit and  brought the paperwork that she had forgotten in Feb.  The patient described a a decline in memory and executive functioning along with increasing anxiety and worsening of ADD/attention symptoms.  She describes new onset panic attacks within the past 6 months.  "pins and needles and pain in fingers described".  She describes a decline in her ability to perform tasks that involve concentration.  She continues to use Dexedrine for attention along with valium 1-2 times per day.  The patient had been out on FMLA due to major depresion and new onset of anxiety.  FMLA has run out and the patient has had to stop working but her short-term disability has denied her so far, attributing it to a pre-existing condition according to patient.  The patient describes severe insomnia (not OSA) but has had abnormalities on sleep study related to reduction in REM sleep.    04/11/2020 update: The patient return for a visit today and reports that she has been having some improvements in her sleep pattern and there has been some improvements in her fatigue during the day.  She has reduced how much stimulants she is taking as well as taking less Valium.  The patient reports that she continues to have significant difficulties getting going in the morning but she has been going to bed earlier at night and getting up in the morning sooner and on many days she is able to get up at sunrise.  She has been following sleep hygiene efforts.  Treatment Interventions:  Cognitive Behavioral and coping strategies along with continued history.  Participation Level:   Active  Participation Quality:  Appropriate and Redirectable      Behavioral Observation:  Well Groomed, Alert, and Appropriate.   Current Psychosocial Factors: The patient continues to be unable to work and struggles keeping organized and completing task at home.  Content of Session:   Reviewed current symptoms and worked on coping and adjustment issues.    Effectiveness of  Interventions: Patient is very motivated to improve and working hard on some of the challenging behavioral changes we have done around sleep hygiene.    Target Goals:   Improve sleep, work on anxiety and depression along with cognitive deficits.  Goals Last Reviewed:   04/11/2020  Goals Addressed Today:    Continued with sleep hygiene issues and concerns around cognitive deficits and coping.  Impression/Diagnosis:   Bernardina Cacho is a 54 year old female referred by Daymon Larsen, MD with [facility].  The patient has a history of sleep disturbance with reports of cognitive disturbance and bilateral carpal tunnel syndrome.  The patient has been out of work after taking a medical leave in 2017 but then returning to work in a different job.  The patient has a history of major depressive disorder, adult residual attention deficit disorder and potentially residual cognitive difficulties after being diagnosed and treated for breast cancer in 2009 with both chemotherapy and radiation.  The patient has had a recent sleep study that identified potentially medication induced reduced latency of REM  sleep but no indication of other significant organically induced sleep disorder.  The patient continues with significant anxiety, pain and sleep disturbance.  The patient has been actively working on therapeutic interventions today we worked on some very practical strategies for aiding her free recall of information and strategies for completing tasks.  Diagnosis:   Cognitive and neurobehavioral dysfunction  Sleep disturbance  Chronic fatigue  Panic disorder with agoraphobia    Ilean Skill, Psy.D. Clinical Psychologist Neuropsychologist

## 2020-04-25 ENCOUNTER — Other Ambulatory Visit: Payer: Self-pay | Admitting: Internal Medicine

## 2020-04-26 ENCOUNTER — Other Ambulatory Visit: Payer: Self-pay

## 2020-04-26 ENCOUNTER — Encounter: Payer: Managed Care, Other (non HMO) | Attending: Psychology | Admitting: Psychology

## 2020-04-26 DIAGNOSIS — F900 Attention-deficit hyperactivity disorder, predominantly inattentive type: Secondary | ICD-10-CM | POA: Insufficient documentation

## 2020-04-26 DIAGNOSIS — F0789 Other personality and behavioral disorders due to known physiological condition: Secondary | ICD-10-CM | POA: Diagnosis present

## 2020-04-26 DIAGNOSIS — G479 Sleep disorder, unspecified: Secondary | ICD-10-CM | POA: Diagnosis not present

## 2020-04-26 DIAGNOSIS — F09 Unspecified mental disorder due to known physiological condition: Secondary | ICD-10-CM | POA: Diagnosis not present

## 2020-04-26 DIAGNOSIS — F4001 Agoraphobia with panic disorder: Secondary | ICD-10-CM | POA: Diagnosis not present

## 2020-04-26 DIAGNOSIS — F411 Generalized anxiety disorder: Secondary | ICD-10-CM

## 2020-04-26 DIAGNOSIS — R5382 Chronic fatigue, unspecified: Secondary | ICD-10-CM | POA: Diagnosis not present

## 2020-04-28 ENCOUNTER — Encounter: Payer: Self-pay | Admitting: Psychology

## 2020-04-28 NOTE — Progress Notes (Signed)
Neuropsychology Visit  Patient:  Susan Soto   DOB: [birth year]  MR Number: [id number]  Location: [facility] [place], [place] [id number] [place] [id number] Dept: [phone]  Date of Service: 04/26/2020  Start: 11 AM End: 12 PM  Duration of Service: 1 Hour  Today's visit was a 1 hour visit that was conducted in my outpatient clinic office with the patient and myself present.  Provider/Observer:     Edgardo Roys PsyD  Chief Complaint:      Chief Complaint  Patient presents with  . Anxiety  . Panic Attack  . Sleeping Problem  . Pain  . Other    Reason For Service:     Susan Soto is a 54 year old female referred by Daymon Larsen, MD with [facility].  The patient has a history of sleep disturbance with reports of cognitive disturbance and bilateral carpal tunnel syndrome.  The patient has been out of work after taking a medical leave in 2017 but then returning to work in a different job.  The patient has a history of major depressive disorder, adult residual attention deficit disorder and potentially residual cognitive difficulties after being diagnosed and treated for breast cancer in 2009 with both chemotherapy and radiation.  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  Patient reports that she was diagnosed with inattentive type attention deficit disorder in her early 11s and always had significant weakness keeping up with paperwork and documentation with her job.  The patient works as a Education officer, [employer].  The patient reports that she also had significant issues with major depressive disorder.  She reports that she took a medical leave of absence in 2017 after developing significant exacerbation of her  major depression with her mother passing away.  The patient returned to work after couple of months with an opportunity for a different job that had less demands and lower caseload.  Patient reports that she continued to have difficulty keeping up with any kind of paperwork and her previous strategies for dealing with that were not very helpful.  The patient reports that if she is not taking any medications that she just stays in bed, has no energy and significant difficulty with motivation.  The patient reports that this was true even when she was a young child.  The patient reports that she had an exacerbation of her difficulties after her chemotherapy treatment for breast cancer.  The patient reports that initially she experienced "chemo brain" but got better and then in 2017 her symptoms returned and she deteriorated.  The patient reports that typically she would have times of "hyperfocus" to get caught up now she cannot "hyperfocus" and has difficulty keeping up with paperwork.  The patient reports that she made improvements while out on FMLA after her mother and close aunt died.  The patient reports that she has seen a psychiatrist for 20 years in [place].  Patient reports being extremely stressed at work and feeling like she is extremely behind on her caseload and paperwork.  Her job has allowed her to start coming in at 10 AM to help her get better sleep.  Even with this adjustment the patient has difficulty getting into work before 11 AM.  The patient reports that she cant get her thoughts focused enough to  do anything even at home.  She is trying to work long enough to retire early.   The patient is not sleeping well but no significant findings on sleep study beyond delayed REM onset.  The patient had been on extreme low fat diet for many years but has stopped this diet.    The patient now has sudden onset of anxiety and panic attacks.    03/28/20 Update:  The patient returned for visit and  brought the paperwork that she had forgotten in Feb.  The patient described a a decline in memory and executive functioning along with increasing anxiety and worsening of ADD/attention symptoms.  She describes new onset panic attacks within the past 6 months.  "pins and needles and pain in fingers described".  She describes a decline in her ability to perform tasks that involve concentration.  She continues to use Dexedrine for attention along with valium 1-2 times per day.  The patient had been out on FMLA due to major depresion and new onset of anxiety.  FMLA has run out and the patient has had to stop working but her short-term disability has denied her so far, attributing it to a pre-existing condition according to patient.  The patient describes severe insomnia (not OSA) but has had abnormalities on sleep study related to reduction in REM sleep.    04/11/2020 update: The patient return for a visit today and reports that she has been having some improvements in her sleep pattern and there has been some improvements in her fatigue during the day.  She has reduced how much stimulants she is taking as well as taking less Valium.  The patient reports that she continues to have significant difficulties getting going in the morning but she has been going to bed earlier at night and getting up in the morning sooner and on many days she is able to get up at sunrise.  She has been following sleep hygiene efforts.  Treatment Interventions:  Cognitive Behavioral and coping strategies along with continued history.  Participation Level:   Active  Participation Quality:  Appropriate and Redirectable      Behavioral Observation:  Well Groomed, Alert, and Appropriate.   Current Psychosocial Factors: The patient is continues to struggle with managing even basic tasks at home.  She is not getting a lot of help from her husband and there are continued expectations from her husband and children that she not only manage  her own self but that she takes care of their needs without much help on their part.  The patient continues to struggle over these issues.  The patient reports that she is continued to have anxiety and panic and severe issues with attention and concentration issues.  Content of Session:   Reviewed current symptoms and worked on coping and adjustment issues.    Effectiveness of Interventions: Patient is very motivated to improve and working hard on some of the challenging behavioral changes we have done around sleep hygiene.    Target Goals:   Improve sleep, work on anxiety and depression along with cognitive deficits.  Goals Last Reviewed:   04/26/2020  Goals Addressed Today:    Continued with sleep hygiene issues and concerns around cognitive deficits and coping.  Impression/Diagnosis:   Shatima Zalar is a 54 year old female referred by Daymon Larsen, MD with [facility].  The patient has a history of sleep disturbance with reports of cognitive disturbance and bilateral carpal tunnel syndrome.  The patient has been out of  work after taking a medical leave in 2017 but then returning to work in a different job.  The patient has a history of major depressive disorder, adult residual attention deficit disorder and potentially residual cognitive difficulties after being diagnosed and treated for breast cancer in 2009 with both chemotherapy and radiation.  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  The patient continues with significant anxiety, pain and sleep disturbance.  The patient has been actively working on therapeutic interventions today we worked on some very practical strategies for aiding her free recall of information and strategies for completing tasks.  Diagnosis:   Cognitive and neurobehavioral dysfunction  Sleep  disturbance  Chronic fatigue  Panic disorder with agoraphobia  Generalized anxiety disorder  Panic disorder with agoraphobia and severe panic attacks    Ilean Skill, Psy.D. Clinical Psychologist Neuropsychologist

## 2020-05-09 ENCOUNTER — Ambulatory Visit: Payer: Managed Care, Other (non HMO) | Admitting: Psychology

## 2020-05-10 ENCOUNTER — Other Ambulatory Visit: Payer: Self-pay | Admitting: Internal Medicine

## 2020-05-26 ENCOUNTER — Other Ambulatory Visit: Payer: Self-pay

## 2020-05-26 ENCOUNTER — Encounter: Payer: Managed Care, Other (non HMO) | Attending: Psychology | Admitting: Psychology

## 2020-05-26 DIAGNOSIS — F0789 Other personality and behavioral disorders due to known physiological condition: Secondary | ICD-10-CM

## 2020-05-26 DIAGNOSIS — R5382 Chronic fatigue, unspecified: Secondary | ICD-10-CM

## 2020-05-26 DIAGNOSIS — F4001 Agoraphobia with panic disorder: Secondary | ICD-10-CM | POA: Diagnosis not present

## 2020-05-26 DIAGNOSIS — F09 Unspecified mental disorder due to known physiological condition: Secondary | ICD-10-CM

## 2020-05-26 DIAGNOSIS — F900 Attention-deficit hyperactivity disorder, predominantly inattentive type: Secondary | ICD-10-CM | POA: Diagnosis present

## 2020-05-26 DIAGNOSIS — G479 Sleep disorder, unspecified: Secondary | ICD-10-CM

## 2020-05-26 DIAGNOSIS — F411 Generalized anxiety disorder: Secondary | ICD-10-CM

## 2020-06-01 ENCOUNTER — Ambulatory Visit (INDEPENDENT_AMBULATORY_CARE_PROVIDER_SITE_OTHER): Payer: Managed Care, Other (non HMO) | Admitting: Neurology

## 2020-06-01 ENCOUNTER — Encounter: Payer: Self-pay | Admitting: Neurology

## 2020-06-01 VITALS — BP 129/81 | HR 91 | Ht 65.0 in | Wt 180.0 lb

## 2020-06-01 DIAGNOSIS — G4721 Circadian rhythm sleep disorder, delayed sleep phase type: Secondary | ICD-10-CM | POA: Diagnosis not present

## 2020-06-01 NOTE — Progress Notes (Signed)
SLEEP MEDICINE CLINIC    Provider:  Larey Seat, MD  Primary Care Physician:  Susan Bush, MD [place] [address]     Referring Provider: Ria Soto, [address] [place],  [place] [address]          Chief Complaint according to patient   Patient presents with:    .  Patient (Re- Visit)     pt alone, rm 10, presents today for a follow up visit. she has followed with Susan Soto, she states the excercises he has her doing is helping. he wanted her to follow back up here in regards to Sleep study results. Delayed sleep phase.       HISTORY OF PRESENT ILLNESS:  Susan Soto is a 54 y.o. year old Caucasian female patient seen here on 06/01/2020 in a RV. She had a previous video visit with Susan Soto . She stated today that she can not remember any of the conversation, and is not a "my chart " person.  She also stated she couldn't connect from hme, her husbands job "needs all the bandwidth in the home".  She reportedly comes to visit here today upon Susan Soto request to follow up. She was told that her medication may interfere with sleep continuity. She also had a lot of hand pain and the sleep lab was very noisy.  She reports that people were talking all night in front of her door- and no noise machine or fan was used.  She was also upset about being woken at 5 AM, as a person that went to bed at 2-4 AM. Susan Soto concentrated on her ADHD medication ( it was reduced to allow better sleep at night)  and disorganized sleep rhythm, she was asked to expose herself to daylight, started vitamin B 6, increased her vit D level. She is interested in natural ways to treat her carpal tunnel, wears braces and takes CURCUMA. She established a bed time at 10 Pm and is up by sunrise.   She feels she has improved her fatigue level. She retired early as she couldn't go back to work after Susan Soto. She was approved for disability retirement.  She is under the expectation that our sleep study needs to support her claim for STD.  Today 10/14/19, MM, NP.  Susan Soto is a 54 year old female with a history of sleep disturbance, cognitive disturbance and bilateral carpal tunnel syndrome.  She joins me today for virtual visit.  She reports that over the last 5 months she has noticed some improvement in her symptoms however she feels that her improvement is not enough for her to go back to work.  She states that she still feels tired the next day although being out of work she is able to sleep more.  She has questions regarding her sleep study.  Sleep study did show that she was weight of REM sleep but did not identify any causes other than medication.  The patient clarifies that she did not take her ADD medication that day.  She currently is taking melatonin 5 mg at bedtime.  She is using wrist splints for her carpal tunnel.  She has an appointment set up in February with neuropsychiatry.  She states that she has reduced her ADD medication.  She takes 30 mg daily and on occasion she may take 10 to 20 mg in the afternoon if needed.  She reports that she used to take double  this dose when she was working.  She returns today for an evaluation.   Susan Susan Soto referral , 07-02-2019.Chief concern according to patient : " pt states that she has difficulty with sleeping during the night. she has been doing that for several months. even if she goes to bed at normal time or if she sleeps 8-10 hrs she will still wake up tired. + snores. lasy SS over 15 yrs. She also reported " a hard time with brain fog " for which her Psychiatrist , Susan Susan Soto, has taken her out of work- FMLA for over 2 month? . She believes brain fog and ADHD started with breast cancer chemotherapy.  She reports having an easy case load and still unable to do her job.     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    Sleep relevant medical history: it is difficult to get her history and symptoms in an arranged time frame. She has been on ADD meds. She is fatigued all her life, andmany medications have caused drowsiness when labeled as not drowsy formula. Irritability, cussing, anger, depression, anxiety.  She was diagnosed with hypothyroidism, and low coeruloplasmin, copper / zinc imbalance ( naturopath /)  Still sees psychiatrist  Susan Soto., MD   Family medical Susan Soto history: Mother is only family member with OSA, insomnia,  brother is a  sleep walkers. " everybody has" bruxism. Brothers all with ADHD, mom had ADD.    Social history: patient worked in [employer], elder care, [employer] graduate and Masters in social work degree and in fine arts/ fashion.   Patient is currently on FMLA , but usually working at [employer], and lives in a household with 2 persons- but her son Susan Soto, had to return home e with his fiancee during [place]. 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 Susan Soto is a Ship broker.  Family status is married , with 2 adult children.  The patient currently works daytime.  Tobacco use; none .  ETOH use ; none. Caffeine intake in form of Coffee( yes- 2 mugs  in AM )  Soda( none ) Tea ( some afternoons) or energy drinks.    Sleep habits are as follows: The patient's lunch time is 2-3 PM dinner time is between 8.30- 9 PM. The patient goes to bed at  12 PM - 2 AM, and continues to sleeping until  10-11 AM , wakes rarely  bathroom breaks.   The preferred sleep position is prone or side , with the support of my- pillow.  Dreams are reportedly frequent/vivid .  Before FMLA 8 AM was the usual rise time. She was always late, The patient wakes up with an alarm.  She hits the snooze button.  She reports not feeling refreshed or restored in AM, with symptoms such as dry mouth , no morning headaches, but always with residual fatigue.  Naps are taken infrequently, she struggles to not fall asleep.     Review of Systems: Out of a complete 14 system review, the patient complains of only the following  symptoms, and all other reviewed systems are negative.:   she feels stressed and reacts with being paralyzed, can't get things done, feels always overwhelmed.  There are other household members that contribute, Cody's return to the parental home, and her husbands hoarding.     Unfocussed, not -concentrated, disorganized, and a lot of procrastination.  anxiety. Fatigue, sleepiness  With delayed sleep cycle, sleep deprivation.      How likely are you to doze in the following situations: 0 = not likely, 1 = slight chance, 2 = moderate chance, 3 = high chance   Sitting and Reading? Watching Television? Sitting inactive in a public place (theater or meeting)? As a passenger in a car for an hour without a break? Lying down in the afternoon when circumstances permit? Sitting and talking to someone? Sitting quietly after lunch without alcohol? In a car, while stopped for a few minutes in traffic?   Total = 7/ 24 points   FSS endorsed at 63/ 63 points. (!)   Social History   Socioeconomic History  . Marital status: Married    Spouse name: Not on file  . Number of children: Not on file  . Years of education: Not on file  . Highest education level: Not on file  Occupational History  . Occupation: Education officer, museum  Tobacco Use  . Smoking status: Never Smoker  . Smokeless tobacco: Never Used   Vaping Use  . Vaping Use: Never used  Substance and Sexual Activity  . Alcohol use: No  . Drug use: No  . Sexual activity: Not on file  Other Topics Concern  . Not on file  Social History Narrative   Caffeine: 1 cup coffee in am   Lives with Susan Soto and 1 son, daughter in college, 1 dog and 1 cat, 5-6 outside cats   Occupation: Camera operator job   Activity: no regular activity   Diet: joined [employer], good water, fruits/vegetables daily, red meat 3x/wk, fish 2x/wk      Psych- Susan. [employer] in [place] (ph [phone])   Social Determinants of Health   Financial Resource Strain:   . Difficulty of Paying Living Expenses:   Food Insecurity:   . Worried About Charity fundraiser in the Last Year:   . Arboriculturist in the Last Year:   Transportation Needs:   . Film/video editor (Medical):   Marland Kitchen Lack of Transportation (Non-Medical):   Physical Activity:   . Days of Exercise per Week:   . Minutes of Exercise per Session:   Stress:   . Feeling of Stress :   Social Connections:   . Frequency of Communication with Friends and Family:   . Frequency of Social Gatherings with Friends and Family:   . Attends Religious Services:   . Active Member of Clubs or Organizations:   . Attends Archivist Meetings:   Marland Kitchen Marital Status:     Family History  Problem Relation Age of Onset  . Drug abuse Father        Heroin  . Hypertension Mother   . Fibromyalgia Mother   . ADD / ADHD Mother   . Depression Mother   . Cancer Mother        cervical, bladder, non melanoma skin  . Colon polyps Mother        Hyperplastic  . ADD / ADHD Brother   . Anxiety disorder Brother   . Stroke Maternal Grandmother   . Breast cancer Maternal  Grandmother        79's  . Hypertension Paternal Grandmother   . Cancer Paternal Grandfather        lung  . Hypothyroidism Maternal Aunt   . Cancer Maternal Aunt        vulvar, cervical, non melanoma skin  . Hypothyroidism Maternal  Uncle   . Cancer Maternal Aunt        cervical, lung, precancerous colon polyps  . Diabetes Neg Hx   . Coronary artery disease Neg Hx     Past Medical History:  Diagnosis Date  . ADD (attention deficit disorder) 1990s  . Anxiety   . Bilateral carpal tunnel syndrome 08/10/2019  . BRCA negative 05/20/2017   MyRisk neg  . 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   . Genetic testing of female 05/2017  . Hemorrhoids   . History of breast cancer 2009   Left side, lumpectomy, s/p sentinal LN biopsy, chemo and radiation, remission  . 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 T1c, N0  . Personal history of chemotherapy 2009   left breast ca  . Personal history of radiation therapy 2009   left breast ca  . PONV (postoperative nausea and vomiting)   . Subclinical hypothyroidism   . Syncope and collapse     Past Surgical History:  Procedure Laterality Date  . bartholin's cystectomy    . BREAST BIOPSY Left 05/07/08   Left (infitr ductal CA)- Susan. Bary Castilla  . BREAST BIOPSY Left 06/25/13   left (benign) atrophic ductal epithelium, microcalcifications, changes of previous surgery.  Marland Kitchen BREAST LUMPECTOMY Left 05/27/2008   invasive ductal carcinoma  . breast wide excision  05/27/08   Left-with sentinel node bx (Susan. Bary Castilla)  . COLONOSCOPY  07/2009   WNL ([place])  . COLONOSCOPY WITH PROPOFOL N/A 10/06/2019   Procedure: COLONOSCOPY WITH PROPOFOL;  Surgeon: Lucilla Lame, MD;  Location: [facility] ENDOSCOPY;  Service: Endoscopy;  Laterality: N/A;  . CYSTOSCOPY    . episiotomy       Current Outpatient Medications on File Prior to Visit  Medication Sig Dispense Refill  . Ascorbic Acid (VITAMIN C) POWD Take 1,000 mg by mouth daily.    . ASHWAGANDHA PO Take 375 mg by mouth at bedtime.    . Cholecalciferol (VITAMIN D-3) 5000 UNITS TABS  Take 2 tablets by mouth daily.     . COLLAGEN PO Take 1 tablet by mouth daily.    Marland Kitchen dextroamphetamine (DEXTROSTAT) 10 MG tablet Take 10 mg by mouth daily. TAKE 1 TABLETS (10 MG) ONCE DAILY AT LUNCHTIME. Can take 1-2 tablets in the afternoon if needed.  0  . diazepam (VALIUM) 2 MG tablet Take 2 mg by mouth every 12 (twelve) hours as needed for anxiety (bladder retention).     Mariane Baumgarten Calcium (STOOL SOFTENER PO) Take by mouth as needed.    . medium chain triglycerides (MCT OIL) oil Take by mouth 3 (three) times daily. Takes 3 TBSP daily    . Multiple Vitamins-Minerals (ANTIOXIDANT FORMULA SG) capsule Take 1 capsule by mouth daily. Powder daily    . NON FORMULARY Vitamins ADEK    . Omega-3 Fatty Acids (OMEGA 3 PO) Take 690 mg by mouth daily. Nordic naturals    . OVER THE COUNTER MEDICATION 2 capsules daily. KETO- metabolic ketosis support    . Probiotic Product (PROBIOTIC DAILY  PO) Take by mouth daily.    Marland Kitchen lamoTRIgine 50 MG TBDP Take 50 mg by mouth daily. Takes 2 tablets once daily    . levothyroxine (SYNTHROID) 50 MCG tablet TAKE 1 TABLET BY MOUTH EVERY DAY 30 tablet 0  . TURMERIC PO Take 1 tablet by mouth daily.     . valACYclovir (VALTREX) 1000 MG tablet Take 2 tablets (2,000 mg total) by mouth 2 (two) times daily. x1 day (Patient taking differently: Take 2,000 mg by mouth daily as needed. As needed for fever blisters) 20 tablet 1   No current facility-administered medications on file prior to visit.    Allergies  Allergen Reactions  . Bupropion Hcl     REACTION: Hives  . Buspar [Buspirone Hcl] Nausea And Vomiting  . Other Other (See Comments)    Dissolving sutures Surgical glue bandaids   . Strattera [Atomoxetine Hcl] Other (See Comments)    Urinary trouble  . Tegaderm Ag Mesh [Silver]     Physical exam:  Today's Vitals   06/01/20 0839  BP: 129/81  Pulse: 91  Weight: 180 lb (81.6 kg)  Height: 5' 5"  (1.651 m)   Body mass index is 29.95 kg/m.   Wt Readings from Last 3  Encounters:  06/01/20 180 lb (81.6 kg)  02/26/20 178 lb 9 oz (81 kg)  10/06/19 173 lb 5.6 oz (78.6 kg)     Ht Readings from Last 3 Encounters:  06/01/20 5' 5"  (1.651 m)  02/26/20 5' 5"  (1.651 m)  10/06/19 5' 5"  (1.651 m)      General: The patient is awake, alert and appears in acute distress. The patient is well groomed. Head: Normocephalic, atraumatic.  Neck is supple. Mallampati 1  neck circumference: 14  inches . Nasal airflow  patent.  Retrognathia is not seen.  Dental status: intact, cheek biting marks.  Cardiovascular:  Regular rate and cardiac rhythm by pulse, without distended neck veins. Respiratory: Lungs are clear to auscultation.  Skin:  Without evidence of ankle edema, or rash. Trunk: The patient's posture is erect.   Neurologic exam : The patient is awake and alert, oriented to place and time.   Memory subjective described as severely impaired, inattentive.  Attention span & concentration ability appears impaired Speech is fluent,  without  dysarthria, dysphonia or aphasia.  Mood and affect are " pressured"   Cranial nerves: no loss of smell or taste reported  Pupils are equal and briskly reactive to light. Extraocular movements intact and without nystagmus. No Diplopia. Visual fields by finger perimetry are intact.  Facial motor strength is symmetric and tongue and uvula move midline.  Neck ROM : rotation, tilt and flexion extension were normal for age and shoulder shrug was symmetrical.    Motor exam:  Symmetric bulk, tone and ROM.   Normal tone without cog wheeling, symmetric grip strength . She reports feeling less strength on the right hand, grip is affected by carpal tunnel- .    Coordination: Rapid alternating movements in the fingers/hands were of normal speed.    Toe and heel walk were deferred.  Deep tendon reflexes: in the  upper and lower extremities are symmetric and intact.  Babinski response was normal.    After spending a total time of   24mnutes face to face and additional time for physical and neurologic examination, review of laboratory studies,  personal review of imaging studies, reports and results of other testing and review of referral information / records as far as provided in  visit, I have established the following assessments:  [facility]  nurse will add the MMSE/ [place] today.   1)  The patient presents with a long standing excessive fatigue, and Susan R. diagnosed again  depression/ anxiety / panic disorder   2)  She has uncontrolled ADD- not a frontal dementia. and there is some pressured speech and reportedly some anger problems,  3) no organic sleep dysfunction , no OSA, no PLMs but medication induced changes in REM latency, REM supresson- sleep quality has improved.   My Plan is to proceed with:  I would like to thank  Susan Soto, [place],  [place] [id number] for allowing me to meet with and to take care of this pleasant patient.   In short, Susan Soto is presenting with many symptoms that can be most likeley related to psychiatric underlying conditions. I suspect that her ADD is in effect not treated , not controlled.  It doesn't help with concentration or focus.  I have heard of brain fog after chemo therapy, but not with this delay.  Short term disability claim - I am not sure how to support this . Her condition was existing for years, and stress in her life made it worse- delayed sleep phase.   The patient may follow up r through our NP prn.  CC: I will share my notes with  Psychiatry  Electronically signed by: Susan Seat, MD 06/01/2020 8:55 AM  [facility] Board certified by The [employer] of Sleep Medicine and Diplomate of the [employer] of Sleep Medicine. Board certified In Neurology through the [place], Fellow of the [employer] of Neurology. Medical Director of [facility].

## 2020-06-01 NOTE — Patient Instructions (Signed)

## 2020-06-13 ENCOUNTER — Encounter: Payer: Self-pay | Admitting: Psychology

## 2020-06-13 NOTE — Progress Notes (Signed)
Neuropsychology Visit  Patient:  Susan Soto   DOB: 23-Jan-1966  MR Number: [id number]  Location: [facility] [place], [place] [id number] [place] [id number] Dept: [phone]  Date of Service: 05/26/2020  Start: 10 AM End: 11 AM  Duration of Service: 1 Hour  Today's visit was a 1 hour visit that was conducted in my outpatient clinic office with the patient and myself present.  Provider/Observer:     Edgardo Roys PsyD  Chief Complaint:      Chief Complaint  Patient presents with  . Anxiety  . Panic Attack  . ADHD  . Pain  . Sleeping Problem    Reason For Service:     Susan Soto is a 54 year old female referred by Daymon Larsen, MD with [facility].  The patient has a history of sleep disturbance with reports of cognitive disturbance and bilateral carpal tunnel syndrome.  The patient has been out of work after taking a medical leave in 2017 but then returning to work in a different job.  The patient has a history of major depressive disorder, adult residual attention deficit disorder and potentially residual cognitive difficulties after being diagnosed and treated for breast cancer in 2009 with both chemotherapy and radiation.  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  Patient reports that she was diagnosed with inattentive type attention deficit disorder in her early 70s and always had significant weakness keeping up with paperwork and documentation with her job.  The patient works as a Education officer, [employer].  The patient reports that she also had significant issues with major depressive disorder.  She reports that she took a medical leave of absence in 2017 after developing significant exacerbation of her  major depression with her mother passing away.  The patient returned to work after couple of months with an opportunity for a different job that had less demands and lower caseload.  Patient reports that she continued to have difficulty keeping up with any kind of paperwork and her previous strategies for dealing with that were not very helpful.  The patient reports that if she is not taking any medications that she just stays in bed, has no energy and significant difficulty with motivation.  The patient reports that this was true even when she was a young child.  The patient reports that she had an exacerbation of her difficulties after her chemotherapy treatment for breast cancer.  The patient reports that initially she experienced "chemo brain" but got better and then in 2017 her symptoms returned and she deteriorated.  The patient reports that typically she would have times of "hyperfocus" to get caught up now she cannot "hyperfocus" and has difficulty keeping up with paperwork.  The patient reports that she made improvements while out on FMLA after her mother and close aunt died.  The patient reports that she has seen a psychiatrist for 20 years in [place].  Patient reports being extremely stressed at work and feeling like she is extremely behind on her caseload and paperwork.  Her job has allowed her to start coming in at 10 AM to help her get better sleep.  Even with this adjustment the patient has difficulty getting into work before 11 AM.  The patient reports that she cant get her thoughts focused enough to  do anything even at home.  She is trying to work long enough to retire early.   The patient is not sleeping well but no significant findings on sleep study beyond delayed REM onset.  The patient had been on extreme low fat diet for many years but has stopped this diet.    The patient now has sudden onset of anxiety and panic attacks.    03/28/20 Update:  The patient returned for visit and  brought the paperwork that she had forgotten in Feb.  The patient described a a decline in memory and executive functioning along with increasing anxiety and worsening of ADD/attention symptoms.  She describes new onset panic attacks within the past 6 months.  "pins and needles and pain in fingers described".  She describes a decline in her ability to perform tasks that involve concentration.  She continues to use Dexedrine for attention along with valium 1-2 times per day.  The patient had been out on FMLA due to major depresion and new onset of anxiety.  FMLA has run out and the patient has had to stop working but her short-term disability has denied her so far, attributing it to a pre-existing condition according to patient.  The patient describes severe insomnia (not OSA) but has had abnormalities on sleep study related to reduction in REM sleep.    04/11/2020 update: The patient return for a visit today and reports that she has been having some improvements in her sleep pattern and there has been some improvements in her fatigue during the day.  She has reduced how much stimulants she is taking as well as taking less Valium.  The patient reports that she continues to have significant difficulties getting going in the morning but she has been going to bed earlier at night and getting up in the morning sooner and on many days she is able to get up at sunrise.  She has been following sleep hygiene efforts.  05/26/2020: The patient reports that she is continued to have some improvements but continues with significant attentional issues and disturbed sleep.  The patient is continued with the lower dose of psychostimulant medications but this has a negative impact on her attention and concentration but does seem to help with her anxiety symptoms.  The patient has continued to work on sleep hygiene issues and has improved somewhat and will be following up with her neurologist soon.  Treatment  Interventions:  Cognitive Behavioral and coping strategies along with continued history.  Participation Level:   Active  Participation Quality:  Appropriate and Redirectable      Behavioral Observation:  Well Groomed, Alert, and Appropriate.   Current Psychosocial Factors: The patient reports that she continues struggling with basic tasks around the house and feels like she is not getting a lot of help from other family members although she realizes that she struggles primarily with her own symptoms.  Content of Session:   Reviewed current symptoms and worked on coping and adjustment issues.    Effectiveness of Interventions: Patient is very motivated to improve and working hard on some of the challenging behavioral changes we have done around sleep hygiene.    Target Goals:   Improve sleep, work on anxiety and depression along with cognitive deficits.  Goals Last Reviewed:   05/26/2020  Goals Addressed Today:    Continued with sleep hygiene issues and concerns around cognitive deficits and coping.  Impression/Diagnosis:   Susan Soto is a 54 year old female referred by Daymon Larsen,  MD with [facility].  The patient has a history of sleep disturbance with reports of cognitive disturbance and bilateral carpal tunnel syndrome.  The patient has been out of work after taking a medical leave in 2017 but then returning to work in a different job.  The patient has a history of major depressive disorder, adult residual attention deficit disorder and potentially residual cognitive difficulties after being diagnosed and treated for breast cancer in 2009 with both chemotherapy and radiation.  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  The patient continues with significant anxiety, pain and sleep disturbance.  The patient has been actively  working on therapeutic interventions today we worked on some very practical strategies for aiding her free recall of information and strategies for completing tasks.  Diagnosis:   Cognitive and neurobehavioral dysfunction  Sleep disturbance  Chronic fatigue  Panic disorder with agoraphobia  Generalized anxiety disorder  Attention deficit hyperactivity disorder (ADHD), predominantly inattentive type    Ilean Skill, Psy.D. Clinical Psychologist Neuropsychologist

## 2020-06-20 ENCOUNTER — Other Ambulatory Visit: Payer: Self-pay

## 2020-06-20 ENCOUNTER — Encounter (HOSPITAL_BASED_OUTPATIENT_CLINIC_OR_DEPARTMENT_OTHER): Payer: Managed Care, Other (non HMO) | Admitting: Psychology

## 2020-06-20 DIAGNOSIS — F0789 Other personality and behavioral disorders due to known physiological condition: Secondary | ICD-10-CM | POA: Diagnosis not present

## 2020-06-20 DIAGNOSIS — F09 Unspecified mental disorder due to known physiological condition: Secondary | ICD-10-CM | POA: Diagnosis not present

## 2020-06-20 DIAGNOSIS — F411 Generalized anxiety disorder: Secondary | ICD-10-CM

## 2020-06-20 DIAGNOSIS — F900 Attention-deficit hyperactivity disorder, predominantly inattentive type: Secondary | ICD-10-CM

## 2020-06-20 DIAGNOSIS — G3184 Mild cognitive impairment, so stated: Secondary | ICD-10-CM

## 2020-06-20 DIAGNOSIS — F4001 Agoraphobia with panic disorder: Secondary | ICD-10-CM

## 2020-06-20 DIAGNOSIS — G479 Sleep disorder, unspecified: Secondary | ICD-10-CM

## 2020-06-20 DIAGNOSIS — R5382 Chronic fatigue, unspecified: Secondary | ICD-10-CM

## 2020-06-21 ENCOUNTER — Encounter: Payer: Self-pay | Admitting: Psychology

## 2020-06-21 NOTE — Progress Notes (Signed)
Neuropsychology Visit  Patient:  Susan Soto   DOB: 09/18/1966  MR Number: [id number]  Location: [facility] [place], [place] [id number] [place] [id number] Dept: [phone]  Date of Service: 06/20/2020  Start: 3 PM End: 4 PM  Duration of Service: 1 Hour  Today's visit was a 1 hour visit that was conducted in my outpatient clinic office with the patient and myself present.  Provider/Observer:     Susan Roys PsyD  Chief Complaint:      Chief Complaint  Patient presents with  . Anxiety  . Pain  . Sleeping Problem  . Stress  . Fatigue  . ADHD    Reason For Service:     Susan Soto is a 54 year old female referred by Daymon Larsen, MD with [facility].  The patient has a history of sleep disturbance with reports of cognitive disturbance and bilateral carpal tunnel syndrome.  The patient has been out of work after taking a medical leave in 2017 but then returning to work in a different job.  The patient has a history of major depressive disorder, adult residual attention deficit disorder and potentially residual cognitive difficulties after being diagnosed and treated for breast cancer in 2009 with both chemotherapy and radiation.  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  Patient reports that she was diagnosed with inattentive type attention deficit disorder in her early 14s and always had significant weakness keeping up with paperwork and documentation with her job.  The patient works as a Education officer, [employer].  The patient reports that she also had significant issues with major depressive disorder.  She reports that she took a medical leave of absence in 2017 after developing significant exacerbation of  her major depression with her mother passing away.  The patient returned to work after couple of months with an opportunity for a different job that had less demands and lower caseload.  Patient reports that she continued to have difficulty keeping up with any kind of paperwork and her previous strategies for dealing with that were not very helpful.  The patient reports that if she is not taking any medications that she just stays in bed, has no energy and significant difficulty with motivation.  The patient reports that this was true even when she was a young child.  The patient reports that she had an exacerbation of her difficulties after her chemotherapy treatment for breast cancer.  The patient reports that initially she experienced "chemo brain" but got better and then in 2017 her symptoms returned and she deteriorated.  The patient reports that typically she would have times of "hyperfocus" to get caught up now she cannot "hyperfocus" and has difficulty keeping up with paperwork.  The patient reports that she made improvements while out on FMLA after her mother and close aunt died.  The patient reports that she has seen a psychiatrist for 20 years in [place].  Patient reports being extremely stressed at work and feeling like she is extremely behind on her caseload and paperwork.  Her job has allowed her to start coming in at 10 AM to help her get better sleep.  Even with this adjustment the patient has difficulty getting into work before 11 AM.  The patient reports that she cant get her thoughts focused  enough to do anything even at home.  She is trying to work long enough to retire early.   The patient is not sleeping well but no significant findings on sleep study beyond delayed REM onset.  The patient had been on extreme low fat diet for many years but has stopped this diet.    The patient now has sudden onset of anxiety and panic attacks.    03/28/20 Update:  The patient returned for visit  and brought the paperwork that she had forgotten in Feb.  The patient described a a decline in memory and executive functioning along with increasing anxiety and worsening of ADD/attention symptoms.  She describes new onset panic attacks within the past 6 months.  "pins and needles and pain in fingers described".  She describes a decline in her ability to perform tasks that involve concentration.  She continues to use Dexedrine for attention along with valium 1-2 times per day.  The patient had been out on FMLA due to major depresion and new onset of anxiety.  FMLA has run out and the patient has had to stop working but her short-term disability has denied her so far, attributing it to a pre-existing condition according to patient.  The patient describes severe insomnia (not OSA) but has had abnormalities on sleep study related to reduction in REM sleep.    04/11/2020 update: The patient return for a visit today and reports that she has been having some improvements in her sleep pattern and there has been some improvements in her fatigue during the day.  She has reduced how much stimulants she is taking as well as taking less Valium.  The patient reports that she continues to have significant difficulties getting going in the morning but she has been going to bed earlier at night and getting up in the morning sooner and on many days she is able to get up at sunrise.  She has been following sleep hygiene efforts.  05/26/2020: The patient reports that she is continued to have some improvements but continues with significant attentional issues and disturbed sleep.  The patient is continued with the lower dose of psychostimulant medications but this has a negative impact on her attention and concentration but does seem to help with her anxiety symptoms.  The patient has continued to work on sleep hygiene issues and has improved somewhat and will be following up with her neurologist soon.  06/20/2020: The patient  reports that she has continued to make improvements particular around her sleep functioning.  The patient reports that she went to the beach to spend some time as well as visit her friend and was away from her husband another household stressors.  She actively worked on sleep hygiene issues and focused on very fundamental behavioral consistencies.  She reports that she did much better as far as her anxiety and stress.  There continues to be a lot of frustration and stressors associated with her husband's activities and behaviors and the patient is working on more focus on taking care of herself rather than trying to fix all intervene with his issues that he himself is not acknowledging or actively trying to address.  Treatment Interventions:  Cognitive Behavioral and coping strategies along with continued history.  Participation Level:   Active  Participation Quality:  Appropriate and Redirectable      Behavioral Observation:  Well Groomed, Alert, and Appropriate.   Current Psychosocial Factors: The patient reports that she continues struggling with basic tasks around the  house but has made some significant improvements overall and has been getting some headway with some of the aspects within her house that she has procrastinated over.  Content of Session:   Reviewed current symptoms and worked on coping and adjustment issues.    Effectiveness of Interventions: Patient is very motivated to improve and working hard on some of the challenging behavioral changes we have done around sleep hygiene.    Target Goals:   Improve sleep, work on anxiety and depression along with cognitive deficits.  Goals Last Reviewed:   05/26/2020  Goals Addressed Today:    Continued with sleep hygiene issues and concerns around cognitive deficits and coping.  Impression/Diagnosis:   Susan Soto is a 54 year old female referred by Daymon Larsen, MD with [facility].  The patient has a history of sleep  disturbance with reports of cognitive disturbance and bilateral carpal tunnel syndrome.  The patient has been out of work after taking a medical leave in 2017 but then returning to work in a different job.  The patient has a history of major depressive disorder, adult residual attention deficit disorder and potentially residual cognitive difficulties after being diagnosed and treated for breast cancer in 2009 with both chemotherapy and radiation.  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  The patient continues with significant anxiety, pain and sleep disturbance.  The patient has been actively working on therapeutic interventions today we worked on some very practical strategies for aiding her free recall of information and strategies for completing tasks.  Diagnosis:   Cognitive and neurobehavioral dysfunction  Sleep disturbance  Chronic fatigue  Panic disorder with agoraphobia  Generalized anxiety disorder  Attention deficit hyperactivity disorder (ADHD), predominantly inattentive type    Ilean Skill, Psy.D. Clinical Psychologist Neuropsychologist

## 2020-08-03 ENCOUNTER — Encounter: Payer: Managed Care, Other (non HMO) | Attending: Psychology | Admitting: Psychology

## 2020-08-03 DIAGNOSIS — F900 Attention-deficit hyperactivity disorder, predominantly inattentive type: Secondary | ICD-10-CM | POA: Insufficient documentation

## 2020-08-03 DIAGNOSIS — F09 Unspecified mental disorder due to known physiological condition: Secondary | ICD-10-CM | POA: Insufficient documentation

## 2020-08-03 DIAGNOSIS — F0789 Other personality and behavioral disorders due to known physiological condition: Secondary | ICD-10-CM | POA: Insufficient documentation

## 2020-08-08 ENCOUNTER — Other Ambulatory Visit: Payer: Self-pay | Admitting: General Surgery

## 2020-08-09 ENCOUNTER — Other Ambulatory Visit: Payer: Self-pay | Admitting: General Surgery

## 2020-08-09 DIAGNOSIS — Z1231 Encounter for screening mammogram for malignant neoplasm of breast: Secondary | ICD-10-CM

## 2020-09-08 ENCOUNTER — Encounter: Payer: Managed Care, Other (non HMO) | Attending: Psychology | Admitting: Psychology

## 2020-09-08 ENCOUNTER — Encounter: Payer: Self-pay | Admitting: Psychology

## 2020-09-08 ENCOUNTER — Other Ambulatory Visit: Payer: Self-pay

## 2020-09-08 DIAGNOSIS — F4001 Agoraphobia with panic disorder: Secondary | ICD-10-CM | POA: Diagnosis not present

## 2020-09-08 DIAGNOSIS — F900 Attention-deficit hyperactivity disorder, predominantly inattentive type: Secondary | ICD-10-CM | POA: Insufficient documentation

## 2020-09-08 DIAGNOSIS — R5382 Chronic fatigue, unspecified: Secondary | ICD-10-CM | POA: Insufficient documentation

## 2020-09-08 DIAGNOSIS — F411 Generalized anxiety disorder: Secondary | ICD-10-CM | POA: Insufficient documentation

## 2020-09-08 DIAGNOSIS — F09 Unspecified mental disorder due to known physiological condition: Secondary | ICD-10-CM | POA: Diagnosis not present

## 2020-09-08 DIAGNOSIS — G479 Sleep disorder, unspecified: Secondary | ICD-10-CM | POA: Insufficient documentation

## 2020-09-08 NOTE — Progress Notes (Signed)
Neuropsychology Visit  Patient:  Susan Soto   DOB: 05-Dec-1965  MR Number: [id number]  Location: [facility], [place] [id number] [place] [address] Dept: [phone]  Date of Service: 09/08/2020  Start: 11 AM End: 12 PM  Duration of Service: 1 Hour  Today's visit was a 1 hour visit that was conducted in my outpatient clinic office with the patient myself present.  Provider/Observer:     Edgardo Roys PsyD  Chief Complaint:      Chief Complaint  Patient presents with  . Anxiety  . Depression  . Stress  . Sleeping Problem  . Fatigue    Reason For Service:     Susan Soto is a 54 year old female referred by Daymon Larsen, MD with [facility].  The patient has a history of sleep disturbance with reports of cognitive disturbance and bilateral carpal tunnel syndrome.  The patient has been out of work after taking a medical leave in 2017 but then returning to work in a different job.  The patient has a history of major depressive disorder, adult residual attention deficit disorder and potentially residual cognitive difficulties after being diagnosed and treated for breast cancer in 2009 with both chemotherapy and radiation.  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  Patient reports that she was diagnosed with inattentive type attention deficit disorder in her early 84s and always had significant weakness keeping up with paperwork and documentation with her job.  The patient works as a Education officer, [employer].  The patient reports that she also had significant issues with major depressive disorder.  She reports that she took a medical leave of absence in 2017 after developing significant exacerbation of her  major depression with her mother passing away.  The patient returned to work after couple of months with an opportunity for a different job that had less demands and lower caseload.  Patient reports that she continued to have difficulty keeping up with any kind of paperwork and her previous strategies for dealing with that were not very helpful.  The patient reports that if she is not taking any medications that she just stays in bed, has no energy and significant difficulty with motivation.  The patient reports that this was true even when she was a young child.  The patient reports that she had an exacerbation of her difficulties after her chemotherapy treatment for breast cancer.  The patient reports that initially she experienced "chemo brain" but got better and then in 2017 her symptoms returned and she deteriorated.  The patient reports that typically she would have times of "hyperfocus" to get caught up now she cannot "hyperfocus" and has difficulty keeping up with paperwork.  The patient reports that she made improvements while out on FMLA after her mother and close aunt died.  The patient reports that she has seen a psychiatrist for 20 years in [place].  Patient reports being extremely stressed at work and feeling like she is extremely behind on her caseload and paperwork.  Her job has allowed her to start coming in at 10 AM to help her get better sleep.  Even with this adjustment the patient has difficulty getting into work before 11 AM.  The patient reports that she cant get her thoughts focused enough to do anything  even at home.  She is trying to work long enough to retire early.   The patient is not sleeping well but no significant findings on sleep study beyond delayed REM onset.  The patient had been on extreme low fat diet for many years but has stopped this diet.    The patient now has sudden onset of anxiety and panic attacks.    03/28/20 Update:  The patient returned for visit and  brought the paperwork that she had forgotten in Feb.  The patient described a a decline in memory and executive functioning along with increasing anxiety and worsening of ADD/attention symptoms.  She describes new onset panic attacks within the past 6 months.  "pins and needles and pain in fingers described".  She describes a decline in her ability to perform tasks that involve concentration.  She continues to use Dexedrine for attention along with valium 1-2 times per day.  The patient had been out on FMLA due to major depresion and new onset of anxiety.  FMLA has run out and the patient has had to stop working but her short-term disability has denied her so far, attributing it to a pre-existing condition according to patient.  The patient describes severe insomnia (not OSA) but has had abnormalities on sleep study related to reduction in REM sleep.    04/11/2020 update: The patient return for a visit today and reports that she has been having some improvements in her sleep pattern and there has been some improvements in her fatigue during the day.  She has reduced how much stimulants she is taking as well as taking less Valium.  The patient reports that she continues to have significant difficulties getting going in the morning but she has been going to bed earlier at night and getting up in the morning sooner and on many days she is able to get up at sunrise.  She has been following sleep hygiene efforts.  05/26/2020: The patient reports that she is continued to have some improvements but continues with significant attentional issues and disturbed sleep.  The patient is continued with the lower dose of psychostimulant medications but this has a negative impact on her attention and concentration but does seem to help with her anxiety symptoms.  The patient has continued to work on sleep hygiene issues and has improved somewhat and will be following up with her neurologist soon.  06/20/2020: The patient reports  that she has continued to make improvements particular around her sleep functioning.  The patient reports that she went to the beach to spend some time as well as visit her friend and was away from her husband another household stressors.  She actively worked on sleep hygiene issues and focused on very fundamental behavioral consistencies.  She reports that she did much better as far as her anxiety and stress.  There continues to be a lot of frustration and stressors associated with her husband's activities and behaviors and the patient is working on more focus on taking care of herself rather than trying to fix all intervene with his issues that he himself is not acknowledging or actively trying to address.  09/08/2020: The patient missed her last appointment as she had documented the time in a confusing way.  It was an 8:00 appointment and she typically has great difficulty getting herself organized to these appointments and miss documented in her calendar and was going to be too late for the appointment before she realized this discrepancy.  The patient  reports that in someway she is done better and has been actively working on a number of things and has made more accomplishments in this period of time then she typically gives herself credit for.  The patient is done very well working on foundational issues and has been improving her behavioral aspects of sleep hygiene, paying close attention all to good nutrition and physical action and activity.  The patient reports that she continues to have difficulty maintaining attention and focus and there have been efforts to adjust some of her medications for optimization.  The patient reports that there continue to be significant stressors with her husband and her husband's psychiatric issues of severe OCD including hoarding type behaviors.  Treatment Interventions:  Cognitive Behavioral and coping strategies along with continued history.  Participation  Level:   Active  Participation Quality:  Appropriate and Redirectable      Behavioral Observation:  Well Groomed, Alert, and Appropriate.   Current Psychosocial Factors: The patient reports that she continues struggling with basic tasks around the house but has made some significant improvements overall and has been getting some headway with some of the aspects within her house that she has procrastinated over.  Content of Session:   Reviewed current symptoms and worked on coping and adjustment issues.    Effectiveness of Interventions: Patient is very motivated to improve and working hard on some of the challenging behavioral changes we have done around sleep hygiene.    Target Goals:   Improve sleep, work on anxiety and depression along with cognitive deficits.  Goals Last Reviewed:   09/08/2020  Goals Addressed Today:    Continued with sleep hygiene issues and concerns around cognitive deficits and coping.  Impression/Diagnosis:   Susan Soto is a 54 year old female referred by Daymon Larsen, MD with [facility].  The patient has a history of sleep disturbance with reports of cognitive disturbance and bilateral carpal tunnel syndrome.  The patient has been out of work after taking a medical leave in 2017 but then returning to work in a different job.  The patient has a history of major depressive disorder, adult residual attention deficit disorder and potentially residual cognitive difficulties after being diagnosed and treated for breast cancer in 2009 with both chemotherapy and radiation.  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  The patient continues with significant anxiety, pain and sleep disturbance.  The patient has been actively working on therapeutic interventions today we worked on some very practical strategies for  aiding her free recall of information and strategies for completing tasks.  Diagnosis:   Cognitive and neurobehavioral dysfunction  Sleep disturbance  Chronic fatigue  Panic disorder with agoraphobia  Generalized anxiety disorder  Attention deficit hyperactivity disorder (ADHD), predominantly inattentive type    Ilean Skill, Psy.D. Clinical Psychologist Neuropsychologist

## 2020-09-21 ENCOUNTER — Ambulatory Visit
Admission: RE | Admit: 2020-09-21 | Discharge: 2020-09-21 | Disposition: A | Discharge status: Home / Self Care | Payer: Managed Care, Other (non HMO) | Source: Ambulatory Visit | Attending: General Surgery | Admitting: General Surgery

## 2020-09-21 ENCOUNTER — Other Ambulatory Visit: Payer: Self-pay

## 2020-09-21 DIAGNOSIS — Z1231 Encounter for screening mammogram for malignant neoplasm of breast: Secondary | ICD-10-CM | POA: Insufficient documentation

## 2020-09-22 ENCOUNTER — Other Ambulatory Visit: Payer: Self-pay | Admitting: Family Medicine

## 2020-09-22 ENCOUNTER — Other Ambulatory Visit (INDEPENDENT_AMBULATORY_CARE_PROVIDER_SITE_OTHER): Payer: Managed Care, Other (non HMO)

## 2020-09-22 DIAGNOSIS — Z13220 Encounter for screening for lipoid disorders: Secondary | ICD-10-CM | POA: Diagnosis not present

## 2020-09-22 DIAGNOSIS — Z1159 Encounter for screening for other viral diseases: Secondary | ICD-10-CM

## 2020-09-22 DIAGNOSIS — Z131 Encounter for screening for diabetes mellitus: Secondary | ICD-10-CM

## 2020-09-22 DIAGNOSIS — E038 Other specified hypothyroidism: Secondary | ICD-10-CM

## 2020-09-22 DIAGNOSIS — E559 Vitamin D deficiency, unspecified: Secondary | ICD-10-CM

## 2020-09-22 NOTE — Addendum Note (Signed)
Addended by: Pilar Grammes on: 09/22/2020 11:22 AM   Modules accepted: Orders

## 2020-09-23 LAB — LIPID PANEL
Chol/HDL Ratio: 2.6 ratio (ref 0.0–4.4)
Cholesterol, Total: 160 mg/dL (ref 100–199)
HDL: 62 mg/dL (ref >39)
LDL Chol Calc (NIH): 89 mg/dL (ref 0–99)
Triglycerides: 38 mg/dL (ref 0–149)
VLDL Cholesterol Cal: 9 mg/dL (ref 5–40)

## 2020-09-23 LAB — BASIC METABOLIC PANEL
BUN/Creatinine Ratio: 21 (ref 9–23)
BUN: 14 mg/dL (ref 6–24)
CO2: 26 mmol/L (ref 20–29)
Calcium: 9.5 mg/dL (ref 8.7–10.2)
Chloride: 101 mmol/L (ref 96–106)
Creatinine, Ser: 0.66 mg/dL (ref 0.57–1.00)
GFR calc Af Amer: 115 mL/min/{1.73_m2} (ref >59)
GFR calc non Af Amer: 100 mL/min/{1.73_m2} (ref >59)
Glucose: 92 mg/dL (ref 65–99)
Potassium: 4.6 mmol/L (ref 3.5–5.2)
Sodium: 139 mmol/L (ref 134–144)

## 2020-09-23 LAB — HEPATITIS C ANTIBODY: Hep C Virus Ab: <0.1 s/co ratio (ref 0.0–0.9)

## 2020-09-23 LAB — T4, FREE: Free T4: 1.17 ng/dL (ref 0.82–1.77)

## 2020-09-23 LAB — TSH: TSH: 3.75 u[IU]/mL (ref 0.450–4.500)

## 2020-09-26 ENCOUNTER — Other Ambulatory Visit: Payer: Self-pay

## 2020-09-26 ENCOUNTER — Encounter: Payer: Self-pay | Admitting: Family Medicine

## 2020-09-26 ENCOUNTER — Other Ambulatory Visit (HOSPITAL_COMMUNITY)
Admission: RE | Admit: 2020-09-26 | Discharge: 2020-09-26 | Disposition: A | Discharge status: Home / Self Care | Payer: Managed Care, Other (non HMO) | Source: Ambulatory Visit | Attending: Family Medicine | Admitting: Family Medicine

## 2020-09-26 ENCOUNTER — Ambulatory Visit (INDEPENDENT_AMBULATORY_CARE_PROVIDER_SITE_OTHER): Payer: Managed Care, Other (non HMO) | Admitting: Family Medicine

## 2020-09-26 VITALS — BP 124/80 | HR 82 | Temp 97.9°F | Ht 65.0 in | Wt 177.2 lb

## 2020-09-26 DIAGNOSIS — Z0000 Encounter for general adult medical examination without abnormal findings: Secondary | ICD-10-CM | POA: Diagnosis not present

## 2020-09-26 DIAGNOSIS — F410 Panic disorder [episodic paroxysmal anxiety] without agoraphobia: Secondary | ICD-10-CM | POA: Diagnosis not present

## 2020-09-26 DIAGNOSIS — E559 Vitamin D deficiency, unspecified: Secondary | ICD-10-CM

## 2020-09-26 DIAGNOSIS — G479 Sleep disorder, unspecified: Secondary | ICD-10-CM

## 2020-09-26 DIAGNOSIS — E038 Other specified hypothyroidism: Secondary | ICD-10-CM | POA: Diagnosis not present

## 2020-09-26 DIAGNOSIS — Z01419 Encounter for gynecological examination (general) (routine) without abnormal findings: Secondary | ICD-10-CM | POA: Insufficient documentation

## 2020-09-26 DIAGNOSIS — R5382 Chronic fatigue, unspecified: Secondary | ICD-10-CM

## 2020-09-26 DIAGNOSIS — E041 Nontoxic single thyroid nodule: Secondary | ICD-10-CM

## 2020-09-26 DIAGNOSIS — F09 Unspecified mental disorder due to known physiological condition: Secondary | ICD-10-CM

## 2020-09-26 DIAGNOSIS — F900 Attention-deficit hyperactivity disorder, predominantly inattentive type: Secondary | ICD-10-CM

## 2020-09-26 DIAGNOSIS — Z853 Personal history of malignant neoplasm of breast: Secondary | ICD-10-CM

## 2020-09-26 MED ORDER — MAGNESIUM 250 MG PO TABS: 1.0000 | ORAL_TABLET | Freq: Every day | ORAL | Status: DC

## 2020-09-26 MED ORDER — MELATONIN 5 MG PO CAPS: 10.0000 mg | ORAL_CAPSULE | Freq: Every evening | ORAL | 0 refills | Status: DC

## 2020-09-26 MED ORDER — VALACYCLOVIR HCL 1 G PO TABS
2000.0000 mg | ORAL_TABLET | Freq: Every day | ORAL | 1 refills | Status: DC | PRN
Start: 2020-09-26 — End: 2021-11-20

## 2020-09-26 NOTE — Progress Notes (Addendum)
Patient ID: Susan Soto, female    DOB: June 19, 1966, 54 y.o.   MRN: [id number]  This visit was conducted in person.  BP 124/80 (BP Location: Right Arm, Patient Position: Sitting, Cuff Size: Normal)   Pulse 82   Temp 97.9 F (36.6 C) (Temporal)   Ht 5\' 5"  (1.651 m)   Wt 177 lb 3 oz (80.4 kg)   LMP 03/04/2014   SpO2 99%   BMI 29.49 kg/m    CC: CPE Subjective:   HPI: Susan Soto is a 54 y.o. female presenting on 09/26/2020 for Annual Exam   Decided to go early into retirement.  Seeing Dr Sima Matas neuropsychologist - found sleep disturbance (?circadian rhythm disorder), chronic fatigue, panic disorder, GAD and ADHD.  Released from sleep doctor (Dohmeier).   Psych requests vit D and CBC, B12 and iron panel - as well as zinc and magnesium - for anxiety and sleep. Requests labs through [facility].   Preventative: COLONOSCOPY WITH PROPOFOL 10/06/2019 - WNL, rpt 10 yrs [facility]) Well woman - mother dx with cervical cancer 34s. Pap smear normal 08/2018. rpt today. Benign endometrial biopsy for post menopausal bleeding ~2018. Previously getting pap every year given family hx. Discussed guidelines, agrees to space out to Q2 yrs.  Mammogram 09/2020 Birads1. H/o L breast cancer s/p radiation and chemotherapy 2009 - yearly mammograms, sees surgery yearly.  LMP - 02/2014. Postmenopausal.  Flu yearly - declines this year Td 2003, Tdap 2013  COVID vaccine - discussed, declines  Shingrix - 09/2019, 11/2019  Seat belt use discussed.  Sunscreen use discussed.  No changing moles.  Non smoker. Significant 2nd hand smoke exposure.  Alcohol - none.  Dentist q6 mo  Eye exam - yearly  Caffeine: 1 cup coffee in am  Lives with husband and 1 son, daughter in college, 1 dog and 1 cat, 5-6 outside cats  Occupation: Camera operator job  Activity: walking dog - wants to increase walking to 1hr/day  Diet: joined [employer], good water, fruits/vegetables daily, red meat 3x/wk, fish 2x/wk, more  stevia and less regular sugar. Switched from iodinated salt to sea salt.      Relevant past medical, surgical, family and social history reviewed and updated as indicated. Interim medical history since our last visit reviewed. Allergies and medications reviewed and updated. Outpatient Medications Prior to Visit  Medication Sig Dispense Refill   Ascorbic Acid (VITAMIN C) POWD Take 1,000 mg by mouth daily.     ASHWAGANDHA PO Take 375 mg by mouth at bedtime.     B Complex Vitamins (VITAMIN B COMPLEX PO) Take by mouth daily.     COLLAGEN PO Take 1 tablet by mouth daily.     diazepam (VALIUM) 5 MG tablet Take 5 mg by mouth at bedtime.     lamoTRIgine 50 MG TBDP Take 50 mg by mouth daily. Takes 2 tablets once daily     medium chain triglycerides (MCT OIL) oil Take by mouth 3 (three) times daily. Takes 3 TBSP daily     Multiple Vitamins-Minerals (ANTIOXIDANT FORMULA SG) capsule Take 1 capsule by mouth daily. Powder daily     NON FORMULARY Vitamins ADEK     Omega-3 Fatty Acids (OMEGA 3 PO) Take 690 mg by mouth daily. Nordic naturals     OVER THE COUNTER MEDICATION 2 capsules daily. KETO- metabolic ketosis support     Probiotic Product (PROBIOTIC DAILY PO) Take by mouth daily.     TURMERIC PO Take 1 tablet by  mouth daily.     Cholecalciferol (VITAMIN D-3) 5000 UNITS TABS Take 2 tablets by mouth daily.      dextroamphetamine (DEXEDRINE SPANSULE) 15 MG 24 hr capsule Take 15 mg by mouth daily. Takes in the morning     dextroamphetamine (DEXTROSTAT) 10 MG tablet Take 10 mg by mouth daily. TAKE 1 TABLETS (10 MG) ONCE DAILY AT LUNCHTIME. Can take 1-2 tablets in the afternoon if needed.  0   levothyroxine (SYNTHROID) 50 MCG tablet TAKE 1 TABLET BY MOUTH EVERY DAY 30 tablet 0   valACYclovir (VALTREX) 1000 MG tablet Take 2 tablets (2,000 mg total) by mouth 2 (two) times daily. x1 day (Patient taking differently: Take 2,000 mg by mouth daily as needed. As needed for fever blisters) 20 tablet 1   diazepam  (VALIUM) 2 MG tablet Take 2 mg by mouth every 12 (twelve) hours as needed for anxiety (bladder retention). 5 mg tab     Docusate Calcium (STOOL SOFTENER PO) Take by mouth as needed.     No facility-administered medications prior to visit.     Per HPI unless specifically indicated in ROS section below Review of Systems  Constitutional:  Negative for activity change, appetite change, chills, fatigue, fever and unexpected weight change.  HENT:  Negative for hearing loss.   Eyes:  Negative for visual disturbance.  Respiratory:  Negative for cough, chest tightness, shortness of breath and wheezing.   Cardiovascular:  Negative for chest pain, palpitations and leg swelling.  Gastrointestinal:  Negative for abdominal distention, abdominal pain, blood in stool, constipation, diarrhea, nausea and vomiting.  Genitourinary:  Negative for difficulty urinating and hematuria.  Musculoskeletal:  Negative for arthralgias, myalgias and neck pain.  Skin:  Negative for rash.  Neurological:  Negative for dizziness, seizures, syncope and headaches.  Hematological:  Negative for adenopathy. Does not bruise/bleed easily.  Psychiatric/Behavioral:  Positive for dysphoric mood. The patient is nervous/anxious.   Objective:  BP 124/80 (BP Location: Right Arm, Patient Position: Sitting, Cuff Size: Normal)   Pulse 82   Temp 97.9 F (36.6 C) (Temporal)   Ht 5\' 5"  (1.651 m)   Wt 177 lb 3 oz (80.4 kg)   LMP 03/04/2014   SpO2 99%   BMI 29.49 kg/m   Wt Readings from Last 3 Encounters:  12/14/21 174 lb 8 oz (79.2 kg)  11/20/21 177 lb 1 oz (80.3 kg)  09/26/20 177 lb 3 oz (80.4 kg)      Physical Exam Vitals and nursing note reviewed. Exam conducted with a chaperone present.  Constitutional:      General: She is not in acute distress.    Appearance: Normal appearance. She is well-developed. She is not ill-appearing.  HENT:     Head: Normocephalic and atraumatic.     Right Ear: Hearing, tympanic membrane, ear  canal and external ear normal.     Left Ear: Hearing, tympanic membrane, ear canal and external ear normal.  Eyes:     General: No scleral icterus.    Extraocular Movements: Extraocular movements intact.     Conjunctiva/sclera: Conjunctivae normal.     Pupils: Pupils are equal, round, and reactive to light.  Neck:     Thyroid: Thyroid mass (L sided nodule) present. No thyromegaly.  Cardiovascular:     Rate and Rhythm: Normal rate and regular rhythm.     Pulses: Normal pulses.          Radial pulses are 2+ on the right side and 2+ on the left side.  Heart sounds: Normal heart sounds. No murmur heard. Pulmonary:     Effort: Pulmonary effort is normal. No respiratory distress.     Breath sounds: Normal breath sounds. No wheezing, rhonchi or rales.  Abdominal:     General: Abdomen is flat. Bowel sounds are normal. There is no distension.     Palpations: Abdomen is soft. There is no mass.     Tenderness: There is no abdominal tenderness. There is no guarding or rebound.     Hernia: No hernia is present.  Genitourinary:    Exam position: Supine.     Pubic Area: No rash.      Labia:        Right: No rash.        Left: No rash.      Cervix: Normal.     Uterus: Normal.      Adnexa: Right adnexa normal.       Left: Mass present.      Comments:  Vaginal atrophy present Pap performed on cervix Musculoskeletal:        General: Normal range of motion.     Cervical back: Normal range of motion and neck supple.     Right lower leg: No edema.     Left lower leg: No edema.  Lymphadenopathy:     Cervical: No cervical adenopathy.  Skin:    General: Skin is warm and dry.     Findings: No rash.  Neurological:     General: No focal deficit present.     Mental Status: She is alert and oriented to person, place, and time.     Comments: CN grossly intact, station and gait intact  Psychiatric:        Mood and Affect: Mood normal.        Behavior: Behavior normal.        Thought Content:  Thought content normal.        Judgment: Judgment normal.      Results for orders placed or performed in visit on 09/26/20  Vitamin B12  Result Value Ref Range   Vitamin B-12 564 232 - 1,245 pg/mL  VITAMIN D 25 Hydroxy (Vit-D Deficiency, Fractures)  Result Value Ref Range   Vit D, 25-Hydroxy 34.0 30.0 - 100.0 ng/mL  CBC with Differential/Platelet  Result Value Ref Range   WBC 7.3 3.4 - 10.8 x10E3/uL   RBC 4.63 3.77 - 5.28 x10E6/uL   Hemoglobin 13.8 11.1 - 15.9 g/dL   Hematocrit 41.4 34.0 - 46.6 %   MCV 89 79 - 97 fL   MCH 29.8 26.6 - 33.0 pg   MCHC 33.3 31.5 - 35.7 g/dL   RDW 12.8 11.7 - 15.4 %   Platelets 281 150 - 450 x10E3/uL   Neutrophils 52 Not Estab. %   Lymphs 36 Not Estab. %   Monocytes 10 Not Estab. %   Eos 1 Not Estab. %   Basos 1 Not Estab. %   Neutrophils Absolute 3.8 1.4 - 7.0 x10E3/uL   Lymphocytes Absolute 2.6 0.7 - 3.1 x10E3/uL   Monocytes Absolute 0.7 0.1 - 0.9 x10E3/uL   EOS (ABSOLUTE) 0.1 0.0 - 0.4 x10E3/uL   Basophils Absolute 0.0 0.0 - 0.2 x10E3/uL   Immature Granulocytes 0 Not Estab. %   Immature Grans (Abs) 0.0 0.0 - 0.1 x10E3/uL  Ferritin  Result Value Ref Range   Ferritin 165 (H) 15 - 150 ng/mL  Zinc  Result Value Ref Range   Zinc 72 44 - 115 ug/dL  Magnesium  Result Value Ref Range   Magnesium 1.9 1.6 - 2.3 mg/dL  Iron  Result Value Ref Range   Iron 80 27 - 159 ug/dL  Transferrin  Result Value Ref Range   Transferrin 310 192 - 364 mg/dL  Specimen status report  Result Value Ref Range   specimen status report Comment   Cytology - PAP  Result Value Ref Range   High risk HPV Negative    Adequacy      Satisfactory for evaluation. The presence or absence of an   Adequacy      endocervical/transformation zone component cannot be determined because   Adequacy of atrophy.    Diagnosis      - Negative for intraepithelial lesion or malignancy (NILM)   Comment Inflammation and atrophic changes are present.    Comment Normal Reference Range  HPV - Negative    Depression screen [facility] 2/9 12/14/2021 09/26/2020 09/22/2019 09/16/2018 07/09/2017  Decreased Interest 1 1 0 0 0  Down, Depressed, Hopeless 1 2 1  0 0  PHQ - 2 Score 2 3 1  0 0  Altered sleeping 1 3 3  - -  Tired, decreased energy 3 3 3  - -  Change in appetite 1 0 2 - -  Feeling bad or failure about yourself  2 2 2  - -  Trouble concentrating 2 3 3  - -  Moving slowly or fidgety/restless 1 1 1  - -  Suicidal thoughts 0 0 0 - -  PHQ-9 Score 12 15 15  - -  Some recent data might be hidden    GAD 7 : Generalized Anxiety Score 12/14/2021 09/26/2020 09/22/2019  Nervous, Anxious, on Edge 3 3 3   Control/stop worrying 1 2 2   Worry too much - different things 2 1 2   Trouble relaxing 1 3 2   Restless 1 2 2   Easily annoyed or irritable 3 3 3   Afraid - awful might happen 1 0 1  Total GAD 7 Score 12 14 15    Assessment & Plan:  This visit occurred during the SARS-CoV-2 public health emergency.  Safety protocols were in place, including screening questions prior to the visit, additional usage of staff PPE, and extensive cleaning of exam room while observing appropriate contact time as indicated for disinfecting solutions.   Problem List Items Addressed This Visit     Healthcare maintenance - Primary (Chronic)    Preventative protocols reviewed and updated unless pt declined. Discussed healthy diet and lifestyle.       Subclinical hypothyroidism    She dropped levothyroxine to 25mcg daily. Levels stable, low normal. Will f/u with psych for dose titration.       MDD (major depressive disorder), recurrent episode, moderate ([place])    Continue psych f/u.       Relevant Medications   diazepam (VALIUM) 5 MG tablet   Attention deficit hyperactivity disorder (ADHD), predominantly inattentive type    Followed by psych.  She is currently off stimulants - wants to manage with healthy diet and supplements.       Personal history of breast cancer    Regularly sees gen surgery.      Vitamin  D deficiency    Update levels. Continues 10000 IU daily.       Relevant Orders   VITAMIN D 25 Hydroxy (Vit-D Deficiency, Fractures) (Completed)   Chronic fatigue    Seeing neuropsychology and psychiatry.       Relevant Orders   Vitamin B12 (Completed)   CBC with Differential/Platelet (  Completed)   Ferritin (Completed)   Zinc (Completed)   Magnesium (Completed)   Thyroid nodule    This is followed by gen surgery - has appt later this week.       Sleep disturbance    Manages with daily exposure outdoors during sunrise and sunset to help regulate circadian rhythm.       Relevant Orders   Zinc (Completed)   Magnesium (Completed)   Cognitive and neurobehavioral dysfunction   Relevant Orders   Zinc (Completed)   Magnesium (Completed)   Other Visit Diagnoses     Encounter for annual routine gynecological examination       Relevant Orders   Cytology - PAP (Completed)        Meds ordered this encounter  Medications   DISCONTD: valACYclovir (VALTREX) 1000 MG tablet    Sig: Take 2 tablets (2,000 mg total) by mouth daily as needed. As needed for fever blisters    Dispense:  20 tablet    Refill:  1   Magnesium 250 MG TABS    Sig: Take 1 tablet (250 mg total) by mouth daily.   Melatonin 5 MG CAPS    Sig: Take 2 capsules (10 mg total) by mouth at bedtime.    Refill:  0   Orders Placed This Encounter  Procedures   Vitamin B12   VITAMIN D 25 Hydroxy (Vit-D Deficiency, Fractures)   CBC with Differential/Platelet   Ferritin   Zinc   Magnesium   Iron   Transferrin   Specimen status report    Patient instructions: Pap smear today Labs today  Good to see you today Return as needed or in 1 year for next physical.   Follow up plan: Return in about 1 year (around 09/26/2021), or if symptoms worsen or fail to improve, for annual exam, prior fasting for blood work.  Ria Bush, MD

## 2020-09-26 NOTE — Patient Instructions (Addendum)
Pap smear today Labs today  Good to see you today Return as needed or in 1 year for next physical.   Health Maintenance for Postmenopausal Women Menopause is a normal process in which your ability to get pregnant comes to an end. This process happens slowly over many months or years, usually between the ages of 58 and 23. Menopause is complete when you have missed your menstrual periods for 12 months. It is important to talk with your health care provider about some of the most common conditions that affect women after menopause (postmenopausal women). These include heart disease, cancer, and bone loss (osteoporosis). Adopting a healthy lifestyle and getting preventive care can help to promote your health and wellness. The actions you take can also lower your chances of developing some of these common conditions. What should I know about menopause? During menopause, you may get a number of symptoms, such as:  Hot flashes. These can be moderate or severe.  Night sweats.  Decrease in sex drive.  Mood swings.  Headaches.  Tiredness.  Irritability.  Memory problems.  Insomnia. Choosing to treat or not to treat these symptoms is a decision that you make with your health care provider. Do I need hormone replacement therapy?  Hormone replacement therapy is effective in treating symptoms that are caused by menopause, such as hot flashes and night sweats.  Hormone replacement carries certain risks, especially as you become older. If you are thinking about using estrogen or estrogen with progestin, discuss the benefits and risks with your health care provider. What is my risk for heart disease and stroke? The risk of heart disease, heart attack, and stroke increases as you age. One of the causes may be a change in the body's hormones during menopause. This can affect how your body uses dietary fats, triglycerides, and cholesterol. Heart attack and stroke are medical emergencies. There are  many things that you can do to help prevent heart disease and stroke. Watch your blood pressure  High blood pressure causes heart disease and increases the risk of stroke. This is more likely to develop in people who have high blood pressure readings, are of African descent, or are overweight.  Have your blood pressure checked: ? Every 3-5 years if you are 33-52 years of age. ? Every year if you are 44 years old or older. Eat a healthy diet   Eat a diet that includes plenty of vegetables, fruits, low-fat dairy products, and lean protein.  Do not eat a lot of foods that are high in solid fats, added sugars, or sodium. Get regular exercise Get regular exercise. This is one of the most important things you can do for your health. Most adults should:  Try to exercise for at least 150 minutes each week. The exercise should increase your heart rate and make you sweat (moderate-intensity exercise).  Try to do strengthening exercises at least twice each week. Do these in addition to the moderate-intensity exercise.  Spend less time sitting. Even light physical activity can be beneficial. Other tips  Work with your health care provider to achieve or maintain a healthy weight.  Do not use any products that contain nicotine or tobacco, such as cigarettes, e-cigarettes, and chewing tobacco. If you need help quitting, ask your health care provider.  Know your numbers. Ask your health care provider to check your cholesterol and your blood sugar (glucose). Continue to have your blood tested as directed by your health care provider. Do I need screening for  cancer? Depending on your health history and family history, you may need to have cancer screening at different stages of your life. This may include screening for:  Breast cancer.  Cervical cancer.  Lung cancer.  Colorectal cancer. What is my risk for osteoporosis? After menopause, you may be at increased risk for osteoporosis.  Osteoporosis is a condition in which bone destruction happens more quickly than new bone creation. To help prevent osteoporosis or the bone fractures that can happen because of osteoporosis, you may take the following actions:  If you are 19-24 years old, get at least 1,000 mg of calcium and at least 600 mg of vitamin D per day.  If you are older than age 45 but younger than age 86, get at least 1,200 mg of calcium and at least 600 mg of vitamin D per day.  If you are older than age 2, get at least 1,200 mg of calcium and at least 800 mg of vitamin D per day. Smoking and drinking excessive alcohol increase the risk of osteoporosis. Eat foods that are rich in calcium and vitamin D, and do weight-bearing exercises several times each week as directed by your health care provider. How does menopause affect my mental health? Depression may occur at any age, but it is more common as you become older. Common symptoms of depression include:  Low or sad mood.  Changes in sleep patterns.  Changes in appetite or eating patterns.  Feeling an overall lack of motivation or enjoyment of activities that you previously enjoyed.  Frequent crying spells. Talk with your health care provider if you think that you are experiencing depression. General instructions See your health care provider for regular wellness exams and vaccines. This may include:  Scheduling regular health, dental, and eye exams.  Getting and maintaining your vaccines. These include: ? Influenza vaccine. Get this vaccine each year before the flu season begins. ? Pneumonia vaccine. ? Shingles vaccine. ? Tetanus, diphtheria, and pertussis (Tdap) booster vaccine. Your health care provider may also recommend other immunizations. Tell your health care provider if you have ever been abused or do not feel safe at home. Summary  Menopause is a normal process in which your ability to get pregnant comes to an end.  This condition causes  hot flashes, night sweats, decreased interest in sex, mood swings, headaches, or lack of sleep.  Treatment for this condition may include hormone replacement therapy.  Take actions to keep yourself healthy, including exercising regularly, eating a healthy diet, watching your weight, and checking your blood pressure and blood sugar levels.  Get screened for cancer and depression. Make sure that you are up to date with all your vaccines. This information is not intended to replace advice given to you by your health care provider. Make sure you discuss any questions you have with your health care provider. Document Revised: 10/01/2018 Document Reviewed: 10/01/2018 Elsevier Patient Education  2020 [employer].

## 2020-09-27 NOTE — Assessment & Plan Note (Signed)
This is followed by gen surgery - has appt later this week.

## 2020-09-27 NOTE — Assessment & Plan Note (Addendum)
Seeing neuropsychology and psychiatry.

## 2020-09-27 NOTE — Assessment & Plan Note (Signed)
Regularly sees gen surgery.

## 2020-09-27 NOTE — Assessment & Plan Note (Signed)
Preventative protocols reviewed and updated unless pt declined. Discussed healthy diet and lifestyle.  

## 2020-09-27 NOTE — Assessment & Plan Note (Signed)
Manages with daily exposure outdoors during sunrise and sunset to help regulate circadian rhythm.

## 2020-09-27 NOTE — Assessment & Plan Note (Addendum)
Followed by psych.  She is currently off stimulants - wants to manage with healthy diet and supplements.

## 2020-09-27 NOTE — Assessment & Plan Note (Signed)
Update levels. Continues 10000 IU daily.

## 2020-09-27 NOTE — Assessment & Plan Note (Signed)
Continue psych f/u 

## 2020-09-27 NOTE — Assessment & Plan Note (Signed)
She dropped levothyroxine to 29mcg daily. Levels stable, low normal. Will f/u with psych for dose titration.

## 2020-09-28 LAB — CBC WITH DIFFERENTIAL/PLATELET
Basophils Absolute: 0 10*3/uL (ref 0.0–0.2)
Basos: 1 %
EOS (ABSOLUTE): 0.1 10*3/uL (ref 0.0–0.4)
Eos: 1 %
Hematocrit: 41.4 % (ref 34.0–46.6)
Hemoglobin: 13.8 g/dL (ref 11.1–15.9)
Immature Grans (Abs): 0 10*3/uL (ref 0.0–0.1)
Immature Granulocytes: 0 %
Lymphocytes Absolute: 2.6 10*3/uL (ref 0.7–3.1)
Lymphs: 36 %
MCH: 29.8 pg (ref 26.6–33.0)
MCHC: 33.3 g/dL (ref 31.5–35.7)
MCV: 89 fL (ref 79–97)
Monocytes Absolute: 0.7 10*3/uL (ref 0.1–0.9)
Monocytes: 10 %
Neutrophils Absolute: 3.8 10*3/uL (ref 1.4–7.0)
Neutrophils: 52 %
Platelets: 281 10*3/uL (ref 150–450)
RBC: 4.63 x10E6/uL (ref 3.77–5.28)
RDW: 12.8 % (ref 11.7–15.4)
WBC: 7.3 10*3/uL (ref 3.4–10.8)

## 2020-09-28 LAB — CYTOLOGY - PAP
Comment: NEGATIVE
Diagnosis: NEGATIVE
High risk HPV: NEGATIVE

## 2020-09-28 LAB — VITAMIN B12: Vitamin B-12: 564 pg/mL (ref 232–1245)

## 2020-09-28 LAB — ZINC: Zinc: 72 ug/dL (ref 44–115)

## 2020-09-28 LAB — VITAMIN D 25 HYDROXY (VIT D DEFICIENCY, FRACTURES): Vit D, 25-Hydroxy: 34 ng/mL (ref 30.0–100.0)

## 2020-09-28 LAB — FERRITIN: Ferritin: 165 ng/mL — ABNORMAL HIGH (ref 15–150)

## 2020-09-28 LAB — MAGNESIUM: Magnesium: 1.9 mg/dL (ref 1.6–2.3)

## 2020-09-29 ENCOUNTER — Other Ambulatory Visit: Payer: Self-pay | Admitting: General Surgery

## 2020-09-29 DIAGNOSIS — Z1231 Encounter for screening mammogram for malignant neoplasm of breast: Secondary | ICD-10-CM

## 2020-09-29 DIAGNOSIS — E041 Nontoxic single thyroid nodule: Secondary | ICD-10-CM

## 2020-09-29 LAB — SPECIMEN STATUS REPORT

## 2020-09-29 LAB — TRANSFERRIN: Transferrin: 310 mg/dL (ref 192–364)

## 2020-09-29 LAB — IRON: Iron: 80 ug/dL (ref 27–159)

## 2020-10-04 ENCOUNTER — Ambulatory Visit
Admission: RE | Admit: 2020-10-04 | Discharge: 2020-10-04 | Disposition: A | Discharge status: Home / Self Care | Payer: Managed Care, Other (non HMO) | Source: Ambulatory Visit | Attending: General Surgery | Admitting: General Surgery

## 2020-10-04 ENCOUNTER — Other Ambulatory Visit: Payer: Self-pay

## 2020-10-04 DIAGNOSIS — E041 Nontoxic single thyroid nodule: Secondary | ICD-10-CM | POA: Diagnosis present

## 2020-10-06 ENCOUNTER — Other Ambulatory Visit: Payer: Self-pay

## 2020-10-06 ENCOUNTER — Encounter: Payer: Managed Care, Other (non HMO) | Attending: Psychology | Admitting: Psychology

## 2020-10-06 DIAGNOSIS — R5382 Chronic fatigue, unspecified: Secondary | ICD-10-CM

## 2020-10-06 DIAGNOSIS — F09 Unspecified mental disorder due to known physiological condition: Secondary | ICD-10-CM | POA: Diagnosis present

## 2020-10-06 DIAGNOSIS — G479 Sleep disorder, unspecified: Secondary | ICD-10-CM

## 2020-10-06 DIAGNOSIS — G5603 Carpal tunnel syndrome, bilateral upper limbs: Secondary | ICD-10-CM | POA: Diagnosis present

## 2020-10-06 DIAGNOSIS — F4001 Agoraphobia with panic disorder: Secondary | ICD-10-CM

## 2020-10-07 ENCOUNTER — Encounter: Payer: Self-pay | Admitting: Psychology

## 2020-10-07 NOTE — Progress Notes (Signed)
Neuropsychology Visit  Patient:  Susan Soto   DOB: 1966/02/11  MR Number: [id number]  Location: [facility] [place], [place] [id number] [place] [id number] Dept: [phone]  Date of Service: 10/06/2020  Start: 11 AM End: 12 PM  Duration of Service: 1 Hour  Today's visit was a 1 hour visit that was conducted in my outpatient clinic office with the patient myself present.  Provider/Observer:     Edgardo Roys PsyD  Chief Complaint:      Chief Complaint  Patient presents with  . Anxiety  . Depression  . Sleeping Problem  . Stress  . Fatigue  . Pain    Reason For Service:     Susan Soto is a 54 year old female referred by Daymon Larsen, MD with [facility].  The patient has a history of sleep disturbance with reports of cognitive disturbance and bilateral carpal tunnel syndrome.  The patient has been out of work after taking a medical leave in 2017 but then returning to work in a different job.  The patient has a history of major depressive disorder, adult residual attention deficit disorder and potentially residual cognitive difficulties after being diagnosed and treated for breast cancer in 2009 with both chemotherapy and radiation.  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  Patient reports that she was diagnosed with inattentive type attention deficit disorder in her early 68s and always had significant weakness keeping up with paperwork and documentation with her job.  The patient works as a Education officer, [employer].  The patient reports that she also had significant issues with major depressive disorder.  She reports that she took a medical leave of absence in 2017 after developing significant exacerbation  of her major depression with her mother passing away.  The patient returned to work after couple of months with an opportunity for a different job that had less demands and lower caseload.  Patient reports that she continued to have difficulty keeping up with any kind of paperwork and her previous strategies for dealing with that were not very helpful.  The patient reports that if she is not taking any medications that she just stays in bed, has no energy and significant difficulty with motivation.  The patient reports that this was true even when she was a young child.  The patient reports that she had an exacerbation of her difficulties after her chemotherapy treatment for breast cancer.  The patient reports that initially she experienced "chemo brain" but got better and then in 2017 her symptoms returned and she deteriorated.  The patient reports that typically she would have times of "hyperfocus" to get caught up now she cannot "hyperfocus" and has difficulty keeping up with paperwork.  The patient reports that she made improvements while out on FMLA after her mother and close aunt died.  The patient reports that she has seen a psychiatrist for 20 years in [place].  Patient reports being extremely stressed at work and feeling like she is extremely behind on her caseload and paperwork.  Her job has allowed her to start coming in at 10 AM to help her get better sleep.  Even with this adjustment the patient has difficulty getting into work before 11 AM.  The patient reports that she cant get her thoughts focused enough  to do anything even at home.  She is trying to work long enough to retire early.   The patient is not sleeping well but no significant findings on sleep study beyond delayed REM onset.  The patient had been on extreme low fat diet for many years but has stopped this diet.    The patient now has sudden onset of anxiety and panic attacks.    03/28/20 Update:  The patient returned for  visit and brought the paperwork that she had forgotten in Feb.  The patient described a a decline in memory and executive functioning along with increasing anxiety and worsening of ADD/attention symptoms.  She describes new onset panic attacks within the past 6 months.  "pins and needles and pain in fingers described".  She describes a decline in her ability to perform tasks that involve concentration.  She continues to use Dexedrine for attention along with valium 1-2 times per day.  The patient had been out on FMLA due to major depresion and new onset of anxiety.  FMLA has run out and the patient has had to stop working but her short-term disability has denied her so far, attributing it to a pre-existing condition according to patient.  The patient describes severe insomnia (not OSA) but has had abnormalities on sleep study related to reduction in REM sleep.    04/11/2020 update: The patient return for a visit today and reports that she has been having some improvements in her sleep pattern and there has been some improvements in her fatigue during the day.  She has reduced how much stimulants she is taking as well as taking less Valium.  The patient reports that she continues to have significant difficulties getting going in the morning but she has been going to bed earlier at night and getting up in the morning sooner and on many days she is able to get up at sunrise.  She has been following sleep hygiene efforts.  05/26/2020: The patient reports that she is continued to have some improvements but continues with significant attentional issues and disturbed sleep.  The patient is continued with the lower dose of psychostimulant medications but this has a negative impact on her attention and concentration but does seem to help with her anxiety symptoms.  The patient has continued to work on sleep hygiene issues and has improved somewhat and will be following up with her neurologist soon.  06/20/2020: The patient  reports that she has continued to make improvements particular around her sleep functioning.  The patient reports that she went to the beach to spend some time as well as visit her friend and was away from her husband another household stressors.  She actively worked on sleep hygiene issues and focused on very fundamental behavioral consistencies.  She reports that she did much better as far as her anxiety and stress.  There continues to be a lot of frustration and stressors associated with her husband's activities and behaviors and the patient is working on more focus on taking care of herself rather than trying to fix all intervene with his issues that he himself is not acknowledging or actively trying to address.  09/08/2020: The patient missed her last appointment as she had documented the time in a confusing way.  It was an 8:00 appointment and she typically has great difficulty getting herself organized to these appointments and miss documented in her calendar and was going to be too late for the appointment before she realized this discrepancy.  The patient reports that in someway she is done better and has been actively working on a number of things and has made more accomplishments in this period of time then she typically gives herself credit for.  The patient is done very well working on foundational issues and has been improving her behavioral aspects of sleep hygiene, paying close attention all to good nutrition and physical action and activity.  The patient reports that she continues to have difficulty maintaining attention and focus and there have been efforts to adjust some of her medications for optimization.  The patient reports that there continue to be significant stressors with her husband and her husband's psychiatric issues of severe OCD including hoarding type behaviors.  10/06/2020: The patient reports that she has continued to have significant issues of cognitive difficulties and  fatigue during the day.  Patient continues to have sleep disturbance and we talked about some very practical issues around sleep hygiene including sleeping in her room without pets that she reports will wake her up at various times at night.  Patient continues to have significant pain continued severe sleep disturbance but has improved sleep hygiene and sleep pattern significantly since initially being here.  Treatment Interventions:  Cognitive Behavioral and coping strategies along with continued history.  Participation Level:   Active  Participation Quality:  Appropriate and Redirectable      Behavioral Observation:  Well Groomed, Alert, and Appropriate.   Current Psychosocial Factors: The patient reports that she continues struggling with basic tasks around the house but has made some significant improvements overall and has been getting some headway with some of the aspects within her house that she has procrastinated over.  Content of Session:   Reviewed current symptoms and worked on coping and adjustment issues.    Effectiveness of Interventions: Patient is very motivated to improve and working hard on some of the challenging behavioral changes we have done around sleep hygiene.    Target Goals:   Improve sleep, work on anxiety and depression along with cognitive deficits.  Goals Last Reviewed:   10/06/2020  Goals Addressed Today:    Continued with sleep hygiene issues and concerns around cognitive deficits and coping.  Impression/Diagnosis:   Aireanna Luellen is a 54 year old female referred by Daymon Larsen, MD with [facility].  The patient has a history of sleep disturbance with reports of cognitive disturbance and bilateral carpal tunnel syndrome.  The patient has been out of work after taking a medical leave in 2017 but then returning to work in a different job.  The patient has a history of major depressive disorder, adult residual attention deficit disorder and  potentially residual cognitive difficulties after being diagnosed and treated for breast cancer in 2009 with both chemotherapy and radiation.  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  The patient continues with significant anxiety, pain and sleep disturbance.  The patient has been actively working on therapeutic interventions today we worked on some very practical strategies for aiding her free recall of information and strategies for completing tasks.  10/06/2020: The patient reports that she has continued to have significant issues of cognitive difficulties and fatigue during the day.  Patient continues to have sleep disturbance and we talked about some very practical issues around sleep hygiene including sleeping in her room without pets that she reports will wake her up at various  times at night.  Patient continues to have significant pain continued severe sleep disturbance but has improved sleep hygiene and sleep pattern significantly since initially being here.  Diagnosis:   Cognitive and neurobehavioral dysfunction  Chronic fatigue  Sleep disturbance  Panic disorder with agoraphobia  Bilateral carpal tunnel syndrome    Ilean Skill, Psy.D. Clinical Psychologist Neuropsychologist

## 2020-10-25 ENCOUNTER — Other Ambulatory Visit: Payer: Self-pay | Admitting: Internal Medicine

## 2020-11-03 ENCOUNTER — Ambulatory Visit: Payer: Managed Care, Other (non HMO) | Admitting: Psychology

## 2020-11-10 ENCOUNTER — Other Ambulatory Visit: Payer: Self-pay

## 2020-11-10 ENCOUNTER — Encounter: Payer: Managed Care, Other (non HMO) | Attending: Psychology | Admitting: Psychology

## 2020-11-10 DIAGNOSIS — R5382 Chronic fatigue, unspecified: Secondary | ICD-10-CM | POA: Insufficient documentation

## 2020-11-10 DIAGNOSIS — G479 Sleep disorder, unspecified: Secondary | ICD-10-CM | POA: Diagnosis present

## 2020-11-10 DIAGNOSIS — F900 Attention-deficit hyperactivity disorder, predominantly inattentive type: Secondary | ICD-10-CM | POA: Diagnosis present

## 2020-11-10 DIAGNOSIS — F09 Unspecified mental disorder due to known physiological condition: Secondary | ICD-10-CM | POA: Diagnosis present

## 2020-11-10 DIAGNOSIS — F411 Generalized anxiety disorder: Secondary | ICD-10-CM | POA: Insufficient documentation

## 2020-11-10 DIAGNOSIS — F4001 Agoraphobia with panic disorder: Secondary | ICD-10-CM | POA: Insufficient documentation

## 2020-11-11 ENCOUNTER — Encounter: Payer: Self-pay | Admitting: Psychology

## 2020-11-11 NOTE — Progress Notes (Signed)
Neuropsychology Visit  Patient:  Susan Soto   DOB: [birth year]  MR Number: [id number]  Location: [facility] [place], [place] [id number] [place] [id number] Dept: [phone]  Date of Service: 11/10/2020  Start: 2 PM End: 3 PM  Duration of Service: 1 Hour  Today's visit was a 1 hour visit that was conducted in my outpatient clinic office with the patient myself present.  Provider/Observer:     Edgardo Roys PsyD  Chief Complaint:      Chief Complaint  Patient presents with  . Anxiety  . Depression  . Stress  . Fatigue  . Pain    Reason For Service:     Susan Soto is a 55 year old female referred by Susan Larsen, MD with [facility].  The patient has a history of sleep disturbance with reports of cognitive disturbance and bilateral carpal tunnel syndrome.  The patient has been out of work after taking a medical leave in 2017 but then returning to work in a different job.  The patient has a history of major depressive disorder, adult residual attention deficit disorder and potentially residual cognitive difficulties after being diagnosed and treated for breast cancer in 2009 with both chemotherapy and radiation.  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  Patient reports that she was diagnosed with inattentive type attention deficit disorder in her early 29s and always had significant weakness keeping up with paperwork and documentation with her job.  The patient works as a Education officer, [employer].  The patient reports that she also had significant issues with major depressive disorder.  She reports that she took a medical leave of absence in 2017 after developing significant exacerbation of her major depression  with her mother passing away.  The patient returned to work after couple of months with an opportunity for a different job that had less demands and lower caseload.  Patient reports that she continued to have difficulty keeping up with any kind of paperwork and her previous strategies for dealing with that were not very helpful.  The patient reports that if she is not taking any medications that she just stays in bed, has no energy and significant difficulty with motivation.  The patient reports that this was true even when she was a young child.  The patient reports that she had an exacerbation of her difficulties after her chemotherapy treatment for breast cancer.  The patient reports that initially she experienced "chemo brain" but got better and then in 2017 her symptoms returned and she deteriorated.  The patient reports that typically she would have times of "hyperfocus" to get caught up now she cannot "hyperfocus" and has difficulty keeping up with paperwork.  The patient reports that she made improvements while out on FMLA after her mother and close aunt died.  The patient reports that she has seen a psychiatrist for 20 years in [place].  Patient reports being extremely stressed at work and feeling like she is extremely behind on her caseload and paperwork.  Her job has allowed her to start coming in at 10 AM to help her get better sleep.  Even with this adjustment the patient has difficulty getting into work before 11 AM.  The patient reports that she cant get her thoughts focused enough to do anything even  at home.  She is trying to work long enough to retire early.   The patient is not sleeping well but no significant findings on sleep study beyond delayed REM onset.  The patient had been on extreme low fat diet for many years but has stopped this diet.    The patient now has sudden onset of anxiety and panic attacks.    03/28/20 Update:  The patient returned for visit and brought the  paperwork that she had forgotten in Feb.  The patient described a a decline in memory and executive functioning along with increasing anxiety and worsening of ADD/attention symptoms.  She describes new onset panic attacks within the past 6 months.  "pins and needles and pain in fingers described".  She describes a decline in her ability to perform tasks that involve concentration.  She continues to use Dexedrine for attention along with valium 1-2 times per day.  The patient had been out on FMLA due to major depresion and new onset of anxiety.  FMLA has run out and the patient has had to stop working but her short-term disability has denied her so far, attributing it to a pre-existing condition according to patient.  The patient describes severe insomnia (not OSA) but has had abnormalities on sleep study related to reduction in REM sleep.    04/11/2020 update: The patient return for a visit today and reports that she has been having some improvements in her sleep pattern and there has been some improvements in her fatigue during the day.  She has reduced how much stimulants she is taking as well as taking less Valium.  The patient reports that she continues to have significant difficulties getting going in the morning but she has been going to bed earlier at night and getting up in the morning sooner and on many days she is able to get up at sunrise.  She has been following sleep hygiene efforts.  05/26/2020: The patient reports that she is continued to have some improvements but continues with significant attentional issues and disturbed sleep.  The patient is continued with the lower dose of psychostimulant medications but this has a negative impact on her attention and concentration but does seem to help with her anxiety symptoms.  The patient has continued to work on sleep hygiene issues and has improved somewhat and will be following up with her neurologist soon.  06/20/2020: The patient reports that she has  continued to make improvements particular around her sleep functioning.  The patient reports that she went to the beach to spend some time as well as visit her friend and was away from her husband another household stressors.  She actively worked on sleep hygiene issues and focused on very fundamental behavioral consistencies.  She reports that she did much better as far as her anxiety and stress.  There continues to be a lot of frustration and stressors associated with her husband's activities and behaviors and the patient is working on more focus on taking care of herself rather than trying to fix all intervene with his issues that he himself is not acknowledging or actively trying to address.  09/08/2020: The patient missed her last appointment as she had documented the time in a confusing way.  It was an 8:00 appointment and she typically has great difficulty getting herself organized to these appointments and miss documented in her calendar and was going to be too late for the appointment before she realized this discrepancy.  The patient reports  that in someway she is done better and has been actively working on a number of things and has made more accomplishments in this period of time then she typically gives herself credit for.  The patient is done very well working on foundational issues and has been improving her behavioral aspects of sleep hygiene, paying close attention all to good nutrition and physical action and activity.  The patient reports that she continues to have difficulty maintaining attention and focus and there have been efforts to adjust some of her medications for optimization.  The patient reports that there continue to be significant stressors with her husband and her husband's psychiatric issues of severe OCD including hoarding type behaviors.  10/06/2020: The patient reports that she has continued to have significant issues of cognitive difficulties and fatigue during the day.   Patient continues to have sleep disturbance and we talked about some very practical issues around sleep hygiene including sleeping in her room without pets that she reports will wake her up at various times at night.  Patient continues to have significant pain continued severe sleep disturbance but has improved sleep hygiene and sleep pattern significantly since initially being here.  11/10/2020: The patient reports that she has been off track with regard to her sleep hygiene regimen and has noticed a worsening of her cognitive difficulties including attentional issues, anxiety stress, executive function etc. when she does not follow her sleep hygiene regimen is closely as she had been.  The patient reports that there have been medical complications recently with both her and her husband developing symptomatic COVID-19 infection with the patient having more than a week of illness.  During the time her husband was sick he did completely stop drinking which she was hopeful would lead to his continued cessation of alcohol use.  However, as soon as he felt well enough to go back to longstanding habits he did return to his alcohol use and consumption but initially restricted it to drinking beer but is now returned to his previous multi sources of alcohol use.  The patient reports that she has gotten better about being stressed over the impacts of his alcohol abuse realizing that there is little to nothing that she can do to manage or control his activities.  The patient is focused on her wellbeing overall and is learned a number of viable coping skills and implemented those to help her symptoms.  However, she continues to have significant attentional deficits and other cognitive deficits.  Treatment Interventions:  Cognitive Behavioral and coping strategies along with continued history.  Participation Level:   Active  Participation Quality:  Appropriate and Redirectable      Behavioral Observation:  Well  Groomed, Alert, and Appropriate.   Current Psychosocial Factors: The patient continues to struggle with basic status around the house and while they have improved and she has completed many more tasks than she had been she continues to have to work very hard to not get sidetracked in the middle of various tasks.  Content of Session:   Reviewed current symptoms and worked on coping and adjustment issues.    Effectiveness of Interventions: Patient is very motivated to improve and working hard on some of the challenging behavioral changes we have done around sleep hygiene.    Target Goals:   Improve sleep, work on anxiety and depression along with cognitive deficits.  Goals Last Reviewed:   11/10/2020  Goals Addressed Today:    Continued with sleep hygiene issues and concerns  around cognitive deficits and coping.  Impression/Diagnosis:   Tyshia Beiter is a 55 year old female referred by Susan Larsen, MD with [facility].  The patient has a history of sleep disturbance with reports of cognitive disturbance and bilateral carpal tunnel syndrome.  The patient has been out of work after taking a medical leave in 2017 but then returning to work in a different job.  The patient has a history of major depressive disorder, adult residual attention deficit disorder and potentially residual cognitive difficulties after being diagnosed and treated for breast cancer in 2009 with both chemotherapy and radiation.  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  The patient continues with significant anxiety, pain and sleep disturbance.  The patient has been actively working on therapeutic interventions today we worked on some very practical strategies for aiding her free recall of information and strategies for completing tasks.  10/06/2020: The patient reports  that she has continued to have significant issues of cognitive difficulties and fatigue during the day.  Patient continues to have sleep disturbance and we talked about some very practical issues around sleep hygiene including sleeping in her room without pets that she reports will wake her up at various times at night.  Patient continues to have significant pain continued severe sleep disturbance but has improved sleep hygiene and sleep pattern significantly since initially being here.  11/10/2020: The patient reports that she has been off track with regard to her sleep hygiene regimen and has noticed a worsening of her cognitive difficulties including attentional issues, anxiety stress, executive function etc. when she does not follow her sleep hygiene regimen is closely as she had been.  The patient reports that there have been medical complications recently with both her and her husband developing symptomatic COVID-19 infection with the patient having more than a week of illness.  During the time her husband was sick he did completely stop drinking which she was hopeful would lead to his continued cessation of alcohol use.  However, as soon as he felt well enough to go back to longstanding habits he did return to his alcohol use and consumption but initially restricted it to drinking beer but is now returned to his previous multi sources of alcohol use.  The patient reports that she has gotten better about being stressed over the impacts of his alcohol abuse realizing that there is little to nothing that she can do to manage or control his activities.  The patient is focused on her wellbeing overall and is learned a number of viable coping skills and implemented those to help her symptoms.  However, she continues to have significant attentional deficits and other cognitive deficits.  Diagnosis:   Cognitive and neurobehavioral dysfunction  Chronic fatigue  Sleep disturbance  Panic disorder with  agoraphobia  Generalized anxiety disorder  Attention deficit hyperactivity disorder (ADHD), predominantly inattentive type    Ilean Skill, Psy.D. Clinical Psychologist Neuropsychologist

## 2020-11-26 ENCOUNTER — Other Ambulatory Visit: Payer: Self-pay | Admitting: Internal Medicine

## 2020-11-30 ENCOUNTER — Telehealth: Payer: Self-pay | Admitting: Family Medicine

## 2020-11-30 NOTE — Telephone Encounter (Signed)
Patient is calling in stating that a thyroid medication that was written by another provider she was wanting to know if we could prescribe it to her she only has 3 days left.  Medication Levothyroxine Pharmacy: [facility] [address] [facility]

## 2020-11-30 NOTE — Telephone Encounter (Signed)
Patient states "That its not necessary for her to go back to Endocrinologist. She will not be going back to them." Patient returned the call and is requesting a call back to discuss. EM

## 2020-11-30 NOTE — Telephone Encounter (Signed)
After some research, I see there is note on last levothyroxine refill stating pt needs an appt for further refills.    Left message on vm per dpr relaying info above.  Suggested pt contact [facility] to schedule an appt and let them know she needs a refill.

## 2020-12-01 MED ORDER — LEVOTHYROXINE SODIUM 50 MCG PO TABS: 50.0000 ug | ORAL_TABLET | Freq: Every day | ORAL | 3 refills | Status: DC

## 2020-12-01 NOTE — Addendum Note (Signed)
Addended by: Brenton Grills on: 8/59/0931 12:16 AM   Modules accepted: Orders

## 2020-12-01 NOTE — Telephone Encounter (Signed)
Returned pt's call.  States she does not have any f/u appts with endo.  Confirms she is on 50 mcg dose.  She's requesting refill sent from Dr. Darnell Level.    E-scribed refill.

## 2021-01-05 ENCOUNTER — Encounter: Payer: Managed Care, Other (non HMO) | Attending: Psychology | Admitting: Psychology

## 2021-01-05 ENCOUNTER — Encounter: Payer: Self-pay | Admitting: Psychology

## 2021-01-05 ENCOUNTER — Other Ambulatory Visit: Payer: Self-pay

## 2021-01-05 DIAGNOSIS — R5382 Chronic fatigue, unspecified: Secondary | ICD-10-CM | POA: Insufficient documentation

## 2021-01-05 DIAGNOSIS — F09 Unspecified mental disorder due to known physiological condition: Secondary | ICD-10-CM | POA: Insufficient documentation

## 2021-01-05 DIAGNOSIS — F411 Generalized anxiety disorder: Secondary | ICD-10-CM | POA: Insufficient documentation

## 2021-01-05 DIAGNOSIS — F4001 Agoraphobia with panic disorder: Secondary | ICD-10-CM | POA: Diagnosis present

## 2021-01-05 DIAGNOSIS — G479 Sleep disorder, unspecified: Secondary | ICD-10-CM | POA: Insufficient documentation

## 2021-01-05 NOTE — Progress Notes (Signed)
Neuropsychology Visit  Patient:  Susan Soto   DOB: 1966-04-22  MR Number: [id number]  Location: [facility] [place], [place] [id number] [place] [id number] Dept: [phone]  Date of Service: 01/05/2021  Start: 11 AM End: 12 PM  Duration of Service: 1 Hour  Today's visit was a 1 hour visit was conducted in my outpatient clinic office with myself and the patient present.    Provider/Observer:     Edgardo Roys PsyD  Chief Complaint:      Chief Complaint  Patient presents with  . Anxiety  . Depression  . Stress  . Other    Reason For Service:     Khrista Braun is a 55 year old female referred by Daymon Larsen, MD with [facility].  The patient has a history of sleep disturbance with reports of cognitive disturbance and bilateral carpal tunnel syndrome.  The patient has been out of work after taking a medical leave in 2017 but then returning to work in a different job.  The patient has a history of major depressive disorder, adult residual attention deficit disorder and potentially residual cognitive difficulties after being diagnosed and treated for breast cancer in 2009 with both chemotherapy and radiation.  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  Patient reports that she was diagnosed with inattentive type attention deficit disorder in her early 81s and always had significant weakness keeping up with paperwork and documentation with her job.  The patient works as a Education officer, [employer].  The patient reports that she also had significant issues with major depressive disorder.  She reports that she took a medical leave of absence in 2017 after developing significant exacerbation of her major depression with her  mother passing away.  The patient returned to work after couple of months with an opportunity for a different job that had less demands and lower caseload.  Patient reports that she continued to have difficulty keeping up with any kind of paperwork and her previous strategies for dealing with that were not very helpful.  The patient reports that if she is not taking any medications that she just stays in bed, has no energy and significant difficulty with motivation.  The patient reports that this was true even when she was a young child.  The patient reports that she had an exacerbation of her difficulties after her chemotherapy treatment for breast cancer.  The patient reports that initially she experienced "chemo brain" but got better and then in 2017 her symptoms returned and she deteriorated.  The patient reports that typically she would have times of "hyperfocus" to get caught up now she cannot "hyperfocus" and has difficulty keeping up with paperwork.  The patient reports that she made improvements while out on FMLA after her mother and close aunt died.  The patient reports that she has seen a psychiatrist for 20 years in [place].  Patient reports being extremely stressed at work and feeling like she is extremely behind on her caseload and paperwork.  Her job has allowed her to start coming in at 10 AM to help her get better sleep.  Even with this adjustment the patient has difficulty getting into work before 11 AM.  The patient reports that she cant get her thoughts focused enough to do anything even at  home.  She is trying to work long enough to retire early.   The patient is not sleeping well but no significant findings on sleep study beyond delayed REM onset.  The patient had been on extreme low fat diet for many years but has stopped this diet.    The patient now has sudden onset of anxiety and panic attacks.    03/28/20 Update:  The patient returned for visit and brought the paperwork  that she had forgotten in Feb.  The patient described a a decline in memory and executive functioning along with increasing anxiety and worsening of ADD/attention symptoms.  She describes new onset panic attacks within the past 6 months.  "pins and needles and pain in fingers described".  She describes a decline in her ability to perform tasks that involve concentration.  She continues to use Dexedrine for attention along with valium 1-2 times per day.  The patient had been out on FMLA due to major depresion and new onset of anxiety.  FMLA has run out and the patient has had to stop working but her short-term disability has denied her so far, attributing it to a pre-existing condition according to patient.  The patient describes severe insomnia (not OSA) but has had abnormalities on sleep study related to reduction in REM sleep.    04/11/2020 update: The patient return for a visit today and reports that she has been having some improvements in her sleep pattern and there has been some improvements in her fatigue during the day.  She has reduced how much stimulants she is taking as well as taking less Valium.  The patient reports that she continues to have significant difficulties getting going in the morning but she has been going to bed earlier at night and getting up in the morning sooner and on many days she is able to get up at sunrise.  She has been following sleep hygiene efforts.  05/26/2020: The patient reports that she is continued to have some improvements but continues with significant attentional issues and disturbed sleep.  The patient is continued with the lower dose of psychostimulant medications but this has a negative impact on her attention and concentration but does seem to help with her anxiety symptoms.  The patient has continued to work on sleep hygiene issues and has improved somewhat and will be following up with her neurologist soon.  06/20/2020: The patient reports that she has continued  to make improvements particular around her sleep functioning.  The patient reports that she went to the beach to spend some time as well as visit her friend and was away from her husband another household stressors.  She actively worked on sleep hygiene issues and focused on very fundamental behavioral consistencies.  She reports that she did much better as far as her anxiety and stress.  There continues to be a lot of frustration and stressors associated with her husband's activities and behaviors and the patient is working on more focus on taking care of herself rather than trying to fix all intervene with his issues that he himself is not acknowledging or actively trying to address.  09/08/2020: The patient missed her last appointment as she had documented the time in a confusing way.  It was an 8:00 appointment and she typically has great difficulty getting herself organized to these appointments and miss documented in her calendar and was going to be too late for the appointment before she realized this discrepancy.  The patient reports that  in [place] she is done better and has been actively working on a number of things and has made more accomplishments in this period of time then she typically gives herself credit for.  The patient is done very well working on foundational issues and has been improving her behavioral aspects of sleep hygiene, paying close attention all to good nutrition and physical action and activity.  The patient reports that she continues to have difficulty maintaining attention and focus and there have been efforts to adjust some of her medications for optimization.  The patient reports that there continue to be significant stressors with her husband and her husband's psychiatric issues of severe OCD including hoarding type behaviors.  10/06/2020: The patient reports that she has continued to have significant issues of cognitive difficulties and fatigue during the day.  Patient  continues to have sleep disturbance and we talked about some very practical issues around sleep hygiene including sleeping in her room without pets that she reports will wake her up at various times at night.  Patient continues to have significant pain continued severe sleep disturbance but has improved sleep hygiene and sleep pattern significantly since initially being here.  11/10/2020: The patient reports that she has been off track with regard to her sleep hygiene regimen and has noticed a worsening of her cognitive difficulties including attentional issues, anxiety stress, executive function etc. when she does not follow her sleep hygiene regimen is closely as she had been.  The patient reports that there have been medical complications recently with both her and her husband developing symptomatic COVID-19 infection with the patient having more than a week of illness.  During the time her husband was sick he did completely stop drinking which she was hopeful would lead to his continued cessation of alcohol use.  However, as soon as he felt well enough to go back to longstanding habits he did return to his alcohol use and consumption but initially restricted it to drinking beer but is now returned to his previous multi sources of alcohol use.  The patient reports that she has gotten better about being stressed over the impacts of his alcohol abuse realizing that there is little to nothing that she can do to manage or control his activities.  The patient is focused on her wellbeing overall and is learned a number of viable coping skills and implemented those to help her symptoms.  However, she continues to have significant attentional deficits and other cognitive deficits.  01/05/2021: The patient appeared to be in some of the best status she has been since I started seeing her.  Her anxiety was down and she had good cognitive and mental status.  The patient's mood was better and she reports that she has been  sleeping better although recently she has not been doing all of the sleep hygiene that she had done to affect improved sleep patterns.  Psychosocial stressors have been a little bit better with her husband returning to work in his office during the day which gives her a break from being around him 24/7.  Her husband continues to consume significant amounts of alcohol which limit their healthy interactions.  Treatment Interventions:  Cognitive Behavioral and coping strategies along with continued history.  Participation Level:   Active  Participation Quality:  Appropriate and Redirectable      Behavioral Observation:  Well Groomed, Alert, and Appropriate.   Current Psychosocial Factors: The patient continues to struggle with basic status around the house and while  they have improved and she has completed many more tasks than she had been she continues to have to work very hard to not get sidetracked in the middle of various tasks.  Content of Session:   Reviewed current symptoms and worked on coping and adjustment issues.    Effectiveness of Interventions: Patient is very motivated to improve and working hard on some of the challenging behavioral changes we have done around sleep hygiene.    Target Goals:   Improve sleep, work on anxiety and depression along with cognitive deficits.  Goals Last Reviewed:   01/05/2021  Goals Addressed Today:    Continued with sleep hygiene issues and concerns around cognitive deficits and coping.  Impression/Diagnosis:   Deetya Drouillard is a 55 year old female referred by Daymon Larsen, MD with [facility].  The patient has a history of sleep disturbance with reports of cognitive disturbance and bilateral carpal tunnel syndrome.  The patient has been out of work after taking a medical leave in 2017 but then returning to work in a different job.  The patient has a history of major depressive disorder, adult residual attention deficit disorder and  potentially residual cognitive difficulties after being diagnosed and treated for breast cancer in 2009 with both chemotherapy and radiation.  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  The patient continues with significant anxiety, pain and sleep disturbance.  The patient has been actively working on therapeutic interventions today we worked on some very practical strategies for aiding her free recall of information and strategies for completing tasks.  10/06/2020: The patient reports that she has continued to have significant issues of cognitive difficulties and fatigue during the day.  Patient continues to have sleep disturbance and we talked about some very practical issues around sleep hygiene including sleeping in her room without pets that she reports will wake her up at various times at night.  Patient continues to have significant pain continued severe sleep disturbance but has improved sleep hygiene and sleep pattern significantly since initially being here.  11/10/2020: The patient reports that she has been off track with regard to her sleep hygiene regimen and has noticed a worsening of her cognitive difficulties including attentional issues, anxiety stress, executive function etc. when she does not follow her sleep hygiene regimen is closely as she had been.  The patient reports that there have been medical complications recently with both her and her husband developing symptomatic COVID-19 infection with the patient having more than a week of illness.  During the time her husband was sick he did completely stop drinking which she was hopeful would lead to his continued cessation of alcohol use.  However, as soon as he felt well enough to go back to longstanding habits he did return to his alcohol use and consumption but initially restricted it to  drinking beer but is now returned to his previous multi sources of alcohol use.  The patient reports that she has gotten better about being stressed over the impacts of his alcohol abuse realizing that there is little to nothing that she can do to manage or control his activities.  The patient is focused on her wellbeing overall and is learned a number of viable coping skills and implemented those to help her symptoms.  However, she continues to have significant attentional deficits and other cognitive deficits.  01/05/2021: The patient appeared  to be in some of the best status she has been since I started seeing her.  Her anxiety was down and she had good cognitive and mental status.  The patient's mood was better and she reports that she has been sleeping better although recently she has not been doing all of the sleep hygiene that she had done to affect improved sleep patterns.  Psychosocial stressors have been a little bit better with her husband returning to work in his office during the day which gives her a break from being around him 24/7.  Her husband continues to consume significant amounts of alcohol which limit their healthy interactions.  Diagnosis:   Cognitive and neurobehavioral dysfunction  Chronic fatigue  Sleep disturbance  Panic disorder with agoraphobia  Generalized anxiety disorder    Ilean Skill, Psy.D. Clinical Psychologist Neuropsychologist

## 2021-01-12 DIAGNOSIS — Z0289 Encounter for other administrative examinations: Secondary | ICD-10-CM

## 2021-02-16 ENCOUNTER — Encounter: Payer: Self-pay | Admitting: Psychology

## 2021-02-16 ENCOUNTER — Other Ambulatory Visit: Payer: Self-pay

## 2021-02-16 ENCOUNTER — Encounter: Payer: Managed Care, Other (non HMO) | Attending: Psychology | Admitting: Psychology

## 2021-02-16 DIAGNOSIS — F09 Unspecified mental disorder due to known physiological condition: Secondary | ICD-10-CM | POA: Diagnosis present

## 2021-02-16 DIAGNOSIS — R5382 Chronic fatigue, unspecified: Secondary | ICD-10-CM | POA: Diagnosis present

## 2021-02-16 DIAGNOSIS — R4189 Other symptoms and signs involving cognitive functions and awareness: Secondary | ICD-10-CM

## 2021-02-16 DIAGNOSIS — F411 Generalized anxiety disorder: Secondary | ICD-10-CM

## 2021-02-16 DIAGNOSIS — G479 Sleep disorder, unspecified: Secondary | ICD-10-CM | POA: Diagnosis present

## 2021-02-16 DIAGNOSIS — F4001 Agoraphobia with panic disorder: Secondary | ICD-10-CM | POA: Diagnosis present

## 2021-02-16 NOTE — Progress Notes (Signed)
Neuropsychology Visit  Patient:  Susan Soto   DOB: 1966/08/01  MR Number: [id number]  Location: [facility] [place], [place] [id number] [place] [id number] Dept: [phone]  Date of Service: 02/16/2021  Start: 9 AM End: 10 AM  Duration of Service: 1 Hour  Today's visit was a 1 hour in person visit that was conducted in my outpatient clinic office with the patient myself present.  Provider/Observer:     Edgardo Roys PsyD  Chief Complaint:      Chief Complaint  Patient presents with  . Anxiety  . Depression  . Pain  . Stress  . Sleeping Problem    Reason For Service:     Susan Soto is a 55 year old female referred by Daymon Larsen, MD with [facility].  The patient has a history of sleep disturbance with reports of cognitive disturbance and bilateral carpal tunnel syndrome.  The patient has been out of work after taking a medical leave in 2017 but then returning to work in a different job.  The patient has a history of major depressive disorder, adult residual attention deficit disorder and potentially residual cognitive difficulties after being diagnosed and treated for breast cancer in 2009 with both chemotherapy and radiation.  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  Patient reports that she was diagnosed with inattentive type attention deficit disorder in her early 9s and always had significant weakness keeping up with paperwork and documentation with her job.  The patient works as a Education officer, [employer].  The patient reports that she also had significant issues with major depressive disorder.  She reports that she took a medical leave of absence in 2017 after developing significant exacerbation of  her major depression with her mother passing away.  The patient returned to work after couple of months with an opportunity for a different job that had less demands and lower caseload.  Patient reports that she continued to have difficulty keeping up with any kind of paperwork and her previous strategies for dealing with that were not very helpful.  The patient reports that if she is not taking any medications that she just stays in bed, has no energy and significant difficulty with motivation.  The patient reports that this was true even when she was a young child.  The patient reports that she had an exacerbation of her difficulties after her chemotherapy treatment for breast cancer.  The patient reports that initially she experienced "chemo brain" but got better and then in 2017 her symptoms returned and she deteriorated.  The patient reports that typically she would have times of "hyperfocus" to get caught up now she cannot "hyperfocus" and has difficulty keeping up with paperwork.  The patient reports that she made improvements while out on FMLA after her mother and close aunt died.  The patient reports that she has seen a psychiatrist for 20 years in [place].  Patient reports being extremely stressed at work and feeling like she is extremely behind on her caseload and paperwork.  Her job has allowed her to start coming in at 10 AM to help her get better sleep.  Even with this adjustment the patient has difficulty getting into work before 11 AM.  The patient reports that she cant get her thoughts focused enough to  do anything even at home.  She is trying to work long enough to retire early.   The patient is not sleeping well but no significant findings on sleep study beyond delayed REM onset.  The patient had been on extreme low fat diet for many years but has stopped this diet.    The patient now has sudden onset of anxiety and panic attacks.    03/28/20 Update:  The patient returned for visit  and brought the paperwork that she had forgotten in Feb.  The patient described a a decline in memory and executive functioning along with increasing anxiety and worsening of ADD/attention symptoms.  She describes new onset panic attacks within the past 6 months.  "pins and needles and pain in fingers described".  She describes a decline in her ability to perform tasks that involve concentration.  She continues to use Dexedrine for attention along with valium 1-2 times per day.  The patient had been out on FMLA due to major depresion and new onset of anxiety.  FMLA has run out and the patient has had to stop working but her short-term disability has denied her so far, attributing it to a pre-existing condition according to patient.  The patient describes severe insomnia (not OSA) but has had abnormalities on sleep study related to reduction in REM sleep.    04/11/2020 update: The patient return for a visit today and reports that she has been having some improvements in her sleep pattern and there has been some improvements in her fatigue during the day.  She has reduced how much stimulants she is taking as well as taking less Valium.  The patient reports that she continues to have significant difficulties getting going in the morning but she has been going to bed earlier at night and getting up in the morning sooner and on many days she is able to get up at sunrise.  She has been following sleep hygiene efforts.  05/26/2020: The patient reports that she is continued to have some improvements but continues with significant attentional issues and disturbed sleep.  The patient is continued with the lower dose of psychostimulant medications but this has a negative impact on her attention and concentration but does seem to help with her anxiety symptoms.  The patient has continued to work on sleep hygiene issues and has improved somewhat and will be following up with her neurologist soon.  06/20/2020: The patient  reports that she has continued to make improvements particular around her sleep functioning.  The patient reports that she went to the beach to spend some time as well as visit her friend and was away from her husband another household stressors.  She actively worked on sleep hygiene issues and focused on very fundamental behavioral consistencies.  She reports that she did much better as far as her anxiety and stress.  There continues to be a lot of frustration and stressors associated with her husband's activities and behaviors and the patient is working on more focus on taking care of herself rather than trying to fix all intervene with his issues that he himself is not acknowledging or actively trying to address.  09/08/2020: The patient missed her last appointment as she had documented the time in a confusing way.  It was an 8:00 appointment and she typically has great difficulty getting herself organized to these appointments and miss documented in her calendar and was going to be too late for the appointment before she realized this discrepancy.  The patient reports that in someway she is done better and has been actively working on a number of things and has made more accomplishments in this period of time then she typically gives herself credit for.  The patient is done very well working on foundational issues and has been improving her behavioral aspects of sleep hygiene, paying close attention all to good nutrition and physical action and activity.  The patient reports that she continues to have difficulty maintaining attention and focus and there have been efforts to adjust some of her medications for optimization.  The patient reports that there continue to be significant stressors with her husband and her husband's psychiatric issues of severe OCD including hoarding type behaviors.  10/06/2020: The patient reports that she has continued to have significant issues of cognitive difficulties and  fatigue during the day.  Patient continues to have sleep disturbance and we talked about some very practical issues around sleep hygiene including sleeping in her room without pets that she reports will wake her up at various times at night.  Patient continues to have significant pain continued severe sleep disturbance but has improved sleep hygiene and sleep pattern significantly since initially being here.  11/10/2020: The patient reports that she has been off track with regard to her sleep hygiene regimen and has noticed a worsening of her cognitive difficulties including attentional issues, anxiety stress, executive function etc. when she does not follow her sleep hygiene regimen is closely as she had been.  The patient reports that there have been medical complications recently with both her and her husband developing symptomatic COVID-19 infection with the patient having more than a week of illness.  During the time her husband was sick he did completely stop drinking which she was hopeful would lead to his continued cessation of alcohol use.  However, as soon as he felt well enough to go back to longstanding habits he did return to his alcohol use and consumption but initially restricted it to drinking beer but is now returned to his previous multi sources of alcohol use.  The patient reports that she has gotten better about being stressed over the impacts of his alcohol abuse realizing that there is little to nothing that she can do to manage or control his activities.  The patient is focused on her wellbeing overall and is learned a number of viable coping skills and implemented those to help her symptoms.  However, she continues to have significant attentional deficits and other cognitive deficits.  01/05/2021: The patient appeared to be in some of the best status she has been since I started seeing her.  Her anxiety was down and she had good cognitive and mental status.  The patient's mood was better  and she reports that she has been sleeping better although recently she has not been doing all of the sleep hygiene that she had done to affect improved sleep patterns.  Psychosocial stressors have been a little bit better with her husband returning to work in his office during the day which gives her a break from being around him 24/7.  Her husband continues to consume significant amounts of alcohol which limit their healthy interactions.  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  The patient reports that things have continued to be very similar at home although her husband has gone back to work.  The patient also house that for a friend for a weekend that also went very well.  The patient reports that she has had times where her anxiety seems to exacerbate but overall she continues to do better.  Treatment Interventions:  Cognitive Behavioral and coping strategies along with continued history.  Participation Level:   Active  Participation Quality:  Appropriate and Redirectable      Behavioral Observation:  Well Groomed, Alert, and Appropriate.   Current Psychosocial Factors: The patient continues to struggle with basic status around the house and while they have improved and she has completed many more tasks than she had been she continues to have to work very hard to not get sidetracked in the middle of various tasks.  Content of Session:   Reviewed current symptoms and worked on coping and adjustment issues.    Effectiveness of Interventions: Patient is very motivated to improve and working hard on some of the challenging behavioral changes we have done around sleep hygiene.    Target Goals:   Improve sleep, work on anxiety and  depression along with cognitive deficits.  Goals Last Reviewed:   01/05/2021  Goals Addressed Today:    Continued with sleep hygiene issues and concerns around cognitive deficits and coping.  Impression/Diagnosis:   Jalina Blowers is a 55 year old female referred by Daymon Larsen, MD with [facility].  The patient has a history of sleep disturbance with reports of cognitive disturbance and bilateral carpal tunnel syndrome.  The patient has been out of work after taking a medical leave in 2017 but then returning to work in a different job.  The patient has a history of major depressive disorder, adult residual attention deficit disorder and potentially residual cognitive difficulties after being diagnosed and treated for breast cancer in 2009 with both chemotherapy and radiation.  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  The patient continues with significant anxiety, pain and sleep disturbance.  The patient has been actively working on therapeutic interventions today we worked on some very practical strategies for aiding her free recall of information and strategies for completing tasks.  10/06/2020: The patient reports that she has continued to have significant issues of cognitive difficulties and fatigue during the day.  Patient continues to have sleep disturbance and we talked about some very practical issues around sleep hygiene including sleeping in her room without pets that she reports will wake her up at various times at night.  Patient continues to have significant pain continued severe sleep disturbance but has improved sleep hygiene and sleep pattern significantly since initially being here.  11/10/2020: The patient reports that she has been off track with regard to her sleep hygiene regimen and has noticed a worsening of her  cognitive difficulties including attentional issues, anxiety stress, executive function etc. when she does not follow her sleep hygiene regimen is closely as she had been.  The patient reports that there have been medical complications recently with both her and her husband developing symptomatic COVID-19 infection with the patient having more than a week of illness.  During the time her husband was sick he did completely stop drinking which she was hopeful would lead to his continued cessation of  alcohol use.  However, as soon as he felt well enough to go back to longstanding habits he did return to his alcohol use and consumption but initially restricted it to drinking beer but is now returned to his previous multi sources of alcohol use.  The patient reports that she has gotten better about being stressed over the impacts of his alcohol abuse realizing that there is little to nothing that she can do to manage or control his activities.  The patient is focused on her wellbeing overall and is learned a number of viable coping skills and implemented those to help her symptoms.  However, she continues to have significant attentional deficits and other cognitive deficits.  01/05/2021: The patient appeared to be in some of the best status she has been since I started seeing her.  Her anxiety was down and she had good cognitive and mental status.  The patient's mood was better and she reports that she has been sleeping better although recently she has not been doing all of the sleep hygiene that she had done to affect improved sleep patterns.  Psychosocial stressors have been a little bit better with her husband returning to work in his office during the day which gives her a break from being around him 24/7.  Her husband continues to consume significant amounts of alcohol which limit their healthy interactions.  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  The patient reports that things have continued to be very similar at home although her husband has gone back to work.  The patient also house that for a friend for a weekend that also went very well.  The patient reports that she has had times where her anxiety seems to exacerbate but overall she continues to do better.  Diagnosis:   Cognitive and neurobehavioral dysfunction  Chronic fatigue  Sleep disturbance  Panic disorder with agoraphobia  Generalized anxiety disorder    Ilean Skill, Psy.D. Clinical Psychologist Neuropsychologist

## 2021-03-27 ENCOUNTER — Other Ambulatory Visit: Payer: Self-pay

## 2021-03-27 ENCOUNTER — Encounter: Payer: Managed Care, Other (non HMO) | Attending: Psychology | Admitting: Psychology

## 2021-03-27 DIAGNOSIS — F09 Unspecified mental disorder due to known physiological condition: Secondary | ICD-10-CM | POA: Diagnosis not present

## 2021-03-27 DIAGNOSIS — F4001 Agoraphobia with panic disorder: Secondary | ICD-10-CM | POA: Diagnosis present

## 2021-03-27 DIAGNOSIS — F411 Generalized anxiety disorder: Secondary | ICD-10-CM | POA: Insufficient documentation

## 2021-03-27 DIAGNOSIS — F900 Attention-deficit hyperactivity disorder, predominantly inattentive type: Secondary | ICD-10-CM | POA: Insufficient documentation

## 2021-03-27 DIAGNOSIS — G479 Sleep disorder, unspecified: Secondary | ICD-10-CM | POA: Insufficient documentation

## 2021-03-27 DIAGNOSIS — R5382 Chronic fatigue, unspecified: Secondary | ICD-10-CM | POA: Diagnosis not present

## 2021-03-29 ENCOUNTER — Encounter: Payer: Self-pay | Admitting: Psychology

## 2021-03-29 NOTE — Progress Notes (Signed)
Neuropsychology Visit  Patient:  Susan Soto   DOB: 1966/08/24  MR Number: [id number]  Location: [facility] [place], [place] [id number] [place] [id number] Dept: [phone]  Date of Service: 04/26/2021  Start: 2 PM End: 3 PM  Duration of Service: 1 Hour  Today's visit was a 1 hour in person visit that was conducted in my outpatient clinic office with the patient myself present.  Provider/Observer:     Edgardo Roys PsyD  Chief Complaint:      Chief Complaint  Patient presents with  . Anxiety  . Depression  . Sleeping Problem  . Stress  . Pain    Reason For Service:     Susan Soto is a 55 year old female referred by Daymon Larsen, MD with [facility].  The patient has a history of sleep disturbance with reports of cognitive disturbance and bilateral carpal tunnel syndrome.  The patient has been out of work after taking a medical leave in 2017 but then returning to work in a different job.  The patient has a history of major depressive disorder, adult residual attention deficit disorder and potentially residual cognitive difficulties after being diagnosed and treated for breast cancer in 2009 with both chemotherapy and radiation.  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  Patient reports that she was diagnosed with inattentive type attention deficit disorder in her early 59s and always had significant weakness keeping up with paperwork and documentation with her job.  The patient works as a Education officer, [employer].  The patient reports that she also had significant issues with major depressive disorder.  She reports that she took a medical leave of absence in 2017 after developing significant exacerbation of her  major depression with her mother passing away.  The patient returned to work after couple of months with an opportunity for a different job that had less demands and lower caseload.  Patient reports that she continued to have difficulty keeping up with any kind of paperwork and her previous strategies for dealing with that were not very helpful.  The patient reports that if she is not taking any medications that she just stays in bed, has no energy and significant difficulty with motivation.  The patient reports that this was true even when she was a young child.  The patient reports that she had an exacerbation of her difficulties after her chemotherapy treatment for breast cancer.  The patient reports that initially she experienced "chemo brain" but got better and then in 2017 her symptoms returned and she deteriorated.  The patient reports that typically she would have times of "hyperfocus" to get caught up now she cannot "hyperfocus" and has difficulty keeping up with paperwork.  The patient reports that she made improvements while out on FMLA after her mother and close aunt died.  The patient reports that she has seen a psychiatrist for 20 years in [place].  Patient reports being extremely stressed at work and feeling like she is extremely behind on her caseload and paperwork.  Her job has allowed her to start coming in at 10 AM to help her get better sleep.  Even with this adjustment the patient has difficulty getting into work before 11 AM.  The patient reports that she cant get her thoughts focused enough to  do anything even at home.  She is trying to work long enough to retire early.   The patient is not sleeping well but no significant findings on sleep study beyond delayed REM onset.  The patient had been on extreme low fat diet for many years but has stopped this diet.    The patient now has sudden onset of anxiety and panic attacks.    03/28/20 Update:  The patient returned for visit and  brought the paperwork that she had forgotten in Feb.  The patient described a a decline in memory and executive functioning along with increasing anxiety and worsening of ADD/attention symptoms.  She describes new onset panic attacks within the past 6 months.  "pins and needles and pain in fingers described".  She describes a decline in her ability to perform tasks that involve concentration.  She continues to use Dexedrine for attention along with valium 1-2 times per day.  The patient had been out on FMLA due to major depresion and new onset of anxiety.  FMLA has run out and the patient has had to stop working but her short-term disability has denied her so far, attributing it to a pre-existing condition according to patient.  The patient describes severe insomnia (not OSA) but has had abnormalities on sleep study related to reduction in REM sleep.    04/11/2020 update: The patient return for a visit today and reports that she has been having some improvements in her sleep pattern and there has been some improvements in her fatigue during the day.  She has reduced how much stimulants she is taking as well as taking less Valium.  The patient reports that she continues to have significant difficulties getting going in the morning but she has been going to bed earlier at night and getting up in the morning sooner and on many days she is able to get up at sunrise.  She has been following sleep hygiene efforts.  05/26/2020: The patient reports that she is continued to have some improvements but continues with significant attentional issues and disturbed sleep.  The patient is continued with the lower dose of psychostimulant medications but this has a negative impact on her attention and concentration but does seem to help with her anxiety symptoms.  The patient has continued to work on sleep hygiene issues and has improved somewhat and will be following up with her neurologist soon.  06/20/2020: The patient reports  that she has continued to make improvements particular around her sleep functioning.  The patient reports that she went to the beach to spend some time as well as visit her friend and was away from her husband another household stressors.  She actively worked on sleep hygiene issues and focused on very fundamental behavioral consistencies.  She reports that she did much better as far as her anxiety and stress.  There continues to be a lot of frustration and stressors associated with her husband's activities and behaviors and the patient is working on more focus on taking care of herself rather than trying to fix all intervene with his issues that he himself is not acknowledging or actively trying to address.  09/08/2020: The patient missed her last appointment as she had documented the time in a confusing way.  It was an 8:00 appointment and she typically has great difficulty getting herself organized to these appointments and miss documented in her calendar and was going to be too late for the appointment before she realized this discrepancy.  The patient reports that in someway she is done better and has been actively working on a number of things and has made more accomplishments in this period of time then she typically gives herself credit for.  The patient is done very well working on foundational issues and has been improving her behavioral aspects of sleep hygiene, paying close attention all to good nutrition and physical action and activity.  The patient reports that she continues to have difficulty maintaining attention and focus and there have been efforts to adjust some of her medications for optimization.  The patient reports that there continue to be significant stressors with her husband and her husband's psychiatric issues of severe OCD including hoarding type behaviors.  10/06/2020: The patient reports that she has continued to have significant issues of cognitive difficulties and fatigue during  the day.  Patient continues to have sleep disturbance and we talked about some very practical issues around sleep hygiene including sleeping in her room without pets that she reports will wake her up at various times at night.  Patient continues to have significant pain continued severe sleep disturbance but has improved sleep hygiene and sleep pattern significantly since initially being here.  11/10/2020: The patient reports that she has been off track with regard to her sleep hygiene regimen and has noticed a worsening of her cognitive difficulties including attentional issues, anxiety stress, executive function etc. when she does not follow her sleep hygiene regimen is closely as she had been.  The patient reports that there have been medical complications recently with both her and her husband developing symptomatic COVID-19 infection with the patient having more than a week of illness.  During the time her husband was sick he did completely stop drinking which she was hopeful would lead to his continued cessation of alcohol use.  However, as soon as he felt well enough to go back to longstanding habits he did return to his alcohol use and consumption but initially restricted it to drinking beer but is now returned to his previous multi sources of alcohol use.  The patient reports that she has gotten better about being stressed over the impacts of his alcohol abuse realizing that there is little to nothing that she can do to manage or control his activities.  The patient is focused on her wellbeing overall and is learned a number of viable coping skills and implemented those to help her symptoms.  However, she continues to have significant attentional deficits and other cognitive deficits.  01/05/2021: The patient appeared to be in some of the best status she has been since I started seeing her.  Her anxiety was down and she had good cognitive and mental status.  The patient's mood was better and she reports  that she has been sleeping better although recently she has not been doing all of the sleep hygiene that she had done to affect improved sleep patterns.  Psychosocial stressors have been a little bit better with her husband returning to work in his office during the day which gives her a break from being around him 24/7.  Her husband continues to consume significant amounts of alcohol which limit their healthy interactions.  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  The patient reports that things have continued to be very similar at home although her husband has gone back to work.  The patient also house that for a friend for a weekend that also went very well.  The patient reports that she has had times where her anxiety seems to exacerbate but overall she continues to do better.  04/26/2021: The patient reports that her uncle is moved into the office structure that they built behind their house that was originally built to house her husband's home office that was not being used.  She reports that initially her husband was resistant to this but decided it was okay.  She reports that overall this is turned out well and it is helped motivate her in some aspects.  The patient continues to struggle with completing task but was able to help get the structure ready for her uncle.  He has not overextended himself into their day-to-day lives and it does appear to be going fairly well.  The patient reports that her pain continues as well as stress of depression but she has been having fewer panic attacks.  Treatment Interventions:  Cognitive Behavioral and coping strategies along with continued history.  Participation Level:   Active  Participation  Quality:  Appropriate and Redirectable      Behavioral Observation:  Well Groomed, Alert, and Appropriate.   Current Psychosocial Factors: The patient continues to struggle with basic status around the house and while they have improved and she has completed many more tasks than she had been she continues to have to work very hard to not get sidetracked in the middle of various tasks.  Content of Session:   Reviewed current symptoms and worked on coping and adjustment issues.    Effectiveness of Interventions: Patient is very motivated to improve and working hard on some of the challenging behavioral changes we have done around sleep hygiene.    Target Goals:   Improve sleep, work on anxiety and depression along with cognitive deficits.  Goals Last Reviewed:   04/26/2021  Goals Addressed Today:    Continued with sleep hygiene issues and concerns around cognitive deficits and coping.  Impression/Diagnosis:   Chermaine Schnyder is a 55 year old female referred by Daymon Larsen, MD with [facility].  The patient has a history of sleep disturbance with reports of cognitive disturbance and bilateral carpal tunnel syndrome.  The patient has been out of work after taking a medical leave in 2017 but then returning to work in a different job.  The patient has a history of major depressive disorder, adult residual attention deficit disorder and potentially residual cognitive difficulties after being diagnosed and treated for breast cancer in 2009 with both chemotherapy and radiation.  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  The patient continues with significant anxiety, pain and sleep disturbance.  The patient has been actively working on therapeutic interventions today we worked on some very practical strategies for aiding her free recall of  information and strategies for completing tasks.  10/06/2020: The patient reports that she has continued to have significant issues of cognitive difficulties and fatigue during the day.  Patient continues to have sleep disturbance and we talked about some very practical issues around sleep hygiene including sleeping in her room without pets that she reports will wake her up at various  times at night.  Patient continues to have significant pain continued severe sleep disturbance but has improved sleep hygiene and sleep pattern significantly since initially being here.  11/10/2020: The patient reports that she has been off track with regard to her sleep hygiene regimen and has noticed a worsening of her cognitive difficulties including attentional issues, anxiety stress, executive function etc. when she does not follow her sleep hygiene regimen is closely as she had been.  The patient reports that there have been medical complications recently with both her and her husband developing symptomatic COVID-19 infection with the patient having more than a week of illness.  During the time her husband was sick he did completely stop drinking which she was hopeful would lead to his continued cessation of alcohol use.  However, as soon as he felt well enough to go back to longstanding habits he did return to his alcohol use and consumption but initially restricted it to drinking beer but is now returned to his previous multi sources of alcohol use.  The patient reports that she has gotten better about being stressed over the impacts of his alcohol abuse realizing that there is little to nothing that she can do to manage or control his activities.  The patient is focused on her wellbeing overall and is learned a number of viable coping skills and implemented those to help her symptoms.  However, she continues to have significant attentional deficits and other cognitive deficits.  01/05/2021: The patient appeared to be in  some of the best status she has been since I started seeing her.  Her anxiety was down and she had good cognitive and mental status.  The patient's mood was better and she reports that she has been sleeping better although recently she has not been doing all of the sleep hygiene that she had done to affect improved sleep patterns.  Psychosocial stressors have been a little bit better with her husband returning to work in his office during the day which gives her a break from being around him 24/7.  Her husband continues to consume significant amounts of alcohol which limit their healthy interactions.  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  The patient reports that things have continued to be very similar at home although her husband has gone back to work.  The patient also house that for a friend for a weekend that also went very well.  The patient reports that she has had times where her anxiety seems to exacerbate but overall she continues to do better.  04/26/2021: The patient reports that her uncle is moved into the office structure that they built behind their house that was originally built to house her husband's home office that was not being used.  She reports that initially her husband was resistant to this but decided it was okay.  She reports that overall this is turned out well and it is helped motivate her in some aspects.  The patient continues to struggle with completing task but was able to help get the structure ready for her uncle.  He has not overextended himself into their day-to-day lives and it does appear to be going fairly well.  The  patient reports that her pain continues as well as stress of depression but she has been  having fewer panic attacks.  Diagnosis:   Cognitive and neurobehavioral dysfunction  Chronic fatigue  Sleep disturbance  Panic disorder with agoraphobia  Generalized anxiety disorder  Attention deficit hyperactivity disorder (ADHD), predominantly inattentive type    Ilean Skill, Psy.D. Clinical Psychologist Neuropsychologist

## 2021-07-18 ENCOUNTER — Encounter: Payer: Managed Care, Other (non HMO) | Admitting: Psychology

## 2021-08-30 ENCOUNTER — Other Ambulatory Visit: Payer: Self-pay | Admitting: General Surgery

## 2021-08-30 DIAGNOSIS — E041 Nontoxic single thyroid nodule: Secondary | ICD-10-CM

## 2021-08-30 DIAGNOSIS — Z1231 Encounter for screening mammogram for malignant neoplasm of breast: Secondary | ICD-10-CM

## 2021-09-12 ENCOUNTER — Other Ambulatory Visit: Payer: Self-pay

## 2021-09-12 ENCOUNTER — Encounter: Payer: Managed Care, Other (non HMO) | Attending: Psychology | Admitting: Psychology

## 2021-09-12 DIAGNOSIS — F4001 Agoraphobia with panic disorder: Secondary | ICD-10-CM | POA: Diagnosis not present

## 2021-09-12 DIAGNOSIS — F09 Unspecified mental disorder due to known physiological condition: Secondary | ICD-10-CM | POA: Insufficient documentation

## 2021-09-12 DIAGNOSIS — F900 Attention-deficit hyperactivity disorder, predominantly inattentive type: Secondary | ICD-10-CM | POA: Diagnosis not present

## 2021-09-12 DIAGNOSIS — R5382 Chronic fatigue, unspecified: Secondary | ICD-10-CM

## 2021-09-12 DIAGNOSIS — G479 Sleep disorder, unspecified: Secondary | ICD-10-CM | POA: Insufficient documentation

## 2021-09-18 ENCOUNTER — Encounter: Payer: Self-pay | Admitting: Psychology

## 2021-09-18 NOTE — Progress Notes (Signed)
Neuropsychology Visit  Patient:  Susan Soto   DOB: [birth year]  MR Number: [id number]  Location: [facility] [place], [place] [id number] [place] [id number] Dept: [phone]  Date of Service: 09/11/2021  Start: 11 AM End: 12 PM  Duration of Service: 1 Hour  Today's visit was a 1 hour in person visit that was conducted in my outpatient clinic office with the patient myself present.  Provider/Observer:     Edgardo Roys PsyD  Chief Complaint:      Chief Complaint  Patient presents with   Anxiety   Depression   Stress   Sleeping Problem    Reason For Service:     Llana Deshazo is a 55 year old female referred by Daymon Larsen, MD with [facility].  The patient has a history of sleep disturbance with reports of cognitive disturbance and bilateral carpal tunnel syndrome.  The patient has been out of work after taking a medical leave in 2017 but then returning to work in a different job.  The patient has a history of major depressive disorder, adult residual attention deficit disorder and potentially residual cognitive difficulties after being diagnosed and treated for breast cancer in 2009 with both chemotherapy and radiation.  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   Patient reports that she was diagnosed with inattentive type attention deficit disorder in her early 45s and always had significant weakness keeping up with paperwork and documentation with her job.  The patient works as a Education officer, [employer].  The patient reports that she also had significant issues with major depressive disorder.  She reports that she took a medical leave of absence in 2017 after developing significant exacerbation of her major  depression with her mother passing away.  The patient returned to work after couple of months with an opportunity for a different job that had less demands and lower caseload.  Patient reports that she continued to have difficulty keeping up with any kind of paperwork and her previous strategies for dealing with that were not very helpful.  The patient reports that if she is not taking any medications that she just stays in bed, has no energy and significant difficulty with motivation.  The patient reports that this was true even when she was a young child.   The patient reports that she had an exacerbation of her difficulties after her chemotherapy treatment for breast cancer.  The patient reports that initially she experienced "chemo brain" but got better and then in 2017 her symptoms returned and she deteriorated.  The patient reports that typically she would have times of "hyperfocus" to get caught up now she cannot "hyperfocus" and has difficulty keeping up with paperwork.  The patient reports that she made improvements while out on FMLA after her mother and close aunt died.   The patient reports that she has seen a psychiatrist for 20 years in [place].   Patient reports being extremely stressed at work and feeling like she is extremely behind on her caseload and paperwork.  Her job has allowed her to start coming in at 10 AM to help her get better sleep.  Even with this adjustment the patient has difficulty getting into work before 11 AM.  The patient reports that she cant get her thoughts focused enough  to do anything even at home.  She is trying to work long enough to retire early.    The patient is not sleeping well but no significant findings on sleep study beyond delayed REM onset.  The patient had been on extreme low fat diet for many years but has stopped this diet.     The patient now has sudden onset of anxiety and panic attacks.     03/28/20 Update:  The patient returned for visit and  brought the paperwork that she had forgotten in Feb.  The patient described a a decline in memory and executive functioning along with increasing anxiety and worsening of ADD/attention symptoms.  She describes new onset panic attacks within the past 6 months.  "pins and needles and pain in fingers described".  She describes a decline in her ability to perform tasks that involve concentration.  She continues to use Dexedrine for attention along with valium 1-2 times per day.  The patient had been out on FMLA due to major depresion and new onset of anxiety.  FMLA has run out and the patient has had to stop working but her short-term disability has denied her so far, attributing it to a pre-existing condition according to patient.  The patient describes severe insomnia (not OSA) but has had abnormalities on sleep study related to reduction in REM sleep.    04/11/2020 update: The patient return for a visit today and reports that she has been having some improvements in her sleep pattern and there has been some improvements in her fatigue during the day.  She has reduced how much stimulants she is taking as well as taking less Valium.  The patient reports that she continues to have significant difficulties getting going in the morning but she has been going to bed earlier at night and getting up in the morning sooner and on many days she is able to get up at sunrise.  She has been following sleep hygiene efforts.  05/26/2020: The patient reports that she is continued to have some improvements but continues with significant attentional issues and disturbed sleep.  The patient is continued with the lower dose of psychostimulant medications but this has a negative impact on her attention and concentration but does seem to help with her anxiety symptoms.  The patient has continued to work on sleep hygiene issues and has improved somewhat and will be following up with her neurologist soon.  06/20/2020: The patient reports  that she has continued to make improvements particular around her sleep functioning.  The patient reports that she went to the beach to spend some time as well as visit her friend and was away from her husband another household stressors.  She actively worked on sleep hygiene issues and focused on very fundamental behavioral consistencies.  She reports that she did much better as far as her anxiety and stress.  There continues to be a lot of frustration and stressors associated with her husband's activities and behaviors and the patient is working on more focus on taking care of herself rather than trying to fix all intervene with his issues that he himself is not acknowledging or actively trying to address.  09/08/2020: The patient missed her last appointment as she had documented the time in a confusing way.  It was an 8:00 appointment and she typically has great difficulty getting herself organized to these appointments and miss documented in her calendar and was going to be too late for the appointment before she  realized this discrepancy.  The patient reports that in someway she is done better and has been actively working on a number of things and has made more accomplishments in this period of time then she typically gives herself credit for.  The patient is done very well working on foundational issues and has been improving her behavioral aspects of sleep hygiene, paying close attention all to good nutrition and physical action and activity.  The patient reports that she continues to have difficulty maintaining attention and focus and there have been efforts to adjust some of her medications for optimization.  The patient reports that there continue to be significant stressors with her husband and her husband's psychiatric issues of severe OCD including hoarding type behaviors.  10/06/2020: The patient reports that she has continued to have significant issues of cognitive difficulties and fatigue during  the day.  Patient continues to have sleep disturbance and we talked about some very practical issues around sleep hygiene including sleeping in her room without pets that she reports will wake her up at various times at night.  Patient continues to have significant pain continued severe sleep disturbance but has improved sleep hygiene and sleep pattern significantly since initially being here.  11/10/2020: The patient reports that she has been off track with regard to her sleep hygiene regimen and has noticed a worsening of her cognitive difficulties including attentional issues, anxiety stress, executive function etc. when she does not follow her sleep hygiene regimen is closely as she had been.  The patient reports that there have been medical complications recently with both her and her husband developing symptomatic COVID-19 infection with the patient having more than a week of illness.  During the time her husband was sick he did completely stop drinking which she was hopeful would lead to his continued cessation of alcohol use.  However, as soon as he felt well enough to go back to longstanding habits he did return to his alcohol use and consumption but initially restricted it to drinking beer but is now returned to his previous multi sources of alcohol use.  The patient reports that she has gotten better about being stressed over the impacts of his alcohol abuse realizing that there is little to nothing that she can do to manage or control his activities.  The patient is focused on her wellbeing overall and is learned a number of viable coping skills and implemented those to help her symptoms.  However, she continues to have significant attentional deficits and other cognitive deficits.  01/05/2021: The patient appeared to be in some of the best status she has been since I started seeing her.  Her anxiety was down and she had good cognitive and mental status.  The patient's mood was better and she reports  that she has been sleeping better although recently she has not been doing all of the sleep hygiene that she had done to affect improved sleep patterns.  Psychosocial stressors have been a little bit better with her husband returning to work in his office during the day which gives her a break from being around him 24/7.  Her husband continues to consume significant amounts of alcohol which limit their healthy interactions.  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  The patient reports that things have continued to be very similar at home although her husband has gone back to work.  The patient also house that for a friend for a weekend that also went very well.  The patient reports that she has had times where her anxiety seems to exacerbate but overall she continues to do better.  04/26/2021: The patient reports that her uncle is moved into the office structure that they built behind their house that was originally built to house her husband's home office that was not being used.  She reports that initially her husband was resistant to this but decided it was okay.  She reports that overall this is turned out well and it is helped motivate her in some aspects.  The patient continues to struggle with completing task but was able to help get the structure ready for her uncle.  He has not overextended himself into their day-to-day lives and it does appear to be going fairly well.  The patient reports that her pain continues as well as stress of depression but she has been having fewer panic attacks.  09/11/2021: The patient reports that things have been changing around her house significantly lately.  The patient reports that her and her husband have  decided to look at selling their house and they have had to be doing a great deal of things around the house or getting ready to to prepare it for sale.  The plan is to move into a smaller home but her husband's compulsive buying and hoarding of types of clothes etc. are having to be changed and fixed.  There is plan to do some remodeling in the house which are all can fall on her shoulders.  However, she is looking forward to making some significant changes which will help her overall.  We continue to work on therapeutic interventions around her cognitive issues and depression and anxiety.  Treatment Interventions:  Cognitive Behavioral and coping strategies along with continued history.  Participation Level:   Active  Participation Quality:  Appropriate and Redirectable      Behavioral Observation:  Well Groomed, Alert, and Appropriate.   Current Psychosocial Factors: The patient continues to struggle with basic status around the house and while they have improved and she has completed many more tasks than she had been she continues to have to work very hard to not get sidetracked in the middle of various tasks.  Content of Session:   Reviewed current symptoms and worked on coping and adjustment issues.    Effectiveness of Interventions: Patient is very motivated to improve and working hard on some of the challenging behavioral changes we have done around sleep hygiene.    Target Goals:   Improve sleep, work on anxiety and depression along with cognitive deficits.  Goals Last Reviewed:   09/11/2021  Goals Addressed Today:    Continued with sleep hygiene issues and concerns around cognitive deficits and coping.  Impression/Diagnosis:   Lunabelle Oatley is a 55 year old female referred by Daymon Larsen, MD with [facility].  The patient has a history of sleep disturbance with reports of cognitive disturbance and bilateral carpal tunnel syndrome.  The patient has been out of work after  taking a medical leave in 2017 but then returning to work in a different job.  The patient has a history of major depressive disorder, adult residual attention deficit disorder and potentially residual cognitive difficulties  after being diagnosed and treated for breast cancer in 2009 with both chemotherapy and radiation.  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  The patient continues with significant anxiety, pain and sleep disturbance.  The patient has been actively working on therapeutic interventions today we worked on some very practical strategies for aiding her free recall of information and strategies for completing tasks.  10/06/2020: The patient reports that she has continued to have significant issues of cognitive difficulties and fatigue during the day.  Patient continues to have sleep disturbance and we talked about some very practical issues around sleep hygiene including sleeping in her room without pets that she reports will wake her up at various times at night.  Patient continues to have significant pain continued severe sleep disturbance but has improved sleep hygiene and sleep pattern significantly since initially being here.  11/10/2020: The patient reports that she has been off track with regard to her sleep hygiene regimen and has noticed a worsening of her cognitive difficulties including attentional issues, anxiety stress, executive function etc. when she does not follow her sleep hygiene regimen is closely as she had been.  The patient reports that there have been medical complications recently with both her and her husband developing symptomatic COVID-19 infection with the patient having more than a week of illness.  During the time her husband was sick he did completely stop drinking which she was hopeful would lead to his continued  cessation of alcohol use.  However, as soon as he felt well enough to go back to longstanding habits he did return to his alcohol use and consumption but initially restricted it to drinking beer but is now returned to his previous multi sources of alcohol use.  The patient reports that she has gotten better about being stressed over the impacts of his alcohol abuse realizing that there is little to nothing that she can do to manage or control his activities.  The patient is focused on her wellbeing overall and is learned a number of viable coping skills and implemented those to help her symptoms.  However, she continues to have significant attentional deficits and other cognitive deficits.  01/05/2021: The patient appeared to be in some of the best status she has been since I started seeing her.  Her anxiety was down and she had good cognitive and mental status.  The patient's mood was better and she reports that she has been sleeping better although recently she has not been doing all of the sleep hygiene that she had done to affect improved sleep patterns.  Psychosocial stressors have been a little bit better with her husband returning to work in his office during the day which gives her a break from being around him 24/7.  Her husband continues to consume significant amounts of alcohol which limit their healthy interactions.  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  The patient reports that things have continued to be very similar at home although her husband has gone back to work.  The patient also house that for a friend for a  weekend that also went very well.  The patient reports that she has had times where her anxiety seems to exacerbate but  overall she continues to do better.  04/26/2021: The patient reports that her uncle is moved into the office structure that they built behind their house that was originally built to house her husband's home office that was not being used.  She reports that initially her husband was resistant to this but decided it was okay.  She reports that overall this is turned out well and it is helped motivate her in some aspects.  The patient continues to struggle with completing task but was able to help get the structure ready for her uncle.  He has not overextended himself into their day-to-day lives and it does appear to be going fairly well.  The patient reports that her pain continues as well as stress of depression but she has been having fewer panic attacks.  09/11/2021: The patient reports that things have been changing around her house significantly lately.  The patient reports that her and her husband have decided to look at selling their house and they have had to be doing a great deal of things around the house or getting ready to to prepare it for sale.  The plan is to move into a smaller home but her husband's compulsive buying and hoarding of types of clothes etc. are having to be changed and fixed.  There is plan to do some remodeling in the house which are all can fall on her shoulders.  However, she is looking forward to making some significant changes which will help her overall.  We continue to work on therapeutic interventions around her cognitive issues and depression and anxiety.  Diagnosis:   Cognitive and neurobehavioral dysfunction  Chronic fatigue  Sleep disturbance  Panic disorder with agoraphobia    Ilean Skill, Psy.D. Clinical Psychologist Neuropsychologist

## 2021-09-25 ENCOUNTER — Ambulatory Visit
Admission: RE | Admit: 2021-09-25 | Discharge: 2021-09-25 | Disposition: A | Discharge status: Home / Self Care | Payer: Managed Care, Other (non HMO) | Source: Ambulatory Visit | Attending: General Surgery | Admitting: General Surgery

## 2021-09-25 ENCOUNTER — Other Ambulatory Visit: Payer: Self-pay

## 2021-09-25 DIAGNOSIS — E041 Nontoxic single thyroid nodule: Secondary | ICD-10-CM

## 2021-09-25 DIAGNOSIS — Z1231 Encounter for screening mammogram for malignant neoplasm of breast: Secondary | ICD-10-CM | POA: Insufficient documentation

## 2021-10-09 ENCOUNTER — Encounter: Payer: Managed Care, Other (non HMO) | Admitting: Psychology

## 2021-11-15 ENCOUNTER — Ambulatory Visit: Payer: Managed Care, Other (non HMO) | Admitting: Family Medicine

## 2021-11-17 ENCOUNTER — Ambulatory Visit: Payer: Managed Care, Other (non HMO) | Admitting: Family Medicine

## 2021-11-18 ENCOUNTER — Other Ambulatory Visit: Payer: Self-pay | Admitting: Family Medicine

## 2021-11-20 ENCOUNTER — Encounter: Payer: Self-pay | Admitting: Family Medicine

## 2021-11-20 ENCOUNTER — Ambulatory Visit (INDEPENDENT_AMBULATORY_CARE_PROVIDER_SITE_OTHER): Payer: Managed Care, Other (non HMO) | Admitting: Family Medicine

## 2021-11-20 ENCOUNTER — Other Ambulatory Visit: Payer: Self-pay

## 2021-11-20 VITALS — BP 132/86 | HR 71 | Temp 97.1°F | Ht 65.0 in | Wt 177.1 lb

## 2021-11-20 DIAGNOSIS — F900 Attention-deficit hyperactivity disorder, predominantly inattentive type: Secondary | ICD-10-CM

## 2021-11-20 DIAGNOSIS — F410 Panic disorder [episodic paroxysmal anxiety] without agoraphobia: Secondary | ICD-10-CM | POA: Diagnosis not present

## 2021-11-20 DIAGNOSIS — E038 Other specified hypothyroidism: Secondary | ICD-10-CM

## 2021-11-20 DIAGNOSIS — R5382 Chronic fatigue, unspecified: Secondary | ICD-10-CM

## 2021-11-20 DIAGNOSIS — E559 Vitamin D deficiency, unspecified: Secondary | ICD-10-CM

## 2021-11-20 DIAGNOSIS — F09 Unspecified mental disorder due to known physiological condition: Secondary | ICD-10-CM

## 2021-11-20 DIAGNOSIS — Z853 Personal history of malignant neoplasm of breast: Secondary | ICD-10-CM

## 2021-11-20 DIAGNOSIS — E041 Nontoxic single thyroid nodule: Secondary | ICD-10-CM

## 2021-11-20 MED ORDER — LEVOTHYROXINE SODIUM 50 MCG PO TABS: 50.0000 ug | ORAL_TABLET | Freq: Every day | ORAL | 3 refills | Status: DC

## 2021-11-20 MED ORDER — VALACYCLOVIR HCL 1 G PO TABS: 2000.0000 mg | ORAL_TABLET | Freq: Every day | ORAL | 1 refills | Status: DC | PRN

## 2021-11-20 NOTE — Patient Instructions (Signed)
Labs today Medicines refilled today  Return as needed or when able for physical.

## 2021-11-20 NOTE — Progress Notes (Addendum)
Patient ID: Susan Soto, female    DOB: May 20, 1966, 56 y.o.   MRN: [id number]  This visit was conducted in person.  BP 132/86    Pulse 71    Temp (!) 97.1 F (36.2 C) (Temporal)    Ht 5' 5"  (1.651 m)    Wt 177 lb 1 oz (80.3 kg)    LMP 03/04/2014    SpO2 98%    BMI 29.46 kg/m    CC: f/u labs Subjective:   HPI: Susan Soto is a 56 y.o. female presenting on 11/20/2021 for Hypothyroidism (Here for f/u. ) and Form Completion (Provided Continued Disability form, from [facility], to be completed. )   Last seen 09/2020. Requests continued disability forms filled out today for life insurance policy. This has previously always been filled out by her psychiatrist for depression and anxiety. Her psychiatrist Dr Louretta Shorten in interim suddenly passed away. New psychiatrist is Dr Tyler Deis in [place].   On low dose dexedrine + low dose focalin for ADHD  Sees psychiatrist (previously Dr Wallis Mart, now Dr Tyler Deis) and Dr Sima Matas neuropsychologist for cognitive and neurobehavioral dysfunction, circadian rhythm sleep disorder, chronic fatigue, hypothyroidism, MDD, GAD with panic disorder, and ADHD.   Notes worsening fatigue as well as ongoing mental fog, new psychiatrist has been changing ADHD medications. She had also stopped vitamin D as well as magnesium and other supplements. Just restarted vit D 50k (takes twice weekly).  Requests thyroid levels and lamictal monitoring labs checked.   Occupation - Education officer, [employer] - FMLA started ~04/2019, she went into early retirement 11/2019 due to inability to work from mental disability.      Relevant past medical, surgical, family and social history reviewed and updated as indicated. Interim medical history since our last visit reviewed. Allergies and medications reviewed and updated. Outpatient Medications Prior to Visit  Medication Sig Dispense Refill   Ascorbic Acid (VITAMIN C) POWD Take 1,000 mg by mouth daily.     ASHWAGANDHA PO Take 375 mg by  mouth at bedtime.     B Complex Vitamins (VITAMIN B COMPLEX PO) Take by mouth daily.     COLLAGEN PO Take 1 tablet by mouth daily.     diazepam (VALIUM) 5 MG tablet Take 5 mg by mouth at bedtime.     lamoTRIgine 50 MG TBDP Take 50 mg by mouth daily. Takes 2 tablets once daily     medium chain triglycerides (MCT OIL) oil Take by mouth 3 (three) times daily. Takes 3 TBSP daily     Melatonin 5 MG CAPS Take 2 capsules (10 mg total) by mouth at bedtime.  0   Multiple Vitamins-Minerals (ANTIOXIDANT FORMULA SG) capsule Take 1 capsule by mouth daily. Powder daily     NON FORMULARY Vitamins ADEK     Omega-3 Fatty Acids (OMEGA 3 PO) Take 690 mg by mouth daily. Nordic naturals     OVER THE COUNTER MEDICATION 2 capsules daily. KETO- metabolic ketosis support     Probiotic Product (PROBIOTIC DAILY PO) Take by mouth daily.     Cholecalciferol (VITAMIN D-3) 5000 UNITS TABS Take 2 tablets by mouth daily.      dexmethylphenidate (FOCALIN) 10 MG tablet Take 15 mg by mouth daily.     dextroamphetamine (DEXTROSTAT) 5 MG tablet Take 5 mg by mouth 2 (two) times daily.     levothyroxine (SYNTHROID) 50 MCG tablet Take 1 tablet (50 mcg total) by mouth daily. 90 tablet 3   valACYclovir (VALTREX) 1000  MG tablet Take 2 tablets (2,000 mg total) by mouth daily as needed. As needed for fever blisters 20 tablet 1   Magnesium 250 MG TABS Take 1 tablet (250 mg total) by mouth daily.     TURMERIC PO Take 1 tablet by mouth daily.     dextroamphetamine (DEXEDRINE SPANSULE) 15 MG 24 hr capsule Take 15 mg by mouth daily. Takes in the morning     No facility-administered medications prior to visit.     Per HPI unless specifically indicated in ROS section below Review of Systems  Objective:  BP 132/86    Pulse 71    Temp (!) 97.1 F (36.2 C) (Temporal)    Ht 5' 5"  (1.651 m)    Wt 177 lb 1 oz (80.3 kg)    LMP 03/04/2014    SpO2 98%    BMI 29.46 kg/m   Wt Readings from Last 3 Encounters:  12/14/21 174 lb 8 oz (79.2 kg)   11/20/21 177 lb 1 oz (80.3 kg)  09/26/20 177 lb 3 oz (80.4 kg)      Physical Exam Vitals and nursing note reviewed.  Constitutional:      Appearance: Normal appearance. She is not ill-appearing.  Eyes:     Extraocular Movements: Extraocular movements intact.     Pupils: Pupils are equal, round, and reactive to light.  Neck:     Thyroid: Thyroid mass present. No thyromegaly or thyroid tenderness.  Cardiovascular:     Rate and Rhythm: Normal rate and regular rhythm.     Pulses: Normal pulses.     Heart sounds: Normal heart sounds. No murmur heard. Pulmonary:     Effort: Pulmonary effort is normal. No respiratory distress.     Breath sounds: Normal breath sounds. No wheezing, rhonchi or rales.  Musculoskeletal:     Right lower leg: No edema.     Left lower leg: No edema.  Skin:    General: Skin is warm and dry.     Findings: No rash.  Neurological:     Mental Status: She is alert.  Psychiatric:        Mood and Affect: Mood normal.        Behavior: Behavior normal.      Results for orders placed or performed in visit on 11/20/21  CBC with Differential/Platelet  Result Value Ref Range   WBC 6.9 3.4 - 10.8 x10E3/uL   RBC 4.83 3.77 - 5.28 x10E6/uL   Hemoglobin 14.5 11.1 - 15.9 g/dL   Hematocrit 42.4 34.0 - 46.6 %   MCV 88 79 - 97 fL   MCH 30.0 26.6 - 33.0 pg   MCHC 34.2 31.5 - 35.7 g/dL   RDW 12.9 11.7 - 15.4 %   Platelets 286 150 - 450 x10E3/uL   Neutrophils 44 Not Estab. %   Lymphs 41 Not Estab. %   Monocytes 12 Not Estab. %   Eos 2 Not Estab. %   Basos 1 Not Estab. %   Neutrophils Absolute 3.1 1.4 - 7.0 x10E3/uL   Lymphocytes Absolute 2.8 0.7 - 3.1 x10E3/uL   Monocytes Absolute 0.8 0.1 - 0.9 x10E3/uL   EOS (ABSOLUTE) 0.1 0.0 - 0.4 x10E3/uL   Basophils Absolute 0.1 0.0 - 0.2 x10E3/uL   Immature Granulocytes 0 Not Estab. %   Immature Grans (Abs) 0.0 0.0 - 0.1 x10E3/uL  Comprehensive metabolic panel  Result Value Ref Range   Glucose 93 70 - 99 mg/dL   BUN 18 6 -  24  mg/dL   Creatinine, Ser 0.64 0.57 - 1.00 mg/dL   eGFR 104 >59 mL/min/1.73   BUN/Creatinine Ratio 28 (H) 9 - 23   Sodium 140 134 - 144 mmol/L   Potassium 4.7 3.5 - 5.2 mmol/L   Chloride 100 96 - 106 mmol/L   CO2 26 20 - 29 mmol/L   Calcium 10.0 8.7 - 10.2 mg/dL   Total Protein 7.1 6.0 - 8.5 g/dL   Albumin 4.9 3.8 - 4.9 g/dL   Globulin, Total 2.2 1.5 - 4.5 g/dL   Albumin/Globulin Ratio 2.2 1.2 - 2.2   Bilirubin Total 0.3 0.0 - 1.2 mg/dL   Alkaline Phosphatase 59 44 - 121 IU/L   AST 15 0 - 40 IU/L   ALT 15 0 - 32 IU/L  Magnesium  Result Value Ref Range   Magnesium 2.0 1.6 - 2.3 mg/dL  T3  Result Value Ref Range   T3, Total 87 71 - 180 ng/dL  T4, free  Result Value Ref Range   Free T4 1.05 0.82 - 1.77 ng/dL  TSH  Result Value Ref Range   TSH 1.970 0.450 - 4.500 uIU/mL  VITAMIN D 25 Hydroxy (Vit-D Deficiency, Fractures)  Result Value Ref Range   Vit D, 25-Hydroxy 74.1 30.0 - 100.0 ng/mL  Vitamin B12  Result Value Ref Range   Vitamin B-12 496 232 - 1,245 pg/mL    Assessment & Plan:  This visit occurred during the SARS-CoV-2 public health emergency.  Safety protocols were in place, including screening questions prior to the visit, additional usage of staff PPE, and extensive cleaning of exam room while observing appropriate contact time as indicated for disinfecting solutions.   Problem List Items Addressed This Visit     Subclinical hypothyroidism    Update TFTs on low dose levothyroxine       Relevant Medications   levothyroxine (SYNTHROID) 50 MCG tablet   Other Relevant Orders   T3 (Completed)   T4, free (Completed)   TSH (Completed)   MDD (major depressive disorder), recurrent episode, moderate ([place])    Sees psychiatry I will fill out forms she provides today for ongoing disability. This was previously filled out by her psychiatrist who has since passed away, she is establishing with new psychiatrist in interim.       Attention deficit hyperactivity disorder  (ADHD), predominantly inattentive type    Followed by psychiatrist on both dextroamphetamine and dexmethylphenidate.       Personal history of breast cancer    Recent reassuring evaluation by gen surgery who is now retiring. Per latest recommendation: Stable breast exam now 13 years post wide excision and whole breast radiation. Routine annual screening mammograms with her PCP is appropriate      Vitamin D deficiency    Update levels on 1000 IU daily replacement.       Relevant Orders   VITAMIN D 25 Hydroxy (Vit-D Deficiency, Fractures) (Completed)   Chronic fatigue    Update labs. Continues seeing psychiatry and neuropsychology.       Relevant Orders   CBC with Differential/Platelet (Completed)   Comprehensive metabolic panel (Completed)   Magnesium (Completed)   Vitamin B12 (Completed)   Thyroid nodule    Has been seeing general surgery with latest thyroid US again reassuring. Recommend rpt thyroid US 09/2023 and if normal, will compete 5 years of stability signifying benign cause.       Relevant Medications   levothyroxine (SYNTHROID) 50 MCG tablet   Cognitive and neurobehavioral dysfunction -  Primary    Chronic, ongoing condition, overall stable on current regimen followed by psychiatry and neuropsychologist.         Meds ordered this encounter  Medications   levothyroxine (SYNTHROID) 50 MCG tablet    Sig: Take 1 tablet (50 mcg total) by mouth daily.    Dispense:  90 tablet    Refill:  3   valACYclovir (VALTREX) 1000 MG tablet    Sig: Take 2 tablets (2,000 mg total) by mouth daily as needed (fever blisters).    Dispense:  20 tablet    Refill:  1   Orders Placed This Encounter  Procedures   CBC with Differential/Platelet   Comprehensive metabolic panel   Magnesium   T3   T4, free   TSH   VITAMIN D 25 Hydroxy (Vit-D Deficiency, Fractures)   Vitamin B12     Patient Instructions  Labs today Medicines refilled today  Return as needed or when able for  physical.   Follow up plan: No follow-ups on file.  Ria Bush, MD

## 2021-11-21 ENCOUNTER — Encounter: Payer: Managed Care, Other (non HMO) | Attending: Psychology | Admitting: Psychology

## 2021-11-21 ENCOUNTER — Other Ambulatory Visit: Payer: Self-pay

## 2021-11-21 DIAGNOSIS — F09 Unspecified mental disorder due to known physiological condition: Secondary | ICD-10-CM | POA: Diagnosis not present

## 2021-11-21 DIAGNOSIS — G479 Sleep disorder, unspecified: Secondary | ICD-10-CM | POA: Insufficient documentation

## 2021-11-21 DIAGNOSIS — F411 Generalized anxiety disorder: Secondary | ICD-10-CM | POA: Insufficient documentation

## 2021-11-21 DIAGNOSIS — F900 Attention-deficit hyperactivity disorder, predominantly inattentive type: Secondary | ICD-10-CM | POA: Diagnosis present

## 2021-11-21 DIAGNOSIS — F4001 Agoraphobia with panic disorder: Secondary | ICD-10-CM | POA: Insufficient documentation

## 2021-11-21 DIAGNOSIS — R5382 Chronic fatigue, unspecified: Secondary | ICD-10-CM | POA: Insufficient documentation

## 2021-11-21 LAB — COMPREHENSIVE METABOLIC PANEL
ALT: 15 IU/L (ref 0–32)
AST: 15 IU/L (ref 0–40)
Albumin/Globulin Ratio: 2.2 (ref 1.2–2.2)
Albumin: 4.9 g/dL (ref 3.8–4.9)
Alkaline Phosphatase: 59 IU/L (ref 44–121)
BUN/Creatinine Ratio: 28 — ABNORMAL HIGH (ref 9–23)
BUN: 18 mg/dL (ref 6–24)
Bilirubin Total: 0.3 mg/dL (ref 0.0–1.2)
CO2: 26 mmol/L (ref 20–29)
Calcium: 10 mg/dL (ref 8.7–10.2)
Chloride: 100 mmol/L (ref 96–106)
Creatinine, Ser: 0.64 mg/dL (ref 0.57–1.00)
Globulin, Total: 2.2 g/dL (ref 1.5–4.5)
Glucose: 93 mg/dL (ref 70–99)
Potassium: 4.7 mmol/L (ref 3.5–5.2)
Sodium: 140 mmol/L (ref 134–144)
Total Protein: 7.1 g/dL (ref 6.0–8.5)
eGFR: 104 mL/min/{1.73_m2} (ref >59)

## 2021-11-21 LAB — T3: T3, Total: 87 ng/dL (ref 71–180)

## 2021-11-21 LAB — CBC WITH DIFFERENTIAL/PLATELET
Basophils Absolute: 0.1 10*3/uL (ref 0.0–0.2)
Basos: 1 %
EOS (ABSOLUTE): 0.1 10*3/uL (ref 0.0–0.4)
Eos: 2 %
Hematocrit: 42.4 % (ref 34.0–46.6)
Hemoglobin: 14.5 g/dL (ref 11.1–15.9)
Immature Grans (Abs): 0 10*3/uL (ref 0.0–0.1)
Immature Granulocytes: 0 %
Lymphocytes Absolute: 2.8 10*3/uL (ref 0.7–3.1)
Lymphs: 41 %
MCH: 30 pg (ref 26.6–33.0)
MCHC: 34.2 g/dL (ref 31.5–35.7)
MCV: 88 fL (ref 79–97)
Monocytes Absolute: 0.8 10*3/uL (ref 0.1–0.9)
Monocytes: 12 %
Neutrophils Absolute: 3.1 10*3/uL (ref 1.4–7.0)
Neutrophils: 44 %
Platelets: 286 10*3/uL (ref 150–450)
RBC: 4.83 x10E6/uL (ref 3.77–5.28)
RDW: 12.9 % (ref 11.7–15.4)
WBC: 6.9 10*3/uL (ref 3.4–10.8)

## 2021-11-21 LAB — TSH: TSH: 1.97 u[IU]/mL (ref 0.450–4.500)

## 2021-11-21 LAB — MAGNESIUM: Magnesium: 2 mg/dL (ref 1.6–2.3)

## 2021-11-21 LAB — VITAMIN D 25 HYDROXY (VIT D DEFICIENCY, FRACTURES): Vit D, 25-Hydroxy: 74.1 ng/mL (ref 30.0–100.0)

## 2021-11-21 LAB — T4, FREE: Free T4: 1.05 ng/dL (ref 0.82–1.77)

## 2021-11-21 LAB — VITAMIN B12: Vitamin B-12: 496 pg/mL (ref 232–1245)

## 2021-11-22 ENCOUNTER — Telehealth: Payer: Self-pay

## 2021-11-22 NOTE — Assessment & Plan Note (Signed)
Update levels on 1000 IU daily replacement.  

## 2021-11-22 NOTE — Assessment & Plan Note (Addendum)
Recent reassuring evaluation by gen surgery who is now retiring. Per latest recommendation: Stable breast exam now 13 years post wide excision and whole breast radiation. Routine annual screening mammograms with her PCP is appropriate

## 2021-11-22 NOTE — Assessment & Plan Note (Addendum)
Chronic, ongoing condition, overall stable on current regimen followed by psychiatry and neuropsychologist.

## 2021-11-22 NOTE — Telephone Encounter (Signed)
Pt called back and said please fax in the form to the # on it & mail her a copy please.

## 2021-11-22 NOTE — Telephone Encounter (Signed)
Spoke with pt informing her there is no fax # on form.  Pt requests form be mailed in.  I mailed form to:  [place]  [id number]  Mailed copy of form to pt.

## 2021-11-22 NOTE — Telephone Encounter (Signed)
Pt provided Continued Disability form from [facility] at 11/20/21 OV to be completed.  Form has been completed.  Lvm asking pt to call back.  Need to know if she wants to pick up form from [place] or mail it to her.  [Form is in basket on [employer].  Made copy to scan.]

## 2021-11-22 NOTE — Assessment & Plan Note (Addendum)
Sees psychiatry I will fill out forms she provides today for ongoing disability. This was previously filled out by her psychiatrist who has since passed away, she is establishing with new psychiatrist in interim.

## 2021-11-22 NOTE — Assessment & Plan Note (Signed)
Update TFTs on low dose levothyroxine

## 2021-11-22 NOTE — Assessment & Plan Note (Addendum)
Has been seeing general surgery with latest thyroid US again reassuring. Recommend rpt thyroid US 09/2023 and if normal, will compete 5 years of stability signifying benign cause.

## 2021-11-22 NOTE — Assessment & Plan Note (Signed)
Update labs. Continues seeing psychiatry and neuropsychology.

## 2021-11-22 NOTE — Assessment & Plan Note (Signed)
Followed by psychiatrist on both dextroamphetamine and dexmethylphenidate.

## 2021-11-23 ENCOUNTER — Encounter: Payer: Self-pay | Admitting: Psychology

## 2021-11-23 NOTE — Progress Notes (Signed)
Neuropsychology Visit  Patient:  Susan Soto   DOB: [birth year]  MR Number: [id number]  Location: [facility] [place], [place] [id number] [place] [id number] Dept: [phone]  Date of Service: 11/21/2021  Start: 11 AM End: 12 PM  Duration of Service: 1 Hour  Today's visit was an in person visit that was conducted in my outpatient clinic office with the patient myself present.  Provider/Observer:     Edgardo Roys PsyD  Chief Complaint:      Chief Complaint  Patient presents with   Anxiety   Depression   Stress   Sleeping Problem   Other    Significant attentional deficits    Reason For Service:     K[place] Severs is a 56 year old female referred by Daymon Larsen, MD with [facility].  The patient has a history of sleep disturbance with reports of cognitive disturbance and bilateral carpal tunnel syndrome.  The patient has been out of work after taking a medical leave in 2017 but then returning to work in a different job.  The patient has a history of major depressive disorder, adult residual attention deficit disorder and potentially residual cognitive difficulties after being diagnosed and treated for breast cancer in 2009 with both chemotherapy and radiation.  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   Patient reports that she was diagnosed with inattentive type attention deficit disorder in her early 12s and always had significant weakness keeping up with paperwork and documentation with her job.  The patient works as a Education officer, [employer].  The patient reports that she also had significant issues with major depressive disorder.  She reports that she took a medical leave of absence in 2017 after developing  significant exacerbation of her major depression with her mother passing away.  The patient returned to work after couple of months with an opportunity for a different job that had less demands and lower caseload.  Patient reports that she continued to have difficulty keeping up with any kind of paperwork and her previous strategies for dealing with that were not very helpful.  The patient reports that if she is not taking any medications that she just stays in bed, has no energy and significant difficulty with motivation.  The patient reports that this was true even when she was a young child.   The patient reports that she had an exacerbation of her difficulties after her chemotherapy treatment for breast cancer.  The patient reports that initially she experienced "chemo brain" but got better and then in 2017 her symptoms returned and she deteriorated.  The patient reports that typically she would have times of "hyperfocus" to get caught up now she cannot "hyperfocus" and has difficulty keeping up with paperwork.  The patient reports that she made improvements while out on FMLA after her mother and close aunt died.   The patient reports that she has seen a psychiatrist for 20 years in [place].   Patient reports being extremely stressed at work and feeling like she is extremely behind on her caseload and paperwork.  Her job has allowed her to start coming in at 10 AM to help her get better sleep.  Even with this adjustment the patient has difficulty getting into work before 11 AM.  The patient reports that  she cant get her thoughts focused enough to do anything even at home.  She is trying to work long enough to retire early.    The patient is not sleeping well but no significant findings on sleep study beyond delayed REM onset.  The patient had been on extreme low fat diet for many years but has stopped this diet.     The patient now has sudden onset of anxiety and panic attacks.     03/28/20 Update:   The patient returned for visit and brought the paperwork that she had forgotten in Feb.  The patient described a a decline in memory and executive functioning along with increasing anxiety and worsening of ADD/attention symptoms.  She describes new onset panic attacks within the past 6 months.  "pins and needles and pain in fingers described".  She describes a decline in her ability to perform tasks that involve concentration.  She continues to use Dexedrine for attention along with valium 1-2 times per day.  The patient had been out on FMLA due to major depresion and new onset of anxiety.  FMLA has run out and the patient has had to stop working but her short-term disability has denied her so far, attributing it to a pre-existing condition according to patient.  The patient describes severe insomnia (not OSA) but has had abnormalities on sleep study related to reduction in REM sleep.    04/11/2020 update: The patient return for a visit today and reports that she has been having some improvements in her sleep pattern and there has been some improvements in her fatigue during the day.  She has reduced how much stimulants she is taking as well as taking less Valium.  The patient reports that she continues to have significant difficulties getting going in the morning but she has been going to bed earlier at night and getting up in the morning sooner and on many days she is able to get up at sunrise.  She has been following sleep hygiene efforts.  05/26/2020: The patient reports that she is continued to have some improvements but continues with significant attentional issues and disturbed sleep.  The patient is continued with the lower dose of psychostimulant medications but this has a negative impact on her attention and concentration but does seem to help with her anxiety symptoms.  The patient has continued to work on sleep hygiene issues and has improved somewhat and will be following up with her neurologist  soon.  06/20/2020: The patient reports that she has continued to make improvements particular around her sleep functioning.  The patient reports that she went to the beach to spend some time as well as visit her friend and was away from her husband another household stressors.  She actively worked on sleep hygiene issues and focused on very fundamental behavioral consistencies.  She reports that she did much better as far as her anxiety and stress.  There continues to be a lot of frustration and stressors associated with her husband's activities and behaviors and the patient is working on more focus on taking care of herself rather than trying to fix all intervene with his issues that he himself is not acknowledging or actively trying to address.  09/08/2020: The patient missed her last appointment as she had documented the time in a confusing way.  It was an 8:00 appointment and she typically has great difficulty getting herself organized to these appointments and miss documented in her calendar and was going to be  too late for the appointment before she realized this discrepancy.  The patient reports that in someway she is done better and has been actively working on a number of things and has made more accomplishments in this period of time then she typically gives herself credit for.  The patient is done very well working on foundational issues and has been improving her behavioral aspects of sleep hygiene, paying close attention all to good nutrition and physical action and activity.  The patient reports that she continues to have difficulty maintaining attention and focus and there have been efforts to adjust some of her medications for optimization.  The patient reports that there continue to be significant stressors with her husband and her husband's psychiatric issues of severe OCD including hoarding type behaviors.  10/06/2020: The patient reports that she has continued to have significant issues of  cognitive difficulties and fatigue during the day.  Patient continues to have sleep disturbance and we talked about some very practical issues around sleep hygiene including sleeping in her room without pets that she reports will wake her up at various times at night.  Patient continues to have significant pain continued severe sleep disturbance but has improved sleep hygiene and sleep pattern significantly since initially being here.  11/10/2020: The patient reports that she has been off track with regard to her sleep hygiene regimen and has noticed a worsening of her cognitive difficulties including attentional issues, anxiety stress, executive function etc. when she does not follow her sleep hygiene regimen is closely as she had been.  The patient reports that there have been medical complications recently with both her and her husband developing symptomatic COVID-19 infection with the patient having more than a week of illness.  During the time her husband was sick he did completely stop drinking which she was hopeful would lead to his continued cessation of alcohol use.  However, as soon as he felt well enough to go back to longstanding habits he did return to his alcohol use and consumption but initially restricted it to drinking beer but is now returned to his previous multi sources of alcohol use.  The patient reports that she has gotten better about being stressed over the impacts of his alcohol abuse realizing that there is little to nothing that she can do to manage or control his activities.  The patient is focused on her wellbeing overall and is learned a number of viable coping skills and implemented those to help her symptoms.  However, she continues to have significant attentional deficits and other cognitive deficits.  01/05/2021: The patient appeared to be in some of the best status she has been since I started seeing her.  Her anxiety was down and she had good cognitive and mental status.  The  patient's mood was better and she reports that she has been sleeping better although recently she has not been doing all of the sleep hygiene that she had done to affect improved sleep patterns.  Psychosocial stressors have been a little bit better with her husband returning to work in his office during the day which gives her a break from being around him 24/7.  Her husband continues to consume significant amounts of alcohol which limit their healthy interactions.  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  The patient reports that things have continued to be very similar at home although her husband has gone back to work.  The patient also house that for a friend for a weekend that also went very well.  The patient reports that she has had times where her anxiety seems to exacerbate but overall she continues to do better.  04/26/2021: The patient reports that her uncle is moved into the office structure that they built behind their house that was originally built to house her husband's home office that was not being used.  She reports that initially her husband was resistant to this but decided it was okay.  She reports that overall this is turned out well and it is helped motivate her in some aspects.  The patient continues to struggle with completing task but was able to help get the structure ready for her uncle.  He has not overextended himself into their day-to-day lives and it does appear to be going fairly well.  The patient reports that her pain continues as well as stress of depression but she has been having fewer panic attacks.  09/11/2021: The patient reports that things have been changing around her house significantly lately.  The  patient reports that her and her husband have decided to look at selling their house and they have had to be doing a great deal of things around the house or getting ready to to prepare it for sale.  The plan is to move into a smaller home but her husband's compulsive buying and hoarding of types of clothes etc. are having to be changed and fixed.  There is plan to do some remodeling in the house which are all can fall on her shoulders.  However, she is looking forward to making some significant changes which will help her overall.  We continue to work on therapeutic interventions around her cognitive issues and depression and anxiety.  11/21/2021: The patient reports that there have been some stressors between her and her husband recently that have been more than typical.  He continues to abuse alcohol but after this being specifically called out by their son who is now living with them he has made some attempts at reducing the amount of alcohol he is consuming.  His strategy has been to switch from distilled liquor to beer.  While this is produced some changes and reduced the level of his intoxication it is only primarily been done during the week and on weekends he goes back to consuming larger amounts of distilled alcohol.  The patient is very frustrated by his unwillingness to transfer money into her account so she could pay her insurance bills while at the same time continuing his compulsive buying habits and hoarding behaviors etc.  The patient is, however, actively working on trying to take care of herself first and feels like she is making some personal gains and doing better overall.  She is actively working on foundational issues such as diet, sleep and physical activity.  She continues to have attentional issues and other cognitive issues secondary to significant attention and concentration deficits.  Patient reports an improvement in her depression and anxiety symptoms.  Treatment  Interventions:  Cognitive Behavioral and coping strategies along with continued history.  Participation Level:   Active  Participation Quality:  Appropriate and Redirectable      Behavioral Observation:  Well Groomed, Alert,  and Appropriate.   Current Psychosocial Factors: The patient continues to struggle with basic status around the house and while they have improved and she has completed many more tasks than she had been she continues to have to work very hard to not get sidetracked in the middle of various tasks.  Content of Session:   Reviewed current symptoms and worked on coping and adjustment issues.    Effectiveness of Interventions: Patient is very motivated to improve and working hard on some of the challenging behavioral changes we have done around sleep hygiene.    Target Goals:   Improve sleep, work on anxiety and depression along with cognitive deficits.  Goals Last Reviewed:   11/21/2020  Goals Addressed Today:    Continued with sleep hygiene issues and concerns around cognitive deficits and coping.  Impression/Diagnosis:   Roselynne Lortz is a 56 year old female referred by Daymon Larsen, MD with [facility].  The patient has a history of sleep disturbance with reports of cognitive disturbance and bilateral carpal tunnel syndrome.  The patient has been out of work after taking a medical leave in 2017 but then returning to work in a different job.  The patient has a history of major depressive disorder, adult residual attention deficit disorder and potentially residual cognitive difficulties after being diagnosed and treated for breast cancer in 2009 with both chemotherapy and radiation.  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  The patient continues with significant anxiety, pain and sleep disturbance.  The patient has  been actively working on therapeutic interventions today we worked on some very practical strategies for aiding her free recall of information and strategies for completing tasks.  10/06/2020: The patient reports that she has continued to have significant issues of cognitive difficulties and fatigue during the day.  Patient continues to have sleep disturbance and we talked about some very practical issues around sleep hygiene including sleeping in her room without pets that she reports will wake her up at various times at night.  Patient continues to have significant pain continued severe sleep disturbance but has improved sleep hygiene and sleep pattern significantly since initially being here.  11/10/2020: The patient reports that she has been off track with regard to her sleep hygiene regimen and has noticed a worsening of her cognitive difficulties including attentional issues, anxiety stress, executive function etc. when she does not follow her sleep hygiene regimen is closely as she had been.  The patient reports that there have been medical complications recently with both her and her husband developing symptomatic COVID-19 infection with the patient having more than a week of illness.  During the time her husband was sick he did completely stop drinking which she was hopeful would lead to his continued cessation of alcohol use.  However, as soon as he felt well enough to go back to longstanding habits he did return to his alcohol use and consumption but initially restricted it to drinking beer but is now returned to his previous multi sources of alcohol use.  The patient reports that she has gotten better about being stressed over the impacts of his alcohol abuse realizing that there is little to nothing that she can do to manage or control his activities.  The patient is focused on her wellbeing overall and is learned a number of viable coping skills and implemented those to help her  symptoms.  However,  she continues to have significant attentional deficits and other cognitive deficits.  01/05/2021: The patient appeared to be in some of the best status she has been since I started seeing her.  Her anxiety was down and she had good cognitive and mental status.  The patient's mood was better and she reports that she has been sleeping better although recently she has not been doing all of the sleep hygiene that she had done to affect improved sleep patterns.  Psychosocial stressors have been a little bit better with her husband returning to work in his office during the day which gives her a break from being around him 24/7.  Her husband continues to consume significant amounts of alcohol which limit their healthy interactions.  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  The patient reports that things have continued to be very similar at home although her husband has gone back to work.  The patient also house that for a friend for a weekend that also went very well.  The patient reports that she has had times where her anxiety seems to exacerbate but overall she continues to do better.  04/26/2021: The patient reports that her uncle is moved into the office structure that they built behind their house that was originally built to house her husband's home office that was not being used.  She reports that initially her husband was resistant to this but decided it was okay.  She reports that overall this is turned out well and it is helped motivate her in some aspects.  The patient continues to struggle with completing task but was able to help get the structure ready for her uncle.  He has not overextended himself into their day-to-day lives and it does  appear to be going fairly well.  The patient reports that her pain continues as well as stress of depression but she has been having fewer panic attacks.  09/11/2021: The patient reports that things have been changing around her house significantly lately.  The patient reports that her and her husband have decided to look at selling their house and they have had to be doing a great deal of things around the house or getting ready to to prepare it for sale.  The plan is to move into a smaller home but her husband's compulsive buying and hoarding of types of clothes etc. are having to be changed and fixed.  There is plan to do some remodeling in the house which are all can fall on her shoulders.  However, she is looking forward to making some significant changes which will help her overall.  We continue to work on therapeutic interventions around her cognitive issues and depression and anxiety.  11/21/2021: The patient reports that there have been some stressors between her and her husband recently that have been more than typical.  He continues to abuse alcohol but after this being specifically called out by their son who is now living with them he has made some attempts at reducing the amount of alcohol he is consuming.  His strategy has been to switch from distilled liquor to beer.  While this is produced some changes and reduced  the level of his intoxication it is only primarily been done during the week and on weekends he goes back to consuming larger amounts of distilled alcohol.  The patient is very frustrated by his unwillingness to transfer money into her account so she could pay her insurance bills while at the same time continuing his compulsive buying habits and hoarding behaviors etc.  The patient is, however, actively working on trying to take care of herself first and feels like she is making some personal gains and doing better overall.  She is actively working on foundational issues such as diet,  sleep and physical activity.  She continues to have attentional issues and other cognitive issues secondary to significant attention and concentration deficits.  Patient reports an improvement in her depression and anxiety symptoms.  Diagnosis:   Cognitive and neurobehavioral dysfunction  Chronic fatigue  Sleep disturbance  Panic disorder with agoraphobia  Generalized anxiety disorder  Attention deficit hyperactivity disorder (ADHD), predominantly inattentive type    Ilean Skill, Psy.D. Clinical Psychologist Neuropsychologist

## 2021-11-25 ENCOUNTER — Encounter: Payer: Self-pay | Admitting: Family Medicine

## 2021-11-30 ENCOUNTER — Ambulatory Visit: Payer: Managed Care, Other (non HMO) | Admitting: Psychology

## 2021-12-09 IMAGING — MG MM DIGITAL SCREENING BILAT W/ TOMO AND CAD
8 series · 8 of 24 positions shown · non-contrast
Comparison: Previous exam(s).

CLINICAL DATA: Screening.

EXAM:
DIGITAL SCREENING BILATERAL MAMMOGRAM WITH TOMOSYNTHESIS AND CAD
TECHNIQUE: Bilateral screening digital craniocaudal and mediolateral oblique
mammograms were obtained. Bilateral screening digital breast
tomosynthesis was performed. The images were evaluated with
computer-aided detection.

[L CC synth-2D]
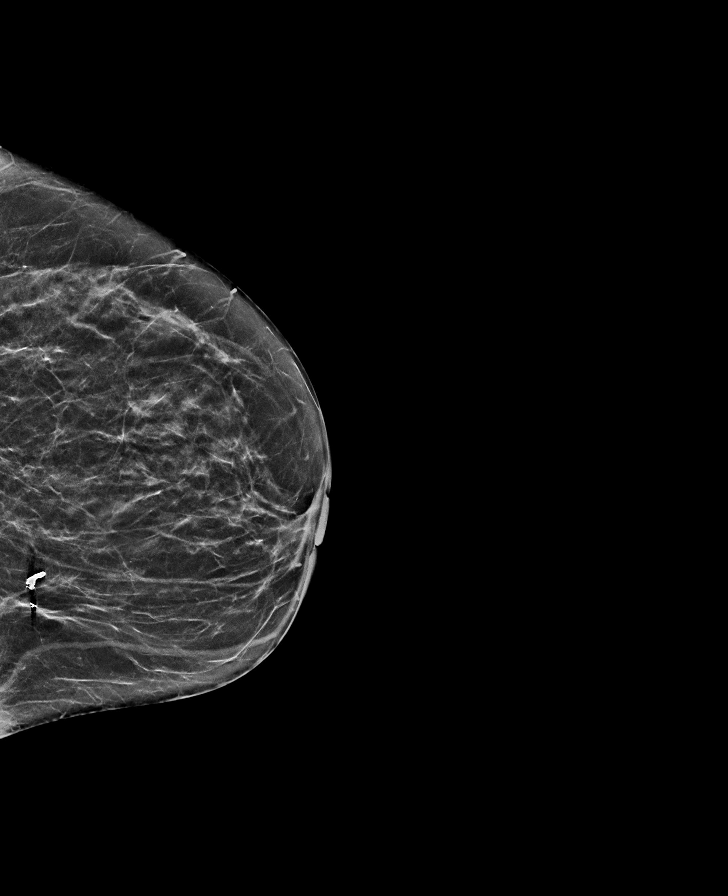

[L MLO synth-2D]
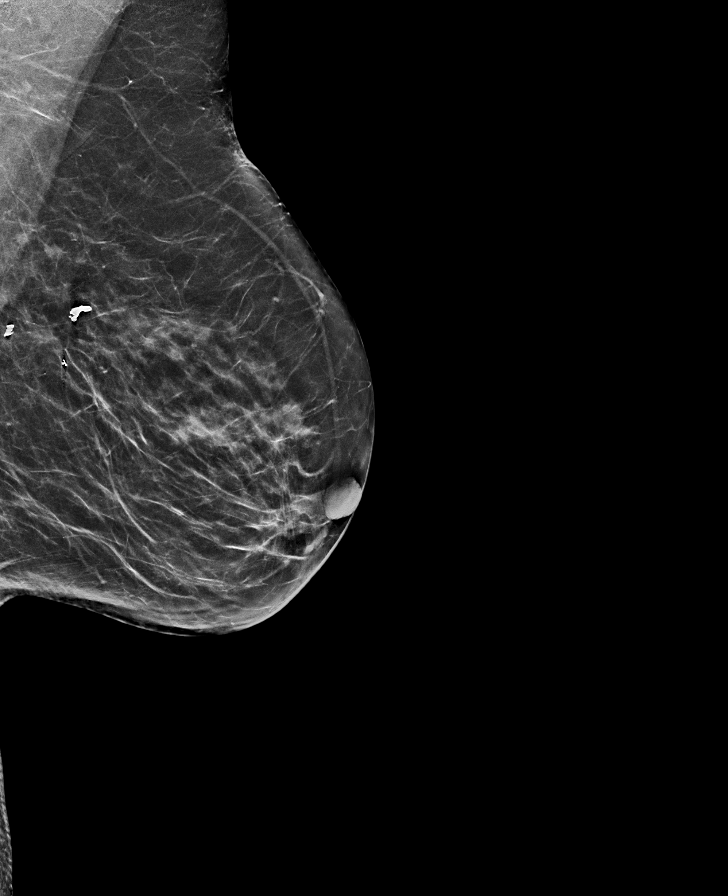

[R CC synth-2D]
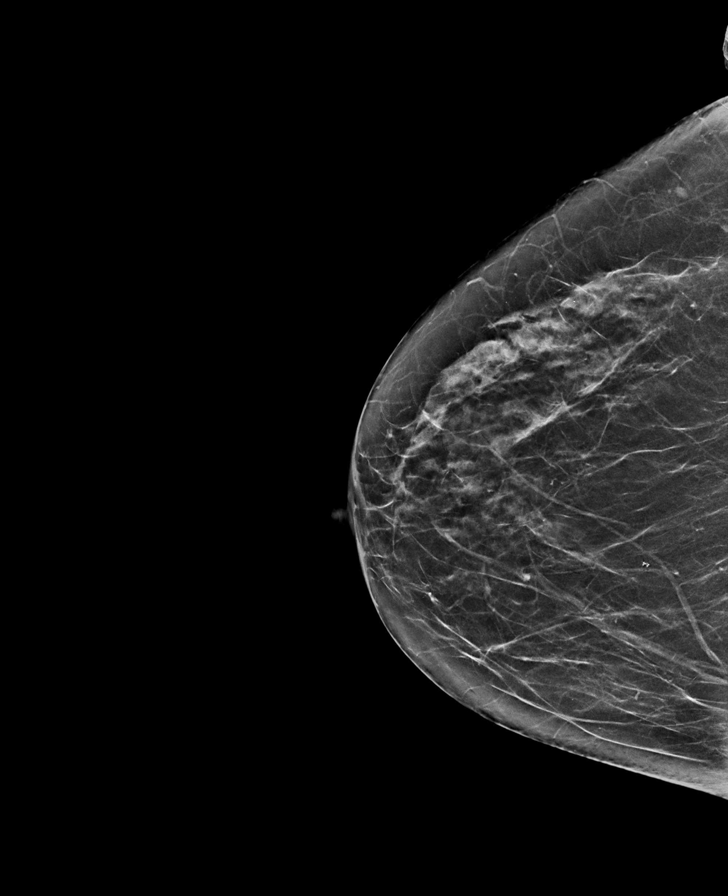

[R MLO synth-2D]
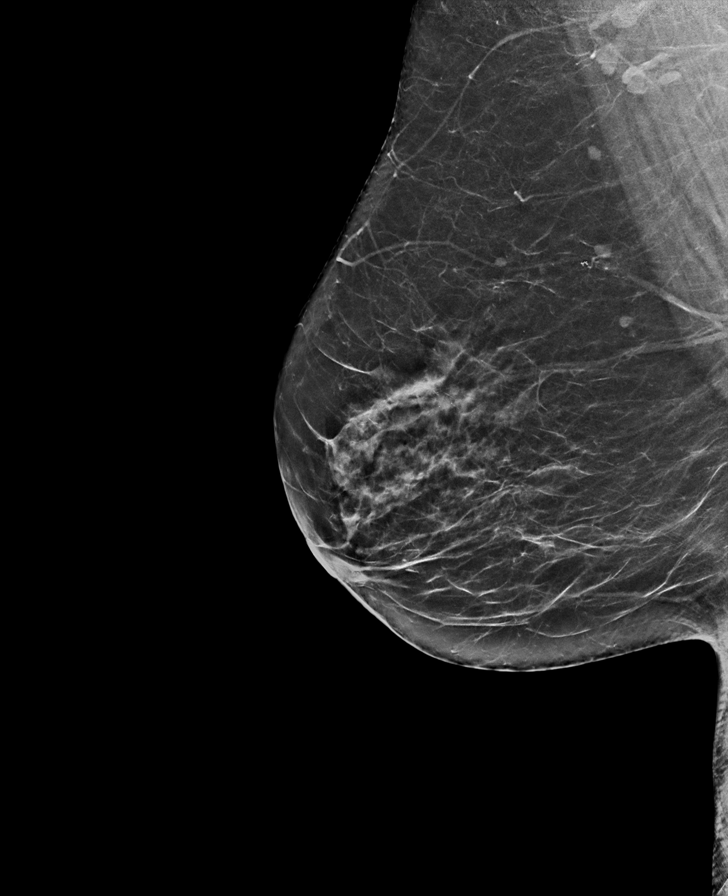

[L MLO tomo · tomo slice 33/65.0]
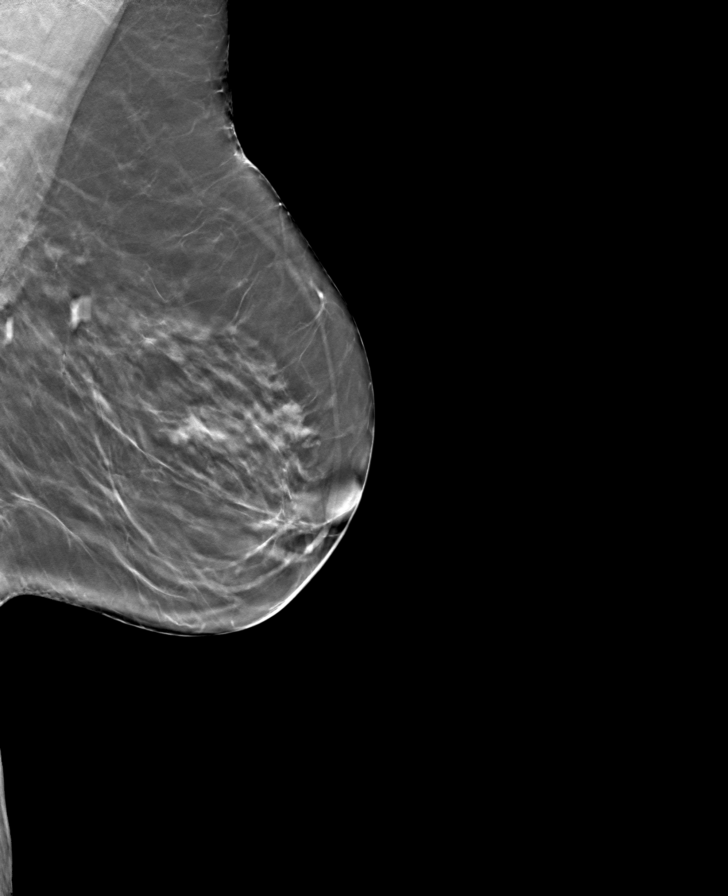

[R CC tomo · tomo slice 31/60.0]
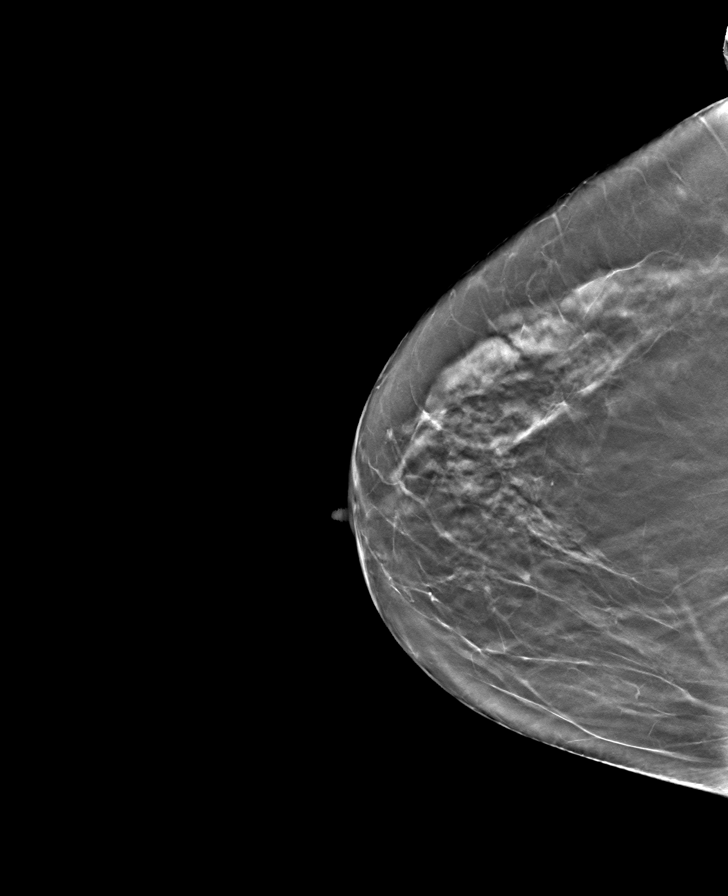

[L CC tomo · tomo slice 28/55.0]
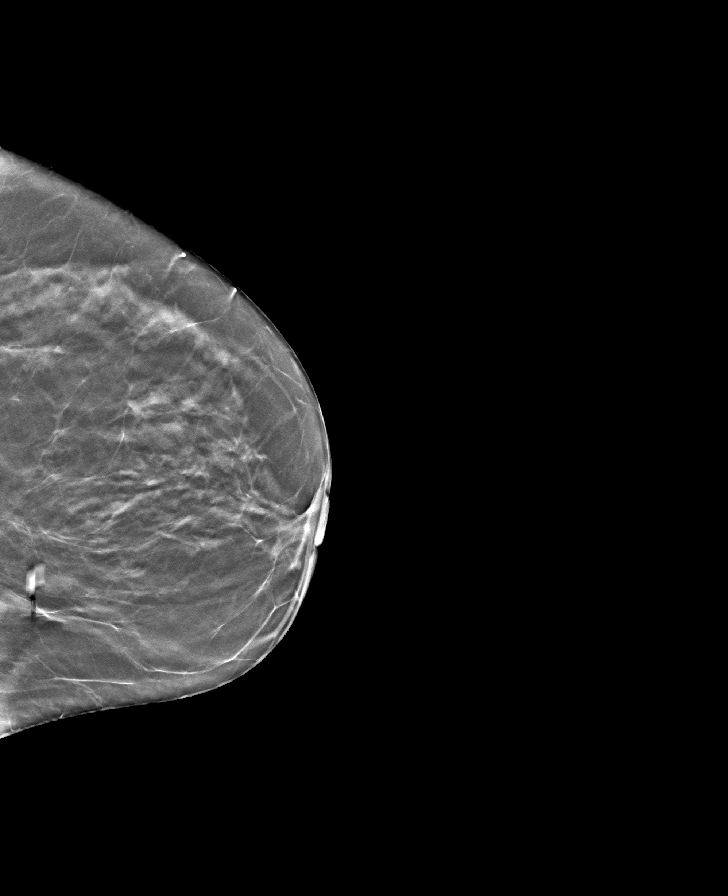

[R MLO tomo · tomo slice 35/68.0]
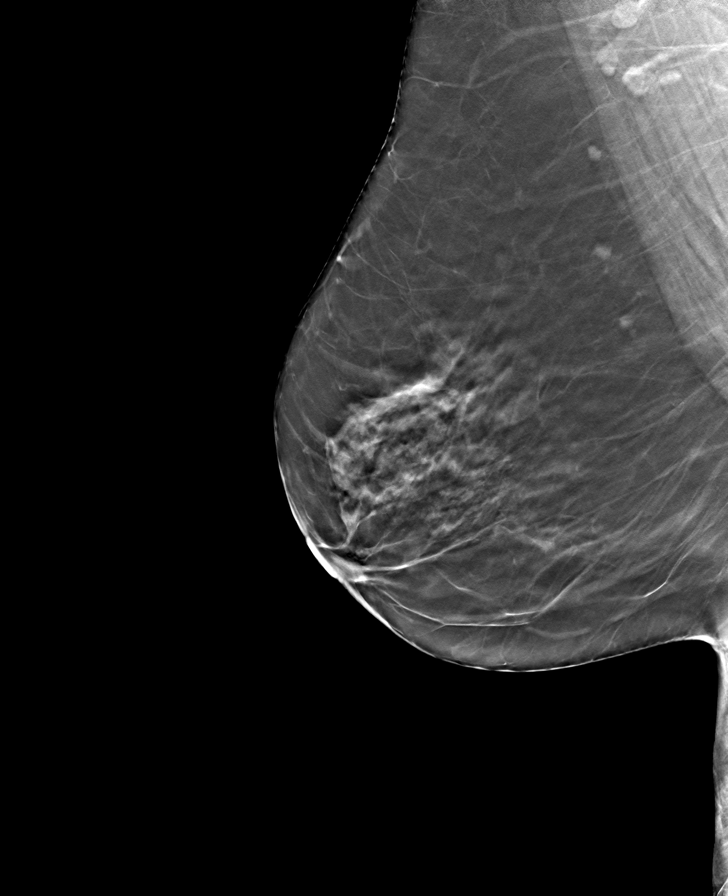

[8 of 24 positions shown; findings below may reference images not displayed]

ACR Breast Density Category c: The breast tissue is heterogeneously
dense, which may obscure small masses.
FINDINGS: There are no findings suspicious for malignancy.
IMPRESSION: No mammographic evidence of malignancy. A result letter of this
screening mammogram will be mailed directly to the patient.

RECOMMENDATION:
Screening mammogram in one year. (Code:[id number]-BC3)

BI-RADS CATEGORY  1: Negative.

## 2021-12-14 ENCOUNTER — Encounter: Payer: Self-pay | Admitting: Family Medicine

## 2021-12-14 ENCOUNTER — Other Ambulatory Visit: Payer: Self-pay

## 2021-12-14 ENCOUNTER — Ambulatory Visit (INDEPENDENT_AMBULATORY_CARE_PROVIDER_SITE_OTHER): Payer: Managed Care, Other (non HMO) | Admitting: Family Medicine

## 2021-12-14 VITALS — BP 126/80 | HR 100 | Temp 97.5°F | Ht 65.0 in | Wt 174.5 lb

## 2021-12-14 DIAGNOSIS — Z780 Asymptomatic menopausal state: Secondary | ICD-10-CM

## 2021-12-14 DIAGNOSIS — E559 Vitamin D deficiency, unspecified: Secondary | ICD-10-CM

## 2021-12-14 DIAGNOSIS — Z0000 Encounter for general adult medical examination without abnormal findings: Secondary | ICD-10-CM

## 2021-12-14 DIAGNOSIS — Z23 Encounter for immunization: Secondary | ICD-10-CM | POA: Diagnosis not present

## 2021-12-14 DIAGNOSIS — Z789 Other specified health status: Secondary | ICD-10-CM

## 2021-12-14 DIAGNOSIS — E038 Other specified hypothyroidism: Secondary | ICD-10-CM

## 2021-12-14 DIAGNOSIS — F410 Panic disorder [episodic paroxysmal anxiety] without agoraphobia: Secondary | ICD-10-CM

## 2021-12-14 DIAGNOSIS — E2839 Other primary ovarian failure: Secondary | ICD-10-CM

## 2021-12-14 DIAGNOSIS — Z853 Personal history of malignant neoplasm of breast: Secondary | ICD-10-CM

## 2021-12-14 DIAGNOSIS — F09 Unspecified mental disorder due to known physiological condition: Secondary | ICD-10-CM

## 2021-12-14 DIAGNOSIS — F900 Attention-deficit hyperactivity disorder, predominantly inattentive type: Secondary | ICD-10-CM

## 2021-12-14 MED ORDER — CHOLECALCIFEROL 100 MCG (4000 UT) PO CAPS: 1.0000 | ORAL_CAPSULE | Freq: Every day | ORAL | Status: AC

## 2021-12-14 NOTE — Progress Notes (Addendum)
Patient ID: Susan Soto, female    DOB: Jul 16, 1966, 56 y.o.   MRN: [id number]  This visit was conducted in person.  BP 126/80    Pulse 100    Temp (!) 97.5 F (36.4 C) (Temporal)    Ht 5\' 5"  (1.651 m)    Wt 174 lb 8 oz (79.2 kg)    LMP 03/04/2014    SpO2 99%    BMI 29.04 kg/m    CC: CPE Subjective:   HPI: Susan Soto is a 56 y.o. female presenting on 12/14/2021 for Annual Exam   Going to beach after today's visit.  Was told needed further disability forms filled out - she will bring me these.   Sees psychiatrist (previously Dr Wallis Mart, now Dr Tyler Deis) and Dr Sima Matas neuropsychologist for cognitive and neurobehavioral dysfunction, circadian rhythm sleep disorder, chronic fatigue, hypothyroidism, MDD, GAD with panic disorder and ADHD. Released from sleep doctor (Dohmeier).   Switched to [facility] 10/2021. Requests A1c and FLP checked as she's going to start exercise program that requires updated labs   Preventative: COLONOSCOPY WITH PROPOFOL 10/06/2019 - WNL, rpt 10 yrs [facility]) Well woman - mother dx with cervical cancer 40s. Pap smear normal 08/2018, 09/2020. Benign endometrial biopsy for post menopausal bleeding ~2018. Now checking Q2 yrs.  Mammogram 09/2021 Birads1 @ [facility]. H/o L breast cancer s/p radiation and chemotherapy 2009 - yearly mammograms, sees surgery yearly. Dr Bary Castilla is retiring. Per his latest note: "stable breast exam now 13 years post wide excision and whole breast radiation. Routine annual screening mammograms with PCP is appropriate." Does not complete breast exams at home - encouraged to do this.  LMP - 02/2014. Postmenopausal at age 58yo.  DEXA - will order given hypothyroidism and early menopause.  Lung cancer screening - not eligible  Flu yearly - declines this year COVID vaccine - discussed, declines  Td 2003, Tdap 2013, update today 11/2021 Shingrix - 09/2019, 11/2019  Seat belt use discussed.  Sunscreen use discussed.  No changing  moles.  Non smoker. Significant 2nd hand smoke exposure.  Alcohol - none.  Dentist q6 mo  Eye exam - yearly - due  Caffeine: 1 cup coffee in am  Lives with husband and 1 son, daughter in college, 1 dog and 1 cat, 5-6 outside cats  Occupation: Camera operator job  Activity: no regular exercise Diet: joined [employer], good water, fruits/vegetables daily, red meat 3x/wk, fish 2x/wk, more stevia and less regular sugar.  Switched to [facility] 10/2021.      Relevant past medical, surgical, family and social history reviewed and updated as indicated. Interim medical history since our last visit reviewed. Allergies and medications reviewed and updated. Outpatient Medications Prior to Visit  Medication Sig Dispense Refill   Ascorbic Acid (VITAMIN C) POWD Take 1,000 mg by mouth daily.     ASHWAGANDHA PO Take 375 mg by mouth at bedtime.     B Complex Vitamins (VITAMIN B COMPLEX PO) Take by mouth daily.     COLLAGEN PO Take 1 tablet by mouth daily.     diazepam (VALIUM) 5 MG tablet Take 5 mg by mouth at bedtime.     lamoTRIgine 50 MG TBDP Take 50 mg by mouth daily. Takes 2 tablets once daily     levothyroxine (SYNTHROID) 50 MCG tablet Take 1 tablet (50 mcg total) by mouth daily. 90 tablet 3   Magnesium 250 MG TABS Take 1 tablet (250 mg total) by mouth daily.  medium chain triglycerides (MCT OIL) oil Take by mouth 3 (three) times daily. Takes 3 TBSP daily     Melatonin 5 MG CAPS Take 2 capsules (10 mg total) by mouth at bedtime.  0   Multiple Vitamins-Minerals (ANTIOXIDANT FORMULA SG) capsule Take 1 capsule by mouth daily. Powder daily     NON FORMULARY Vitamins ADEK     Omega-3 Fatty Acids (OMEGA 3 PO) Take 690 mg by mouth daily. Nordic naturals     OVER THE COUNTER MEDICATION 2 capsules daily. KETO- metabolic ketosis support     Probiotic Product (PROBIOTIC DAILY PO) Take by mouth daily.     TURMERIC PO Take 1 tablet by mouth daily.     valACYclovir (VALTREX) 1000 MG tablet Take 2  tablets (2,000 mg total) by mouth daily as needed (fever blisters). 20 tablet 1   Cholecalciferol (VITAMIN D-3) 5000 UNITS TABS Take 2 tablets by mouth daily.      dextroamphetamine (DEXTROSTAT) 10 MG tablet Take 10 mg by mouth daily.     dextroamphetamine (DEXTROSTAT) 5 MG tablet Take 5 mg by mouth 2 (two) times daily.     dextroamphetamine (DEXTROSTAT) 10 MG tablet Take 1.5 tablets (15 mg total) by mouth daily.     dexmethylphenidate (FOCALIN) 10 MG tablet Take 15 mg by mouth daily.     No facility-administered medications prior to visit.     Per HPI unless specifically indicated in ROS section below Review of Systems  Constitutional:  Negative for activity change, appetite change, chills, fatigue, fever and unexpected weight change.  HENT:  Positive for congestion (recent URI). Negative for hearing loss.   Eyes:  Negative for visual disturbance.  Respiratory:  Negative for cough, chest tightness, shortness of breath and wheezing.   Cardiovascular:  Negative for chest pain, palpitations and leg swelling.  Gastrointestinal:  Negative for abdominal distention, abdominal pain, blood in stool, constipation, diarrhea, nausea and vomiting.  Genitourinary:  Negative for difficulty urinating and hematuria.  Musculoskeletal:  Negative for arthralgias, myalgias and neck pain.  Skin:  Negative for rash.  Neurological:  Negative for dizziness, seizures, syncope and headaches.  Hematological:  Negative for adenopathy. Does not bruise/bleed easily.  Psychiatric/Behavioral:  Positive for dysphoric mood. The patient is nervous/anxious.    Objective:  BP 126/80    Pulse 100    Temp (!) 97.5 F (36.4 C) (Temporal)    Ht 5\' 5"  (1.651 m)    Wt 174 lb 8 oz (79.2 kg)    LMP 03/04/2014    SpO2 99%    BMI 29.04 kg/m   Wt Readings from Last 3 Encounters:  12/14/21 174 lb 8 oz (79.2 kg)  11/20/21 177 lb 1 oz (80.3 kg)  09/26/20 177 lb 3 oz (80.4 kg)      Physical Exam Vitals and nursing note reviewed.   Constitutional:      Appearance: Normal appearance. She is not ill-appearing.  HENT:     Head: Normocephalic and atraumatic.     Right Ear: Tympanic membrane, ear canal and external ear normal. There is no impacted cerumen.     Left Ear: Tympanic membrane, ear canal and external ear normal. There is no impacted cerumen.  Eyes:     General:        Right eye: No discharge.        Left eye: No discharge.     Extraocular Movements: Extraocular movements intact.     Conjunctiva/sclera: Conjunctivae normal.     Pupils: Pupils  are equal, round, and reactive to light.  Neck:     Thyroid: No thyroid mass or thyromegaly.  Cardiovascular:     Rate and Rhythm: Normal rate and regular rhythm.     Pulses: Normal pulses.     Heart sounds: Normal heart sounds. No murmur heard. Pulmonary:     Effort: Pulmonary effort is normal. No respiratory distress.     Breath sounds: Normal breath sounds. No wheezing, rhonchi or rales.  Abdominal:     General: Bowel sounds are normal. There is no distension.     Palpations: Abdomen is soft. There is no mass.     Tenderness: There is no abdominal tenderness. There is no guarding or rebound.     Hernia: No hernia is present.  Musculoskeletal:     Cervical back: Normal range of motion and neck supple. No rigidity.     Right lower leg: No edema.     Left lower leg: No edema.  Lymphadenopathy:     Cervical: No cervical adenopathy.  Skin:    General: Skin is warm and dry.     Findings: No rash.  Neurological:     General: No focal deficit present.     Mental Status: She is alert. Mental status is at baseline.  Psychiatric:        Mood and Affect: Mood normal.        Behavior: Behavior normal.      Results for orders placed or performed in visit on 12/14/21  Lipid panel  Result Value Ref Range   Cholesterol, Total 188 100 - 199 mg/dL   Triglycerides 64 0 - 149 mg/dL   HDL 64 >39 mg/dL   VLDL Cholesterol Cal 12 5 - 40 mg/dL   LDL Chol Calc (NIH) 112  (H) 0 - 99 mg/dL   Chol/HDL Ratio 2.9 0.0 - 4.4 ratio  Hemoglobin A1c  Result Value Ref Range   Hgb A1c MFr Bld 5.6 4.8 - 5.6 %   Est. average glucose Bld gHb Est-mCnc 114 mg/dL   Lab Results  Component Value Date   CHOL 188 12/14/2021   HDL 64 12/14/2021   LDLCALC 112 (H) 12/14/2021   TRIG 64 12/14/2021   CHOLHDL 2.9 12/14/2021    Lab Results  Component Value Date   HGBA1C 5.6 12/14/2021    Assessment & Plan:  This visit occurred during the SARS-CoV-2 public health emergency.  Safety protocols were in place, including screening questions prior to the visit, additional usage of staff PPE, and extensive cleaning of exam room while observing appropriate contact time as indicated for disinfecting solutions.   Problem List Items Addressed This Visit     Healthcare maintenance - Primary (Chronic)    Preventative protocols reviewed and updated unless pt declined. Discussed healthy diet and lifestyle.       Subclinical hypothyroidism    Stable period on low dose levothyroxine.       MDD (major depressive disorder), recurrent episode, moderate ([place])    Followed by psych.       Attention deficit hyperactivity disorder (ADHD), predominantly inattentive type    Followed by psych.  Didn't tolerate focalin.       Postmenopausal    Ordered DEXA due to early menopause and hypothyroidism       Personal history of breast cancer    Gen surgery Byrnett retiring.  Will continue yearly mammogram.       Vitamin D deficiency    Levels well replaced on 4000  IU daily.       Cognitive and neurobehavioral dysfunction   Patient on ketogenic diet    Update labs.       Relevant Orders   Lipid panel (Completed)   Hemoglobin A1c (Completed)   Other Visit Diagnoses     Estrogen deficiency       Relevant Orders   DG Bone Density   Need for Tdap vaccination       Relevant Orders   Tdap vaccine greater than or equal to 7yo IM (Completed)        Meds ordered this encounter   Medications   Cholecalciferol 100 MCG (4000 UT) CAPS    Sig: Take 1 capsule (4,000 Units total) by mouth daily.   Orders Placed This Encounter  Procedures   DG Bone Density    Standing Status:   Future    Standing Expiration Date:   12/14/2022    Order Specific Question:   Reason for Exam (SYMPTOM  OR DIAGNOSIS REQUIRED)    Answer:   postmenopause    Order Specific Question:   Is the patient pregnant?    Answer:   No    Order Specific Question:   Preferred imaging location?    Answer:   [facility]   Tdap vaccine greater than or equal to 7yo IM   Lipid panel   Hemoglobin A1c     Patient instructions: Call [facility] to schedule bone density scan at your convenience.  Tdap today  Incorporate walking into the routine.  Good to see you today Return in 1 year for next physical.  Labs today.  Return in 4 months for fasting labs again on keto diet.   Follow up plan: Return in about 1 year (around 12/14/2022) for annual exam, prior fasting for blood work.  Ria Bush, MD

## 2021-12-14 NOTE — Patient Instructions (Addendum)
Call [facility] to schedule bone density scan at your convenience.  Tdap today  Incorporate walking into the routine.  Good to see you today Return in 1 year for next physical.  Labs today.  Return in 4 months for fasting labs again on keto diet.   Health Maintenance for Postmenopausal Women Menopause is a normal process in which your ability to get pregnant comes to an end. This process happens slowly over many months or years, usually between the ages of 14 and 31. Menopause is complete when you have missed your menstrual period for 12 months. It is important to talk with your health care provider about some of the most common conditions that affect women after menopause (postmenopausal women). These include heart disease, cancer, and bone loss (osteoporosis). Adopting a healthy lifestyle and getting preventive care can help to promote your health and wellness. The actions you take can also lower your chances of developing some of these common conditions. What are the signs and symptoms of menopause? During menopause, you may have the following symptoms: Hot flashes. These can be moderate or severe. Night sweats. Decrease in sex drive. Mood swings. Headaches. Tiredness (fatigue). Irritability. Memory problems. Problems falling asleep or staying asleep. Talk with your health care provider about treatment options for your symptoms. Do I need hormone replacement therapy? Hormone replacement therapy is effective in treating symptoms that are caused by menopause, such as hot flashes and night sweats. Hormone replacement carries certain risks, especially as you become older. If you are thinking about using estrogen or estrogen with progestin, discuss the benefits and risks with your health care provider. How can I reduce my risk for heart disease and stroke? The risk of heart disease, heart attack, and stroke increases as you age. One of the causes may be a change in the body's hormones  during menopause. This can affect how your body uses dietary fats, triglycerides, and cholesterol. Heart attack and stroke are medical emergencies. There are many things that you can do to help prevent heart disease and stroke. Watch your blood pressure High blood pressure causes heart disease and increases the risk of stroke. This is more likely to develop in people who have high blood pressure readings or are overweight. Have your blood pressure checked: Every 3-5 years if you are 34-15 years of age. Every year if you are 47 years old or older. Eat a healthy diet  Eat a diet that includes plenty of vegetables, fruits, low-fat dairy products, and lean protein. Do not eat a lot of foods that are high in solid fats, added sugars, or sodium. Get regular exercise Get regular exercise. This is one of the most important things you can do for your health. Most adults should: Try to exercise for at least 150 minutes each week. The exercise should increase your heart rate and make you sweat (moderate-intensity exercise). Try to do strengthening exercises at least twice each week. Do these in addition to the moderate-intensity exercise. Spend less time sitting. Even light physical activity can be beneficial. Other tips Work with your health care provider to achieve or maintain a healthy weight. Do not use any products that contain nicotine or tobacco. These products include cigarettes, chewing tobacco, and vaping devices, such as e-cigarettes. If you need help quitting, ask your health care provider. Know your numbers. Ask your health care provider to check your cholesterol and your blood sugar (glucose). Continue to have your blood tested as directed by your health care provider. Do  I need screening for cancer? Depending on your health history and family history, you may need to have cancer screenings at different stages of your life. This may include screening for: Breast cancer. Cervical  cancer. Lung cancer. Colorectal cancer. What is my risk for osteoporosis? After menopause, you may be at increased risk for osteoporosis. Osteoporosis is a condition in which bone destruction happens more quickly than new bone creation. To help prevent osteoporosis or the bone fractures that can happen because of osteoporosis, you may take the following actions: If you are 40-1 years old, get at least 1,000 mg of calcium and at least 600 international units (IU) of vitamin D per day. If you are older than age 60 but younger than age 101, get at least 1,200 mg of calcium and at least 600 international units (IU) of vitamin D per day. If you are older than age 54, get at least 1,200 mg of calcium and at least 800 international units (IU) of vitamin D per day. Smoking and drinking excessive alcohol increase the risk of osteoporosis. Eat foods that are rich in calcium and vitamin D, and do weight-bearing exercises several times each week as directed by your health care provider. How does menopause affect my mental health? Depression may occur at any age, but it is more common as you become older. Common symptoms of depression include: Feeling depressed. Changes in sleep patterns. Changes in appetite or eating patterns. Feeling an overall lack of motivation or enjoyment of activities that you previously enjoyed. Frequent crying spells. Talk with your health care provider if you think that you are experiencing any of these symptoms. General instructions See your health care provider for regular wellness exams and vaccines. This may include: Scheduling regular health, dental, and eye exams. Getting and maintaining your vaccines. These include: Influenza vaccine. Get this vaccine each year before the flu season begins. Pneumonia vaccine. Shingles vaccine. Tetanus, diphtheria, and pertussis (Tdap) booster vaccine. Your health care provider may also recommend other immunizations. Tell your health  care provider if you have ever been abused or do not feel safe at home. Summary Menopause is a normal process in which your ability to get pregnant comes to an end. This condition causes hot flashes, night sweats, decreased interest in sex, mood swings, headaches, or lack of sleep. Treatment for this condition may include hormone replacement therapy. Take actions to keep yourself healthy, including exercising regularly, eating a healthy diet, watching your weight, and checking your blood pressure and blood sugar levels. Get screened for cancer and depression. Make sure that you are up to date with all your vaccines. This information is not intended to replace advice given to you by your health care provider. Make sure you discuss any questions you have with your health care provider. Document Revised: 02/27/2021 Document Reviewed: 02/27/2021 Elsevier Patient Education  [employer].

## 2021-12-15 DIAGNOSIS — Z789 Other specified health status: Secondary | ICD-10-CM | POA: Insufficient documentation

## 2021-12-15 LAB — LIPID PANEL
Chol/HDL Ratio: 2.9 ratio (ref 0.0–4.4)
Cholesterol, Total: 188 mg/dL (ref 100–199)
HDL: 64 mg/dL (ref >39)
LDL Chol Calc (NIH): 112 mg/dL — ABNORMAL HIGH (ref 0–99)
Triglycerides: 64 mg/dL (ref 0–149)
VLDL Cholesterol Cal: 12 mg/dL (ref 5–40)

## 2021-12-15 LAB — HEMOGLOBIN A1C
Est. average glucose Bld gHb Est-mCnc: 114 mg/dL
Hgb A1c MFr Bld: 5.6 % (ref 4.8–5.6)

## 2021-12-15 NOTE — Assessment & Plan Note (Signed)
Preventative protocols reviewed and updated unless pt declined. Discussed healthy diet and lifestyle.  

## 2021-12-15 NOTE — Assessment & Plan Note (Addendum)
Stable period on low dose levothyroxine.

## 2021-12-15 NOTE — Assessment & Plan Note (Addendum)
Followed by psych.  Didn't tolerate focalin.

## 2021-12-15 NOTE — Assessment & Plan Note (Signed)
Update labs.  

## 2021-12-15 NOTE — Assessment & Plan Note (Signed)
Ordered DEXA due to early menopause and hypothyroidism

## 2021-12-15 NOTE — Assessment & Plan Note (Signed)
Levels well replaced on 4000 IU daily.

## 2021-12-15 NOTE — Assessment & Plan Note (Signed)
Followed by psych

## 2021-12-15 NOTE — Assessment & Plan Note (Addendum)
Gen surgery Byrnett retiring.  Will continue yearly mammogram.

## 2021-12-18 ENCOUNTER — Encounter: Payer: Self-pay | Admitting: Family Medicine

## 2021-12-18 DIAGNOSIS — F331 Major depressive disorder, recurrent, moderate: Secondary | ICD-10-CM

## 2021-12-19 NOTE — Telephone Encounter (Signed)
Mrs. Vanderkolk stated that Dr. Darnell Level didn't need to fill out form they just need to send in ov notes from the last 3 visit. And it must include her dx and the depression disorder. The fax number to [employer] [phone], the claim number is  [id number]

## 2021-12-25 NOTE — Telephone Encounter (Signed)
Forms printed and in Lisa's box.  ?

## 2021-12-25 NOTE — Telephone Encounter (Signed)
Faxed OV notes to [facility] at [phone], claim #: [id number]. ?

## 2022-01-11 ENCOUNTER — Encounter: Payer: Managed Care, Other (non HMO) | Attending: Psychology | Admitting: Psychology

## 2022-01-11 ENCOUNTER — Other Ambulatory Visit: Payer: Self-pay

## 2022-01-11 DIAGNOSIS — F411 Generalized anxiety disorder: Secondary | ICD-10-CM | POA: Diagnosis present

## 2022-01-11 DIAGNOSIS — G479 Sleep disorder, unspecified: Secondary | ICD-10-CM | POA: Insufficient documentation

## 2022-01-11 DIAGNOSIS — F410 Panic disorder [episodic paroxysmal anxiety] without agoraphobia: Secondary | ICD-10-CM | POA: Insufficient documentation

## 2022-01-11 DIAGNOSIS — R5382 Chronic fatigue, unspecified: Secondary | ICD-10-CM | POA: Insufficient documentation

## 2022-01-11 DIAGNOSIS — F09 Unspecified mental disorder due to known physiological condition: Secondary | ICD-10-CM | POA: Insufficient documentation

## 2022-01-11 DIAGNOSIS — F900 Attention-deficit hyperactivity disorder, predominantly inattentive type: Secondary | ICD-10-CM | POA: Diagnosis present

## 2022-01-11 NOTE — Progress Notes (Signed)
Neuropsychology Visit ? ?Patient:  Susan Soto  ? ?DOB: Jan 24, 1966 ? ?MR Number: [id number] ? ?Location: [facility] ?[place] [place] ?[id number] ?[place] [address] ?Dept: [phone] ? ?Date of Service: 01/11/2022 ? ?Start: 11 AM ?End: 12 PM ? ?Duration of Service: 1 Hour ? ?Today's visit was an in person visit that was conducted in my outpatient clinic office with the patient myself present. ? ?Provider/Observer:     Edgardo Roys PsyD ? ?Chief Complaint:      ?Chief Complaint  ?Patient presents with  ? Anxiety  ? Depression  ? ADHD  ? Sleeping Problem  ? Stress  ? Panic Attack  ? ? ?Reason For Service:     Donni Oglesby is a 56 year old female referred by Daymon Larsen, MD with [facility].  The patient has a history of sleep disturbance with reports of cognitive disturbance and bilateral carpal tunnel syndrome.  The patient has been out of work after taking a medical leave in 2017 but then returning to work in a different job.  The patient has a history of major depressive disorder, adult residual attention deficit disorder and potentially residual cognitive difficulties after being diagnosed and treated for breast cancer in 2009 with both chemotherapy and radiation.  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 ?  ?Patient reports that she was diagnosed with inattentive type attention deficit disorder in her early 29s and always had significant weakness keeping up with paperwork and documentation with her job.  The patient works as a Education officer, [employer].  The patient reports that she also had significant issues with major depressive disorder.  She reports that she took a medical leave of absence in 2017 after developing significant  exacerbation of her major depression with her mother passing away.  The patient returned to work after couple of months with an opportunity for a different job that had less demands and lower caseload.  Patient reports that she continued to have difficulty keeping up with any kind of paperwork and her previous strategies for dealing with that were not very helpful.  The patient reports that if she is not taking any medications that she just stays in bed, has no energy and significant difficulty with motivation.  The patient reports that this was true even when she was a young child. ?  ?The patient reports that she had an exacerbation of her difficulties after her chemotherapy treatment for breast cancer.  The patient reports that initially she experienced "chemo brain" but got better and then in 2017 her symptoms returned and she deteriorated.  The patient reports that typically she would have times of "hyperfocus" to get caught up now she cannot "hyperfocus" and has difficulty keeping up with paperwork.  The patient reports that she made improvements while out on FMLA after her mother and close aunt died. ?  ?The patient reports that she has seen a psychiatrist for 20 years in [place]. ?  ?Patient reports being extremely stressed at work and feeling like she is extremely behind on her caseload and paperwork.  Her job has allowed her to start coming in at 10 AM to help her get better sleep.  Even with this adjustment the patient has difficulty getting into work before 11 AM.  The patient reports that she cant  get her thoughts focused enough to do anything even at home.  She is trying to work long enough to retire early.  ?  ?The patient is not sleeping well but no significant findings on sleep study beyond delayed REM onset.  The patient had been on extreme low fat diet for many years but has stopped this diet.   ?  ?The patient now has sudden onset of anxiety and panic attacks.   ?  ?03/28/20 Update:  The patient  returned for visit and brought the paperwork that she had forgotten in Feb.  The patient described a a decline in memory and executive functioning along with increasing anxiety and worsening of ADD/attention symptoms.  She describes new onset panic attacks within the past 6 months.  "pins and needles and pain in fingers described".  She describes a decline in her ability to perform tasks that involve concentration.  She continues to use Dexedrine for attention along with valium 1-2 times per day.  The patient had been out on FMLA due to major depresion and new onset of anxiety.  FMLA has run out and the patient has had to stop working but her short-term disability has denied her so far, attributing it to a pre-existing condition according to patient.  The patient describes severe insomnia (not OSA) but has had abnormalities on sleep study related to reduction in REM sleep.   ? ?04/11/2020 update: The patient return for a visit today and reports that she has been having some improvements in her sleep pattern and there has been some improvements in her fatigue during the day.  She has reduced how much stimulants she is taking as well as taking less Valium.  The patient reports that she continues to have significant difficulties getting going in the morning but she has been going to bed earlier at night and getting up in the morning sooner and on many days she is able to get up at sunrise.  She has been following sleep hygiene efforts. ? ?05/26/2020: The patient reports that she is continued to have some improvements but continues with significant attentional issues and disturbed sleep.  The patient is continued with the lower dose of psychostimulant medications but this has a negative impact on her attention and concentration but does seem to help with her anxiety symptoms.  The patient has continued to work on sleep hygiene issues and has improved somewhat and will be following up with her neurologist  soon. ? ?06/20/2020: The patient reports that she has continued to make improvements particular around her sleep functioning.  The patient reports that she went to the beach to spend some time as well as visit her friend and was away from her husband another household stressors.  She actively worked on sleep hygiene issues and focused on very fundamental behavioral consistencies.  She reports that she did much better as far as her anxiety and stress.  There continues to be a lot of frustration and stressors associated with her husband's activities and behaviors and the patient is working on more focus on taking care of herself rather than trying to fix all intervene with his issues that he himself is not acknowledging or actively trying to address. ? ?09/08/2020: The patient missed her last appointment as she had documented the time in a confusing way.  It was an 8:00 appointment and she typically has great difficulty getting herself organized to these appointments and miss documented in her calendar and was going to be too late  for the appointment before she realized this discrepancy.  The patient reports that in someway she is done better and has been actively working on a number of things and has made more accomplishments in this period of time then she typically gives herself credit for.  The patient is done very well working on foundational issues and has been improving her behavioral aspects of sleep hygiene, paying close attention all to good nutrition and physical action and activity.  The patient reports that she continues to have difficulty maintaining attention and focus and there have been efforts to adjust some of her medications for optimization.  The patient reports that there continue to be significant stressors with her husband and her husband's psychiatric issues of severe OCD including hoarding type behaviors. ? ?10/06/2020: The patient reports that she has continued to have significant issues of  cognitive difficulties and fatigue during the day.  Patient continues to have sleep disturbance and we talked about some very practical issues around sleep hygiene including sleeping in her room without pets that she reports will wa

## 2022-02-05 ENCOUNTER — Inpatient Hospital Stay: Admission: RE | Admit: 2022-02-05 | Payer: Managed Care, Other (non HMO) | Source: Ambulatory Visit

## 2022-02-27 ENCOUNTER — Encounter: Payer: Managed Care, Other (non HMO) | Attending: Psychology | Admitting: Psychology

## 2022-02-27 DIAGNOSIS — F411 Generalized anxiety disorder: Secondary | ICD-10-CM | POA: Diagnosis present

## 2022-02-27 DIAGNOSIS — F410 Panic disorder [episodic paroxysmal anxiety] without agoraphobia: Secondary | ICD-10-CM | POA: Diagnosis present

## 2022-02-27 DIAGNOSIS — G479 Sleep disorder, unspecified: Secondary | ICD-10-CM | POA: Diagnosis present

## 2022-02-27 DIAGNOSIS — R5382 Chronic fatigue, unspecified: Secondary | ICD-10-CM | POA: Insufficient documentation

## 2022-02-27 DIAGNOSIS — F09 Unspecified mental disorder due to known physiological condition: Secondary | ICD-10-CM | POA: Insufficient documentation

## 2022-02-27 DIAGNOSIS — F900 Attention-deficit hyperactivity disorder, predominantly inattentive type: Secondary | ICD-10-CM | POA: Insufficient documentation

## 2022-03-21 ENCOUNTER — Other Ambulatory Visit: Payer: Managed Care, Other (non HMO)

## 2022-03-22 ENCOUNTER — Encounter: Payer: Self-pay | Admitting: Psychology

## 2022-03-22 NOTE — Progress Notes (Signed)
Neuropsychology Visit  Patient:  Susan Soto   DOB: 24-Apr-1966  MR Number: [id number]  Location: [facility] [place], [place] [id number] [place] [id number] Dept: [phone]  Date of Service: 02/27/2022  Start: 3 PM End: 4 PM  Duration of Service: 1 Hour  Today's visit was an in person visit that was conducted in my outpatient clinic office with the patient myself present.  Provider/Observer:     Edgardo Roys PsyD  Chief Complaint:      Chief Complaint  Patient presents with   Anxiety   Depression   ADHD   Sleeping Problem   Stress   Panic Attack    Reason For Service:     Susan Soto is a 56 year old female referred by Daymon Larsen, MD with [facility].  The patient has a history of sleep disturbance with reports of cognitive disturbance and bilateral carpal tunnel syndrome.  The patient has been out of work after taking a medical leave in 2017 but then returning to work in a different job.  The patient has a history of major depressive disorder, adult residual attention deficit disorder and potentially residual cognitive difficulties after being diagnosed and treated for breast cancer in 2009 with both chemotherapy and radiation.  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   Patient reports that she was diagnosed with inattentive type attention deficit disorder in her early 47s and always had significant weakness keeping up with paperwork and documentation with her job.  The patient works as a Education officer, [employer].  The patient reports that she also had significant issues with major depressive disorder.  She reports that she took a medical leave of absence in 2017 after developing significant exacerbation  of her major depression with her mother passing away.  The patient returned to work after couple of months with an opportunity for a different job that had less demands and lower caseload.  Patient reports that she continued to have difficulty keeping up with any kind of paperwork and her previous strategies for dealing with that were not very helpful.  The patient reports that if she is not taking any medications that she just stays in bed, has no energy and significant difficulty with motivation.  The patient reports that this was true even when she was a young child.   The patient reports that she had an exacerbation of her difficulties after her chemotherapy treatment for breast cancer.  The patient reports that initially she experienced "chemo brain" but got better and then in 2017 her symptoms returned and she deteriorated.  The patient reports that typically she would have times of "hyperfocus" to get caught up now she cannot "hyperfocus" and has difficulty keeping up with paperwork.  The patient reports that she made improvements while out on FMLA after her mother and close aunt died.   The patient reports that she has seen a psychiatrist for 20 years in [place].   Patient reports being extremely stressed at work and feeling like she is extremely behind on her caseload and paperwork.  Her job has allowed her to start coming in at 10 AM to help her get better sleep.  Even with this adjustment the patient has difficulty getting into work before 11 AM.  The patient reports that she cant  get her thoughts focused enough to do anything even at home.  She is trying to work long enough to retire early.    The patient is not sleeping well but no significant findings on sleep study beyond delayed REM onset.  The patient had been on extreme low fat diet for many years but has stopped this diet.     The patient now has sudden onset of anxiety and panic attacks.     03/28/20 Update:  The patient returned for  visit and brought the paperwork that she had forgotten in Feb.  The patient described a a decline in memory and executive functioning along with increasing anxiety and worsening of ADD/attention symptoms.  She describes new onset panic attacks within the past 6 months.  "pins and needles and pain in fingers described".  She describes a decline in her ability to perform tasks that involve concentration.  She continues to use Dexedrine for attention along with valium 1-2 times per day.  The patient had been out on FMLA due to major depresion and new onset of anxiety.  FMLA has run out and the patient has had to stop working but her short-term disability has denied her so far, attributing it to a pre-existing condition according to patient.  The patient describes severe insomnia (not OSA) but has had abnormalities on sleep study related to reduction in REM sleep.    04/11/2020 update: The patient return for a visit today and reports that she has been having some improvements in her sleep pattern and there has been some improvements in her fatigue during the day.  She has reduced how much stimulants she is taking as well as taking less Valium.  The patient reports that she continues to have significant difficulties getting going in the morning but she has been going to bed earlier at night and getting up in the morning sooner and on many days she is able to get up at sunrise.  She has been following sleep hygiene efforts.  05/26/2020: The patient reports that she is continued to have some improvements but continues with significant attentional issues and disturbed sleep.  The patient is continued with the lower dose of psychostimulant medications but this has a negative impact on her attention and concentration but does seem to help with her anxiety symptoms.  The patient has continued to work on sleep hygiene issues and has improved somewhat and will be following up with her neurologist soon.  06/20/2020: The patient  reports that she has continued to make improvements particular around her sleep functioning.  The patient reports that she went to the beach to spend some time as well as visit her friend and was away from her husband another household stressors.  She actively worked on sleep hygiene issues and focused on very fundamental behavioral consistencies.  She reports that she did much better as far as her anxiety and stress.  There continues to be a lot of frustration and stressors associated with her husband's activities and behaviors and the patient is working on more focus on taking care of herself rather than trying to fix all intervene with his issues that he himself is not acknowledging or actively trying to address.  09/08/2020: The patient missed her last appointment as she had documented the time in a confusing way.  It was an 8:00 appointment and she typically has great difficulty getting herself organized to these appointments and miss documented in her calendar and was going to be too late  for the appointment before she realized this discrepancy.  The patient reports that in someway she is done better and has been actively working on a number of things and has made more accomplishments in this period of time then she typically gives herself credit for.  The patient is done very well working on foundational issues and has been improving her behavioral aspects of sleep hygiene, paying close attention all to good nutrition and physical action and activity.  The patient reports that she continues to have difficulty maintaining attention and focus and there have been efforts to adjust some of her medications for optimization.  The patient reports that there continue to be significant stressors with her husband and her husband's psychiatric issues of severe OCD including hoarding type behaviors.  10/06/2020: The patient reports that she has continued to have significant issues of cognitive difficulties and  fatigue during the day.  Patient continues to have sleep disturbance and we talked about some very practical issues around sleep hygiene including sleeping in her room without pets that she reports will wake her up at various times at night.  Patient continues to have significant pain continued severe sleep disturbance but has improved sleep hygiene and sleep pattern significantly since initially being here.  11/10/2020: The patient reports that she has been off track with regard to her sleep hygiene regimen and has noticed a worsening of her cognitive difficulties including attentional issues, anxiety stress, executive function etc. when she does not follow her sleep hygiene regimen is closely as she had been.  The patient reports that there have been medical complications recently with both her and her husband developing symptomatic COVID-19 infection with the patient having more than a week of illness.  During the time her husband was sick he did completely stop drinking which she was hopeful would lead to his continued cessation of alcohol use.  However, as soon as he felt well enough to go back to longstanding habits he did return to his alcohol use and consumption but initially restricted it to drinking beer but is now returned to his previous multi sources of alcohol use.  The patient reports that she has gotten better about being stressed over the impacts of his alcohol abuse realizing that there is little to nothing that she can do to manage or control his activities.  The patient is focused on her wellbeing overall and is learned a number of viable coping skills and implemented those to help her symptoms.  However, she continues to have significant attentional deficits and other cognitive deficits.  01/05/2021: The patient appeared to be in some of the best status she has been since I started seeing her.  Her anxiety was down and she had good cognitive and mental status.  The patient's mood was better  and she reports that she has been sleeping better although recently she has not been doing all of the sleep hygiene that she had done to affect improved sleep patterns.  Psychosocial stressors have been a little bit better with her husband returning to work in his office during the day which gives her a break from being around him 24/7.  Her husband continues to consume significant amounts of alcohol which limit their healthy interactions.  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  The patient reports that things have continued to be very similar at home although her husband has gone back to work.  The patient also house that for a friend for a weekend that also went very well.  The patient reports that she has had times where her anxiety seems to exacerbate but overall she continues to do better.  04/26/2021: The patient reports that her uncle is moved into the office structure that they built behind their house that was originally built to house her husband's home office that was not being used.  She reports that initially her husband was resistant to this but decided it was okay.  She reports that overall this is turned out well and it is helped motivate her in some aspects.  The patient continues to struggle with completing task but was able to help get the structure ready for her uncle.  He has not overextended himself into their day-to-day lives and it does appear to be going fairly well.  The patient reports that her pain continues as well as stress of depression but she has been having fewer panic attacks.  09/11/2021: The patient reports that things have been changing around her house significantly lately.  The patient reports that her and  her husband have decided to look at selling their house and they have had to be doing a great deal of things around the house or getting ready to to prepare it for sale.  The plan is to move into a smaller home but her husband's compulsive buying and hoarding of types of clothes etc. are having to be changed and fixed.  There is plan to do some remodeling in the house which are all can fall on her shoulders.  However, she is looking forward to making some significant changes which will help her overall.  We continue to work on therapeutic interventions around her cognitive issues and depression and anxiety.  11/21/2021: The patient reports that there have been some stressors between her and her husband recently that have been more than typical.  He continues to abuse alcohol but after this being specifically called out by their son who is now living with them he has made some attempts at reducing the amount of alcohol he is consuming.  His strategy has been to switch from distilled liquor to beer.  While this is produced some changes and reduced the level of his intoxication it is only primarily been done during the week and on weekends he goes back to consuming larger amounts of distilled alcohol.  The patient is very frustrated by his unwillingness to transfer money into her account so she could pay her insurance bills while at the same time continuing his compulsive buying habits and hoarding behaviors etc.  The patient is, however, actively working on trying to take care of herself first and feels like she is making some personal gains and doing better overall.  She is actively working on foundational issues such as diet, sleep and physical activity.  She continues to have attentional issues and other cognitive issues secondary to significant attention and concentration deficits.  Patient reports an improvement in her depression and anxiety symptoms.  01/11/2022: It has been several months since I saw the  patient last and she reports that some of the stressors around her house have been better with her husband drinking less alcohol and her working on some of  the issues related to her panic events and anxiety.  However, she has had some worsening of some of her attentional difficulties fatigue and motivation.  There have been difficulties with medications particularly stimulant type medications due to national shortage of these medications and some attempts at changing medications.  The patient reports that this is left her with extended days to weeks of time without medications.  There was an attempt to switch to Focalin at 1 point and while she felt it was initially helpful she began having significant changes in her mood state and worsening depression.  As she ran out of this medicine relatively quickly her depressive symptoms improved.  They are still working on trying to adjust and acquire psychostimulant medications.  Today we continue to work on therapeutic interventions around her panic disorders, attentional issues and neurocognitive difficulties.  The patient's attention and concentration and memory along with executive functioning continue to be quite impaired.  Sleep continues to be disturbed.  02/27/2022: Following up with the patient she continues to have a lot of stressors around psychosocial issues.  Her attention and cognitive difficulties and ongoing issues with functional neurological issues remain quite problematic.  The patient has actively been working on therapeutic interventions and reports that overall she is improving but there continues to be a lot of psychosocial stressors with her husband's alcohol use in particular.  We continue to work on therapeutic interventions around issues related to her panic disorder and attentional issues and other neurocognitive difficulties.  Treatment Interventions:  Cognitive Behavioral and coping strategies along with continued history.  Participation  Level:   Active  Participation Quality:  Appropriate and Redirectable      Behavioral Observation:  Well Groomed, Alert, and Appropriate.   Current Psychosocial Factors: The patient continues to struggle with basic status around the house and while they have improved and she has completed many more tasks than she had been she continues to have to work very hard to not get sidetracked in the middle of various tasks.  Content of Session:   Reviewed current symptoms and worked on coping and adjustment issues.    Effectiveness of Interventions: Patient is very motivated to improve and working hard on some of the challenging behavioral changes we have done around sleep hygiene.    Target Goals:   Improve sleep, work on anxiety and depression along with cognitive deficits.  Goals Last Reviewed:   11/21/2020  Goals Addressed Today:    Continued with sleep hygiene issues and concerns around cognitive deficits and coping.  Impression/Diagnosis:   Via Rosado is a 56 year old female referred by Daymon Larsen, MD with [facility].  The patient has a history of sleep disturbance with reports of cognitive disturbance and bilateral carpal tunnel syndrome.  The patient has been out of work after taking a medical leave in 2017 but then returning to work in a different job.  The patient has a history of major depressive disorder, adult residual attention deficit disorder and potentially residual cognitive difficulties after being diagnosed and treated for breast cancer in 2009 with both chemotherapy and radiation.  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  The patient continues with significant anxiety, pain and sleep disturbance.  The patient has been actively working on therapeutic interventions today we worked on some very practical strategies for  aiding her free recall of information and strategies  for completing tasks.  10/06/2020: The patient reports that she has continued to have significant issues of cognitive difficulties and fatigue during the day.  Patient continues to have sleep disturbance and we talked about some very practical issues around sleep hygiene including sleeping in her room without pets that she reports will wake her up at various times at night.  Patient continues to have significant pain continued severe sleep disturbance but has improved sleep hygiene and sleep pattern significantly since initially being here.  11/10/2020: The patient reports that she has been off track with regard to her sleep hygiene regimen and has noticed a worsening of her cognitive difficulties including attentional issues, anxiety stress, executive function etc. when she does not follow her sleep hygiene regimen is closely as she had been.  The patient reports that there have been medical complications recently with both her and her husband developing symptomatic COVID-19 infection with the patient having more than a week of illness.  During the time her husband was sick he did completely stop drinking which she was hopeful would lead to his continued cessation of alcohol use.  However, as soon as he felt well enough to go back to longstanding habits he did return to his alcohol use and consumption but initially restricted it to drinking beer but is now returned to his previous multi sources of alcohol use.  The patient reports that she has gotten better about being stressed over the impacts of his alcohol abuse realizing that there is little to nothing that she can do to manage or control his activities.  The patient is focused on her wellbeing overall and is learned a number of viable coping skills and implemented those to help her symptoms.  However, she continues to have significant attentional deficits and other cognitive deficits.  01/05/2021: The  patient appeared to be in some of the best status she has been since I started seeing her.  Her anxiety was down and she had good cognitive and mental status.  The patient's mood was better and she reports that she has been sleeping better although recently she has not been doing all of the sleep hygiene that she had done to affect improved sleep patterns.  Psychosocial stressors have been a little bit better with her husband returning to work in his office during the day which gives her a break from being around him 24/7.  Her husband continues to consume significant amounts of alcohol which limit their healthy interactions.  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  The patient reports that things have continued to be very similar at home although her husband has gone back to work.  The patient also house that for a friend for a weekend that also went very well.  The patient reports that she has had times where her anxiety seems to exacerbate but overall she continues to do better.  04/26/2021: The patient reports that her uncle is moved into the office structure that they built behind their house that was originally built to house her husband's home office that was not being used.  She reports that initially her husband was resistant to this but decided it was okay.  She reports  that overall this is turned out well and it is helped motivate her in some aspects.  The patient continues to struggle with completing task but was able to help get the structure ready for her uncle.  He has not overextended himself into their day-to-day lives and it does appear to be going fairly well.  The patient reports that her pain continues as well as stress of  depression but she has been having fewer panic attacks.  09/11/2021: The patient reports that things have been changing around her house significantly lately.  The patient reports that her and her husband have decided to look at selling their house and they have had to be doing a great deal of things around the house or getting ready to to prepare it for sale.  The plan is to move into a smaller home but her husband's compulsive buying and hoarding of types of clothes etc. are having to be changed and fixed.  There is plan to do some remodeling in the house which are all can fall on her shoulders.  However, she is looking forward to making some significant changes which will help her overall.  We continue to work on therapeutic interventions around her cognitive issues and depression and anxiety.  11/21/2021: The patient reports that there have been some stressors between her and her husband recently that have been more than typical.  He continues to abuse alcohol but after this being specifically called out by their son who is now living with them he has made some attempts at reducing the amount of alcohol he is consuming.  His strategy has been to switch from distilled liquor to beer.  While this is produced some changes and reduced the level of his intoxication it is only primarily been done during the week and on weekends he goes back to consuming larger amounts of distilled alcohol.  The patient is very frustrated by his unwillingness to transfer money into her account so she could pay her insurance bills while at the same time continuing his compulsive buying habits and hoarding behaviors etc.  The patient is, however, actively working on trying to take care of herself first and feels like she is making some personal gains and doing better overall.  She is actively working on foundational issues such as diet, sleep and physical activity.  She continues to have attentional issues and other cognitive issues  secondary to significant attention and concentration deficits.  Patient reports an improvement in her depression and anxiety symptoms.  02/27/2022: Following up with the patient she continues to have a lot of stressors around psychosocial issues.  Her attention and cognitive difficulties and ongoing issues with functional neurological issues remain quite problematic.  The patient has actively been working on therapeutic interventions and reports that overall she is improving but there continues to be a lot of psychosocial stressors with her husband's alcohol use in particular.  We continue to work on therapeutic interventions around issues related to her panic disorder and attentional issues and other neurocognitive difficulties.  Diagnosis:   Cognitive and neurobehavioral dysfunction  Chronic fatigue  Sleep disturbance  Panic disorder  Generalized anxiety disorder  Attention deficit hyperactivity disorder (ADHD), predominantly inattentive type    Ilean Skill, Psy.D. Clinical Psychologist Neuropsychologist

## 2022-03-27 ENCOUNTER — Ambulatory Visit
Admission: RE | Admit: 2022-03-27 | Discharge: 2022-03-27 | Disposition: A | Discharge status: Home / Self Care | Payer: Managed Care, Other (non HMO) | Source: Ambulatory Visit | Attending: Family Medicine | Admitting: Family Medicine

## 2022-03-27 DIAGNOSIS — E2839 Other primary ovarian failure: Secondary | ICD-10-CM | POA: Diagnosis not present

## 2022-04-01 ENCOUNTER — Encounter: Payer: Self-pay | Admitting: Family Medicine

## 2022-04-01 DIAGNOSIS — M810 Age-related osteoporosis without current pathological fracture: Secondary | ICD-10-CM | POA: Insufficient documentation

## 2022-04-01 HISTORY — DX: Age-related osteoporosis without current pathological fracture: M81.0

## 2022-04-04 ENCOUNTER — Encounter: Payer: Self-pay | Admitting: Family Medicine

## 2022-04-04 NOTE — Telephone Encounter (Addendum)
Spoke with pt scheduling OV on 04/18/22 at 12:30 to discuss osteoporosis tx options.  I reviewed message concerning results.  Pt verbalizes understanding of the message but has questions about some of the findings in the report. (See pt's comment in MyChart message)

## 2022-04-09 ENCOUNTER — Other Ambulatory Visit: Payer: Self-pay | Admitting: Family Medicine

## 2022-04-09 DIAGNOSIS — E038 Other specified hypothyroidism: Secondary | ICD-10-CM

## 2022-04-09 DIAGNOSIS — M810 Age-related osteoporosis without current pathological fracture: Secondary | ICD-10-CM

## 2022-04-09 DIAGNOSIS — E559 Vitamin D deficiency, unspecified: Secondary | ICD-10-CM

## 2022-04-09 DIAGNOSIS — Z789 Other specified health status: Secondary | ICD-10-CM

## 2022-04-13 ENCOUNTER — Other Ambulatory Visit: Payer: Managed Care, Other (non HMO)

## 2022-04-18 ENCOUNTER — Ambulatory Visit (INDEPENDENT_AMBULATORY_CARE_PROVIDER_SITE_OTHER): Payer: Managed Care, Other (non HMO) | Admitting: Family Medicine

## 2022-04-18 ENCOUNTER — Encounter: Payer: Self-pay | Admitting: Family Medicine

## 2022-04-18 VITALS — BP 128/80 | HR 81 | Temp 97.4°F | Ht 65.0 in | Wt 157.2 lb

## 2022-04-18 DIAGNOSIS — R002 Palpitations: Secondary | ICD-10-CM | POA: Diagnosis not present

## 2022-04-18 DIAGNOSIS — N3946 Mixed incontinence: Secondary | ICD-10-CM

## 2022-04-18 DIAGNOSIS — E038 Other specified hypothyroidism: Secondary | ICD-10-CM | POA: Diagnosis not present

## 2022-04-18 DIAGNOSIS — M810 Age-related osteoporosis without current pathological fracture: Secondary | ICD-10-CM | POA: Diagnosis not present

## 2022-04-18 DIAGNOSIS — R350 Frequency of micturition: Secondary | ICD-10-CM

## 2022-04-18 DIAGNOSIS — I493 Ventricular premature depolarization: Secondary | ICD-10-CM

## 2022-04-18 DIAGNOSIS — E559 Vitamin D deficiency, unspecified: Secondary | ICD-10-CM

## 2022-04-18 DIAGNOSIS — Z780 Asymptomatic menopausal state: Secondary | ICD-10-CM

## 2022-04-18 DIAGNOSIS — Z789 Other specified health status: Secondary | ICD-10-CM

## 2022-04-18 LAB — POC URINALSYSI DIPSTICK (AUTOMATED)
Bilirubin, UA: NEGATIVE
Blood, UA: NEGATIVE
Glucose, UA: NEGATIVE
Leukocytes, UA: NEGATIVE
Nitrite, UA: NEGATIVE
Protein, UA: NEGATIVE
Spec Grav, UA: 1.01 (ref 1.010–1.025)
Urobilinogen, UA: 0.2 E.U./dL
pH, UA: 6 (ref 5.0–8.0)

## 2022-04-18 MED ORDER — MIRABEGRON ER 25 MG PO TB24
25.0000 mg | ORAL_TABLET | Freq: Every day | ORAL | 6 refills | Status: DC
Start: 2022-04-18 — End: 2022-12-17

## 2022-04-18 MED ORDER — VALACYCLOVIR HCL 1 G PO TABS: 2000.0000 mg | ORAL_TABLET | Freq: Two times a day (BID) | ORAL | 1 refills | Status: DC

## 2022-04-18 NOTE — Progress Notes (Signed)
Patient ID: Susan Soto, female    DOB: 1965-12-04, 56 y.o.   MRN: [id number]  This visit was conducted in person.  BP 128/80   Pulse 81   Temp (!) 97.4 F (36.3 C) (Temporal)   Ht '5\' 5"'$  (1.651 m)   Wt 157 lb 4 oz (71.3 kg)   LMP 03/04/2014   SpO2 99%   BMI 26.17 kg/m    CC: osteoporosis, palpitations, urinary frequency  Subjective:   HPI: Susan Soto is a 56 y.o. female presenting on 04/18/2022 for Osteoporosis (Here to discuss tx options and blood work. ), Palpitations (C/o heart palpitations- usually at night . Started about 2 mos ago.  Denies chest pain. ), and Urinary Frequency (C/o having to urinate often- worsening.  Denies any other urinary sxs. Started mos ago. )   2 mo h/o night time palpitations described as "beating out of rhythm" associated with fatigue. Episodes last a few minutes, also notes easy heart racing with exertion.  She is on dextroamphetamine '20mg'$  1/2 tab daily - issues with supply ([employer] surgeon).  She took Zenzetti new stimulant with was more effective, but now off this.  Normally drinks 16 oz coffee, has recently started BHB ketone supplement. Also takes occasional energy drink mid day.   Notes increased urinary frequency and urgency over the past few months.  No dysuria, hematuria, f/n/v, flank or abdominal.  Feels she fully emptying bladder.  + stress incontinence symptoms.  3 pregnancies, 2 deliveries with extensive episiotimies with residual scar tissue (1st delivery needed vacuum suctioning).  Notes vaginal pressure when straining for BM.  + foot in floor and key in door phenomenon.   OP - new diagnosis by DEXA earlier this month.  DEXA 03/2022 - T -2.6 L femur neck  She is postmenopausal and has hypothyroidism on levothyroxine 37mg daily.      Relevant past medical, surgical, family and social history reviewed and updated as indicated. Interim medical history since our last visit reviewed. Allergies and medications reviewed and  updated. Outpatient Medications Prior to Visit  Medication Sig Dispense Refill   Ascorbic Acid (VITAMIN C) POWD Take 1,000 mg by mouth daily.     ASHWAGANDHA PO Take 375 mg by mouth at bedtime.     B Complex Vitamins (VITAMIN B COMPLEX PO) Take by mouth daily.     Cholecalciferol 100 MCG (4000 UT) CAPS Take 1 capsule (4,000 Units total) by mouth daily.     COLLAGEN PO Take 1 tablet by mouth daily.     dextroamphetamine (DEXTROSTAT) 10 MG tablet Take 1.5 tablets (15 mg total) by mouth daily.     diazepam (VALIUM) 5 MG tablet Take 5 mg by mouth at bedtime.     levothyroxine (SYNTHROID) 50 MCG tablet Take 1 tablet (50 mcg total) by mouth daily. 90 tablet 3   Magnesium 250 MG TABS Take 1 tablet (250 mg total) by mouth daily.     medium chain triglycerides (MCT OIL) oil Take by mouth 3 (three) times daily. Takes 3 TBSP daily     Multiple Vitamins-Minerals (ANTIOXIDANT FORMULA SG) capsule Take 1 capsule by mouth daily. Powder daily     NON FORMULARY Vitamins ADEK     Omega-3 Fatty Acids (OMEGA 3 PO) Take 690 mg by mouth daily. Nordic naturals     OVER THE COUNTER MEDICATION 2 capsules daily. KETO- metabolic ketosis support     Probiotic Product (PROBIOTIC DAILY PO) Take by mouth daily.  TURMERIC PO Take 1 tablet by mouth daily.     valACYclovir (VALTREX) 1000 MG tablet Take 2 tablets (2,000 mg total) by mouth daily as needed (fever blisters). 20 tablet 1   lamoTRIgine 50 MG TBDP Take 50 mg by mouth daily. Takes 2 tablets once daily     Melatonin 5 MG CAPS Take 2 capsules (10 mg total) by mouth at bedtime.  0   No facility-administered medications prior to visit.     Per HPI unless specifically indicated in ROS section below Review of Systems  Objective:  BP 128/80   Pulse 81   Temp (!) 97.4 F (36.3 C) (Temporal)   Ht '5\' 5"'$  (1.651 m)   Wt 157 lb 4 oz (71.3 kg)   LMP 03/04/2014   SpO2 99%   BMI 26.17 kg/m   Wt Readings from Last 3 Encounters:  04/18/22 157 lb 4 oz (71.3 kg)   12/14/21 174 lb 8 oz (79.2 kg)  11/20/21 177 lb 1 oz (80.3 kg)      Physical Exam Vitals and nursing note reviewed.  Constitutional:      Appearance: Normal appearance. She is not ill-appearing.  Cardiovascular:     Rate and Rhythm: Normal rate and regular rhythm.     Pulses: Normal pulses.     Heart sounds: Normal heart sounds. No murmur heard. Pulmonary:     Effort: Pulmonary effort is normal. No respiratory distress.     Breath sounds: Normal breath sounds. No wheezing, rhonchi or rales.  Neurological:     Mental Status: She is alert.  Psychiatric:        Mood and Affect: Mood normal.        Behavior: Behavior normal.       Results for orders placed or performed in visit on 04/18/22  POCT Urinalysis Dipstick (Automated)  Result Value Ref Range   Color, UA light yellow    Clarity, UA clear    Glucose, UA Negative Negative   Bilirubin, UA negative    Ketones, UA +/-    Spec Grav, UA 1.010 1.010 - 1.025   Blood, UA negative    pH, UA 6.0 5.0 - 8.0   Protein, UA Negative Negative   Urobilinogen, UA 0.2 0.2 or 1.0 E.U./dL   Nitrite, UA negative    Leukocytes, UA Negative Negative   Lab Results  Component Value Date   TSH 1.970 11/20/2021    Lab Results  Component Value Date   CREATININE 0.64 11/20/2021   BUN 18 11/20/2021   NA 140 11/20/2021   K 4.7 11/20/2021   CL 100 11/20/2021   CO2 26 11/20/2021    Lab Results  Component Value Date   WBC 6.9 11/20/2021   HGB 14.5 11/20/2021   HCT 42.4 11/20/2021   MCV 88 11/20/2021   PLT 286 11/20/2021    Lab Results  Component Value Date   CHOL 188 12/14/2021   HDL 64 12/14/2021   LDLCALC 112 (H) 12/14/2021   TRIG 64 12/14/2021   CHOLHDL 2.9 12/14/2021    EKG - NSR rate 60s, normal axis, intervals, no hypertrophy or acute ST/T changes.   Assessment & Plan:   Problem List Items Addressed This Visit     Subclinical hypothyroidism    Update thyroid function.  Continue levothyroxine 50 mcg daily.       Postmenopausal   Mixed stress and urge urinary incontinence    UA today normal. Describes both stress and urge incontinence symptoms. Discussed  Kegel exercises, pelvic floor physical therapy, evaluation by uro or urogyn to discuss pessary. Discussed medication management for urge incontinence and overactive bladder symptoms including antimuscarinics and Myrbetriq.  She prefers to avoid antimuscarinic side effects so we will start Myrbetriq 25 mg daily.  Stay with effect.      Relevant Medications   mirabegron ER (MYRBETRIQ) 25 MG TB24 tablet   Vitamin D deficiency    Update levels on 4000 units daily.      Symptomatic PVCs   Patient on ketogenic diet    She continues following a clean ketogenic diet.  Update labs including lipid panel.      Osteoporosis    Reviewed recent diagnosis as well as treatment options.  Reviewed recommended daily dietary calcium and vitamin D intake.  Recommend regular weightbearing exercise.  Discussed bisphosphonates and Prolia, mechanism of action, common side effects and adverse effects to watch for including atypical wrist fracture and jaw bone use.  Reviewed correct administration of oral bisphosphonate.  Handout provided on osteoporosis, Fosamax and Prolia for patient to investigate.  I asked her to let me know if interested in starting medication.      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  Discussed anticipate recurrent issue.  Check labs for other contributors. EKG normal today.       Relevant Orders   EKG 12-Lead (Completed)   CBC with Differential/Platelet   VITAMIN D 25 Hydroxy (Vit-D Deficiency, Fractures)   Lipid panel   TSH   T4, free   Basic metabolic panel   Magnesium   Other Visit Diagnoses     Urinary frequency       Relevant Orders   POCT Urinalysis Dipstick (Automated)  (Completed)        Meds ordered this encounter  Medications   mirabegron ER (MYRBETRIQ) 25 MG TB24 tablet    Sig: Take 1 tablet (25 mg total) by mouth daily.    Dispense:  30 tablet    Refill:  6   valACYclovir (VALTREX) 1000 MG tablet    Sig: Take 2 tablets (2,000 mg total) by mouth in the morning and at bedtime.    Dispense:  20 tablet    Refill:  1   Orders Placed This Encounter  Procedures   CBC with Differential/Platelet   VITAMIN D 25 Hydroxy (Vit-D Deficiency, Fractures)   Lipid panel   TSH   T4, free   Basic metabolic panel   Magnesium   POCT Urinalysis Dipstick (Automated)   EKG 12-Lead    Patient instructions: Labs today.  For urinary symptoms sounds like mixed incontinence - try myrbetriq '25mg'$  daily. Update [place] with effect. May also do kegel exercises throughout the day. Consider pelvic floor PT vs referral to urology/urogyn for further evaluation/treatment.  For palpitations, EKG looked normal today. Check labs today. Consider cardiology evaluation for holter monitor.  For osteoporosis - get '1200mg'$  calcium/day, continue vitamin D, regular weight bearing exercises. Consider bisphosphonate like fosamax or prolia injections.   Follow up plan: No follow-ups on file.  Ria Bush, MD

## 2022-04-18 NOTE — Assessment & Plan Note (Signed)
She continues following a clean ketogenic diet.  Update labs including lipid panel.

## 2022-04-18 NOTE — Assessment & Plan Note (Addendum)
UA today normal. Describes both stress and urge incontinence symptoms. Discussed Kegel exercises, pelvic floor physical therapy, evaluation by uro or urogyn to discuss pessary. Discussed medication management for urge incontinence and overactive bladder symptoms including antimuscarinics and Myrbetriq.  She prefers to avoid antimuscarinic side effects so we will start Myrbetriq 25 mg daily.  Stay with effect.

## 2022-04-18 NOTE — Patient Instructions (Addendum)
Labs today.  For urinary symptoms sounds like mixed incontinence - try myrbetriq 39m daily. Update u[place]oreawith effect. May also do kegel exercises throughout the day. Consider pelvic floor PT vs referral to urology/urogyn for further evaluation/treatment.  For palpitations, EKG looked normal today. Check labs today. Consider cardiology evaluation for holter monitor.  For osteoporosis - get 12057mcalcium/day, continue vitamin D, regular weight bearing exercises. Consider bisphosphonate like fosamax or prolia injections.   Osteoporosis  Osteoporosis happens when the bones become thin and less dense than normal. Osteoporosis makes bones more brittle and fragile and more likely to break (fracture). Over time, osteoporosis can cause your bones to become so weak that they fracture after a minor fall. Bones in the hip, wrist, and spine are most likely to fracture due to osteoporosis. What are the causes? The exact cause of this condition is not known. What increases the risk? You are more likely to develop this condition if you: Have family members with this condition. Have poor nutrition. Use the following: Steroid medicines, such as prednisone. Anti-seizure medicines. Nicotine or tobacco, such as cigarettes, e-cigarettes, and chewing tobacco. Are female. Are age [age]r older. Are not physically active (are sedentary). Are of European or Asian descent. Have a small body frame. What are the signs or symptoms? A fracture might be the first sign of osteoporosis, especially if the fracture results from a fall or injury that usually would not cause a bone to break. Other signs and symptoms include: Pain in the neck or low back. Stooped posture. Loss of height. How is this diagnosed? This condition may be diagnosed based on: Your medical history. A physical exam. A bone mineral density test, also called a DXA or DEXA test (dual-energy X-ray absorptiometry test). This test uses X-rays to measure the  amount of minerals in your bones. How is this treated? This condition may be treated by: Making lifestyle changes, such as: Including foods with more calcium and vitamin D in your diet. Doing weight-bearing and muscle-strengthening exercises. Stopping tobacco use. Limiting alcohol intake. Taking medicine to slow the process of bone loss or to increase bone density. Taking daily supplements of calcium and vitamin D. Taking hormone replacement medicines, such as estrogen for women and testosterone for men. Monitoring your levels of calcium and vitamin D. The goal of treatment is to strengthen your bones and lower your risk for a fracture. Follow these instructions at home: Eating and drinking Include calcium and vitamin D in your diet. Calcium is important for bone health, and vitamin D helps your body absorb calcium. Good sources of calcium and vitamin D include: Certain fatty fish, such as salmon and tuna. Products that have calcium and vitamin D added to them (are fortified), such as fortified cereals. Egg yolks. Cheese. Liver.  Activity Do exercises as told by your health care provider. Ask your health care provider what exercises and activities are safe for you. You should do: Exercises that make you work against gravity (weight-bearing exercises), such as tai chi, yoga, or walking. Exercises to strengthen muscles, such as lifting weights. Lifestyle Do not drink alcohol if: Your health care provider tells you not to drink. You are pregnant, may be pregnant, or are planning to become pregnant. If you drink alcohol: Limit how much you use to: 0-1 drink a day for women. 0-2 drinks a day for men. Know how much alcohol is in your drink. In the [place] one drink equals one 12 oz bottle of beer (355 mL), one  5 oz glass of wine (148 mL), or one 1 oz glass of hard liquor (44 mL). Do not use any products that contain nicotine or tobacco, such as cigarettes, e-cigarettes, and chewing  tobacco. If you need help quitting, ask your health care provider. Preventing falls Use devices to help you move around (mobility aids) as needed, such as canes, walkers, scooters, or crutches. Keep rooms well-lit and clutter-free. Remove tripping hazards from walkways, including cords and throw rugs. Install grab bars in bathrooms and safety rails on stairs. Use rubber mats in the bathroom and other areas that are often wet or slippery. Wear closed-toe shoes that fit well and support your feet. Wear shoes that have rubber soles or low heels. Review your medicines with your health care provider. Some medicines can cause dizziness or changes in blood pressure, which can increase your risk of falling. General instructions Take over-the-counter and prescription medicines only as told by your health care provider. Keep all follow-up visits. This is important. Contact a health care provider if: You have never been screened for osteoporosis and you are: A woman who is age 26 or older. A man who is age 68 or older. Get help right away if: You fall or injure yourself. Summary Osteoporosis is thinning and loss of density in your bones. This makes bones more brittle and fragile and more likely to break (fracture),even with minor falls. The goal of treatment is to strengthen your bones and lower your risk for a fracture. Include calcium and vitamin D in your diet. Calcium is important for bone health, and vitamin D helps your body absorb calcium. Talk with your health care provider about screening for osteoporosis if you are a woman who is age 65 or older, or a man who is age 107 or older. This information is not intended to replace advice given to you by your health care provider. Make sure you discuss any questions you have with your health care provider. Document Revised: 03/24/2020 Document Reviewed: 03/24/2020 Elsevier Patient Education  [place].  Denosumab injection (Prolia) What is  this medication? DENOSUMAB (den oh sue mab) slows bone breakdown. Prolia is used to treat osteoporosis in women after menopause and in men, and in people who are taking corticosteroids for 6 months or more. Delton See is used to treat a high calcium level due to cancer and to prevent bone fractures and other bone problems caused by multiple myeloma or cancer bone metastases. Delton See is also used to treat giant cell tumor of the bone. This medicine may be used for other purposes; ask your health care provider or pharmacist if you have questions. COMMON BRAND NAME(S): Prolia, XGEVA What should I tell my care team before I take this medication? They need to know if you have any of these conditions: dental disease having surgery or tooth extraction infection kidney disease low levels of calcium or Vitamin D in the blood malnutrition on hemodialysis skin conditions or sensitivity thyroid or parathyroid disease an unusual reaction to denosumab, other medicines, foods, dyes, or preservatives pregnant or trying to get pregnant breast-feeding How should I use this medication? This medicine is for injection under the skin. It is given by a health care professional in a hospital or clinic setting. A special MedGuide will be given to you before each treatment. Be sure to read this information carefully each time. For Prolia, talk to your pediatrician regarding the use of this medicine in children. Special care may be needed. For Xgeva, talk to  your pediatrician regarding the use of this medicine in children. While this drug may be prescribed for children as young as 13 years for selected conditions, precautions do apply. Overdosage: If you think you have taken too much of this medicine contact a poison control center or emergency room at once. NOTE: This medicine is only for you. Do not share this medicine with others. What if I miss a dose? It is important not to miss your dose. Call your doctor or health  care professional if you are unable to keep an appointment. What may interact with this medication? Do not take this medicine with any of the following medications: other medicines containing denosumab This medicine may also interact with the following medications: medicines that lower your chance of fighting infection steroid medicines like prednisone or cortisone This list may not describe all possible interactions. Give your health care provider a list of all the medicines, herbs, non-prescription drugs, or dietary supplements you use. Also tell them if you smoke, drink alcohol, or use illegal drugs. Some items may interact with your medicine. What should I watch for while using this medication? Visit your doctor or health care professional for regular checks on your progress. Your doctor or health care professional may order blood tests and other tests to see how you are doing. Call your doctor or health care professional for advice if you get a fever, chills or sore throat, or other symptoms of a cold or flu. Do not treat yourself. This drug may decrease your body's ability to fight infection. Try to avoid being around people who are sick. You should make sure you get enough calcium and vitamin D while you are taking this medicine, unless your doctor tells you not to. Discuss the foods you eat and the vitamins you take with your health care professional. See your dentist regularly. Brush and floss your teeth as directed. Before you have any dental work done, tell your dentist you are receiving this medicine. Do not become pregnant while taking this medicine or for 5 months after stopping it. Talk with your doctor or health care professional about your birth control options while taking this medicine. Women should inform their doctor if they wish to become pregnant or think they might be pregnant. There is a potential for serious side effects to an unborn child. Talk to your health care professional  or pharmacist for more information. What side effects may I notice from receiving this medication? Side effects that you should report to your doctor or health care professional as soon as possible: allergic reactions like skin rash, itching or hives, swelling of the face, lips, or tongue bone pain breathing problems dizziness jaw pain, especially after dental work redness, blistering, peeling of the skin signs and symptoms of infection like fever or chills; cough; sore throat; pain or trouble passing urine signs of low calcium like fast heartbeat, muscle cramps or muscle pain; pain, tingling, numbness in the hands or feet; seizures unusual bleeding or bruising unusually weak or tired Side effects that usually do not require medical attention (report to your doctor or health care professional if they continue or are bothersome): constipation diarrhea headache joint pain loss of appetite muscle pain runny nose tiredness upset stomach This list may not describe all possible side effects. Call your doctor for medical advice about side effects. You may report side effects to FDA at 1-800-FDA-1088. Where should I keep my medication? This medicine is only given in a clinic, doctor's office, or  other health care setting and will not be stored at home. NOTE: This sheet is a summary. It may not cover all possible information. If you have questions about this medicine, talk to your doctor, pharmacist, or health care provider.  2023 Elsevier/Gold Standard (2018-02-14 00:00:00)  Alendronate Solution (Fosamax) What is this medication? ALENDRONATE (a LEN droe nate) treats osteoporosis. It works by Paramedic stronger and less likely to break (fracture). It belongs to a group of medications called bisphosphonates. This medicine may be used for other purposes; ask your health care provider or pharmacist if you have questions. COMMON BRAND NAME(S): Fosamax What should I tell my care team  before I take this medication? They need to know if you have any of these conditions: Bleeding disorder Cancer Dental disease Difficulty swallowing Infection (fever, chills, cough, sore throat, pain or trouble passing urine) Kidney disease Low levels of calcium or other minerals in the blood Low red blood cell counts Receiving steroids like dexamethasone or prednisone Stomach or intestine problems Trouble sitting or standing for 30 minutes An unusual or allergic reaction to alendronate, other medications, foods, dyes or preservatives Pregnant or trying to get pregnant Breast-feeding How should I use this medication? Take this medication by mouth with a full glass of water. Take it as directed on the prescription label at the same time every day. Use a specially marked oral syringe, spoon, or dropper to measure each dose. Ask your pharmacist if you do not have one. Household spoons are not accurate. Take the dose right after waking up. Do not eat or drink anything before taking it. Do not take it with any other drink except water. After taking it, do not eat breakfast, drink, or take any other medications or vitamins for at least 30 minutes. Sit or stand up for at least 30 minutes after you take it. Do not lie down. Keep taking it unless your care team tells you to stop. A special MedGuide will be given to you by the pharmacist with each prescription and refill. Be sure to read this information carefully each time. Talk to your care team about the use of this medication in children. Special care may be needed. Overdosage: If you think you have taken too much of this medicine contact a poison control center or emergency room at once. NOTE: This medicine is only for you. Do not share this medicine with others. What if I miss a dose? If you take your medication once a day, skip it. Take your next dose at the scheduled time the next morning. Do not take two doses on the same day. If you take your  medication once a week, take the missed dose on the morning after you remember. Do not take two doses on the same day. What may interact with this medication? Aluminum hydroxide Antacids Aspirin Calcium supplements Iron supplements Magnesium supplements Medications for inflammation like ibuprofen, naproxen, and others Vitamins with minerals This list may not describe all possible interactions. Give your health care provider a list of all the medicines, herbs, non-prescription drugs, or dietary supplements you use. Also tell them if you smoke, drink alcohol, or use illegal drugs. Some items may interact with your medicine. What should I watch for while using this medication? Visit your care team for regular checks on your progress. It may be some time before you see the benefit from this medication. Some people who take this medication have severe bone, joint, or muscle pain. This medication may also increase  your risk for jaw problems or a broken thigh bone. Tell your care team right away if you have severe pain in your jaw, bones, joints, or muscles. Tell you care team if you have any pain that does not go away or that gets worse. Tell your dentist and dental surgeon that you are taking this medication. You should not have major dental surgery while on this medication. See your dentist to have a dental exam and fix any dental problems before starting this medication. Take good care of your teeth while on this medication. Make sure you see your dentist for regular follow-up appointments. You should make sure you get enough calcium and vitamin D while you are taking this medication. Discuss the foods you eat and the vitamins you take with your care team. You may need blood work done while you are taking this medication. What side effects may I notice from receiving this medication? Side effects that you should report to your care team as soon as possible: Allergic reactions--skin rash, itching,  hives, swelling of the face, lips, tongue, or throat Low calcium level--muscle pain or cramps, confusion, tingling, or numbness in the hands or feet Osteonecrosis of the jaw--pain, swelling, or redness in the mouth, numbness of the jaw, poor healing after dental work, unusual discharge from the mouth, visible bones in the mouth Pain or trouble swallowing Severe bone, joint, or muscle pain Stomach bleeding--bloody or black, tar-like stools, vomiting blood or brown material that looks like coffee grounds Side effects that usually do not require medical attention (report to your care team if they continue or are bothersome): Constipation Diarrhea Nausea Stomach pain This list may not describe all possible side effects. Call your doctor for medical advice about side effects. You may report side effects to FDA at 1-800-FDA-1088. Where should I keep my medication? Keep out of the reach of children and pets. Store at room temperature between 20 and 25 degrees C (68 and 77 degrees F). Do not freeze. Throw away any unused medication after the expiration date. NOTE: This sheet is a summary. It may not cover all possible information. If you have questions about this medicine, talk to your doctor, pharmacist, or health care provider.  2023 Elsevier/Gold Standard (2020-10-06 00:00:00)

## 2022-04-18 NOTE — Assessment & Plan Note (Signed)
Update thyroid function.  Continue levothyroxine 50 mcg daily.

## 2022-04-18 NOTE — Assessment & Plan Note (Addendum)
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  Discussed anticipate recurrent issue.  Check labs for other contributors. EKG normal today.

## 2022-04-18 NOTE — Assessment & Plan Note (Signed)
Reviewed recent diagnosis as well as treatment options.  Reviewed recommended daily dietary calcium and vitamin D intake.  Recommend regular weightbearing exercise.  Discussed bisphosphonates and Prolia, mechanism of action, common side effects and adverse effects to watch for including atypical wrist fracture and jaw bone use.  Reviewed correct administration of oral bisphosphonate.  Handout provided on osteoporosis, Fosamax and Prolia for patient to investigate.  I asked her to let me know if interested in starting medication.

## 2022-04-18 NOTE — Assessment & Plan Note (Signed)
Update levels on 4000 units daily.

## 2022-04-19 LAB — MAGNESIUM: Magnesium: 2.1 mg/dL (ref 1.6–2.3)

## 2022-04-19 LAB — CBC WITH DIFFERENTIAL/PLATELET
Basophils Absolute: 0.1 10*3/uL (ref 0.0–0.2)
Basos: 1 %
EOS (ABSOLUTE): 0.6 10*3/uL — ABNORMAL HIGH (ref 0.0–0.4)
Eos: 9 %
Hematocrit: 42.2 % (ref 34.0–46.6)
Hemoglobin: 13.9 g/dL (ref 11.1–15.9)
Immature Grans (Abs): 0 10*3/uL (ref 0.0–0.1)
Immature Granulocytes: 0 %
Lymphocytes Absolute: 2.6 10*3/uL (ref 0.7–3.1)
Lymphs: 35 %
MCH: 29.6 pg (ref 26.6–33.0)
MCHC: 32.9 g/dL (ref 31.5–35.7)
MCV: 90 fL (ref 79–97)
Monocytes Absolute: 0.8 10*3/uL (ref 0.1–0.9)
Monocytes: 11 %
Neutrophils Absolute: 3.2 10*3/uL (ref 1.4–7.0)
Neutrophils: 44 %
Platelets: 276 10*3/uL (ref 150–450)
RBC: 4.69 x10E6/uL (ref 3.77–5.28)
RDW: 13.2 % (ref 11.7–15.4)
WBC: 7.3 10*3/uL (ref 3.4–10.8)

## 2022-04-19 LAB — LIPID PANEL
Chol/HDL Ratio: 3.4 ratio (ref 0.0–4.4)
Cholesterol, Total: 196 mg/dL (ref 100–199)
HDL: 58 mg/dL (ref >39)
LDL Chol Calc (NIH): 126 mg/dL — ABNORMAL HIGH (ref 0–99)
Triglycerides: 67 mg/dL (ref 0–149)
VLDL Cholesterol Cal: 12 mg/dL (ref 5–40)

## 2022-04-19 LAB — VITAMIN D 25 HYDROXY (VIT D DEFICIENCY, FRACTURES): Vit D, 25-Hydroxy: 63.7 ng/mL (ref 30.0–100.0)

## 2022-04-19 LAB — BASIC METABOLIC PANEL
BUN/Creatinine Ratio: 20 (ref 9–23)
BUN: 12 mg/dL (ref 6–24)
CO2: 24 mmol/L (ref 20–29)
Calcium: 10.1 mg/dL (ref 8.7–10.2)
Chloride: 102 mmol/L (ref 96–106)
Creatinine, Ser: 0.6 mg/dL (ref 0.57–1.00)
Glucose: 88 mg/dL (ref 70–99)
Potassium: 4.6 mmol/L (ref 3.5–5.2)
Sodium: 142 mmol/L (ref 134–144)
eGFR: 105 mL/min/{1.73_m2} (ref >59)

## 2022-04-19 LAB — TSH: TSH: 2.38 u[IU]/mL (ref 0.450–4.500)

## 2022-04-19 LAB — T4, FREE: Free T4: 1.03 ng/dL (ref 0.82–1.77)

## 2022-04-26 ENCOUNTER — Encounter: Payer: Self-pay | Admitting: Family Medicine

## 2022-08-22 ENCOUNTER — Encounter: Payer: Managed Care, Other (non HMO) | Attending: Psychology | Admitting: Psychology

## 2022-08-22 DIAGNOSIS — F09 Unspecified mental disorder due to known physiological condition: Secondary | ICD-10-CM | POA: Insufficient documentation

## 2022-08-22 DIAGNOSIS — F900 Attention-deficit hyperactivity disorder, predominantly inattentive type: Secondary | ICD-10-CM | POA: Insufficient documentation

## 2022-08-22 DIAGNOSIS — G479 Sleep disorder, unspecified: Secondary | ICD-10-CM | POA: Insufficient documentation

## 2022-08-22 DIAGNOSIS — F411 Generalized anxiety disorder: Secondary | ICD-10-CM | POA: Insufficient documentation

## 2022-08-22 DIAGNOSIS — R5382 Chronic fatigue, unspecified: Secondary | ICD-10-CM | POA: Diagnosis not present

## 2022-08-22 DIAGNOSIS — F410 Panic disorder [episodic paroxysmal anxiety] without agoraphobia: Secondary | ICD-10-CM | POA: Diagnosis not present

## 2022-09-04 ENCOUNTER — Ambulatory Visit: Payer: Managed Care, Other (non HMO) | Admitting: Nurse Practitioner

## 2022-09-04 VITALS — BP 124/82 | HR 78 | Temp 98.4°F | Resp 16 | Ht 65.0 in | Wt 143.4 lb

## 2022-09-04 DIAGNOSIS — R051 Acute cough: Secondary | ICD-10-CM

## 2022-09-04 DIAGNOSIS — R509 Fever, unspecified: Secondary | ICD-10-CM | POA: Diagnosis not present

## 2022-09-04 DIAGNOSIS — J0100 Acute maxillary sinusitis, unspecified: Secondary | ICD-10-CM | POA: Diagnosis not present

## 2022-09-04 DIAGNOSIS — R0609 Other forms of dyspnea: Secondary | ICD-10-CM | POA: Diagnosis not present

## 2022-09-04 MED ORDER — AMOXICILLIN-POT CLAVULANATE 875-125 MG PO TABS: 1.0000 | ORAL_TABLET | Freq: Two times a day (BID) | ORAL | 0 refills | Status: AC

## 2022-09-04 MED ORDER — ALBUTEROL SULFATE HFA 108 (90 BASE) MCG/ACT IN AERS: 2.0000 | INHALATION_SPRAY | Freq: Four times a day (QID) | RESPIRATORY_TRACT | 0 refills | Status: DC | PRN

## 2022-09-04 NOTE — Assessment & Plan Note (Signed)
Albuterol inhaler as needed

## 2022-09-04 NOTE — Progress Notes (Signed)
Acute Office Visit  Subjective:     Patient ID: Susan Soto, female    DOB: 1966/05/17, 56 y.o.   MRN: [id number]  Chief Complaint  Patient presents with   Cough    Started on 08/29/22- chest congestion, runny nose, irritated throat feeling-had sore throat at the beginning, fatigue, headache, SOB, sweats at night. Has taking Mucinnex, homeopathic cold/flu medication OTC. Covid test negative on 09/03/22 at home     Patient is in today for  cough  Symptoms started on 07/29/2022 States that her husband had been sick the week prior. States he had to go to the doctor and get antibiotic States that her phlegm changed color today and it was green  Took a covid test that was negative  Took another one 2 days both negative No covid vaccine Mucinex extra strength 12 hr version. Has also been using homeopathic flu remedy     Review of Systems  Constitutional:  Positive for chills, fever and malaise/fatigue.  HENT:  Positive for sore throat (resolved). Negative for ear discharge, ear pain and sinus pain.   Respiratory:  Positive for cough, sputum production and shortness of breath (doe).   Musculoskeletal:  Positive for joint pain and myalgias (resolved).  Neurological:  Positive for headaches.        Objective:    BP 124/82   Pulse 78   Temp 98.4 F (36.9 C)   Resp 16   Ht '5\' 5"'$  (1.651 m)   Wt 143 lb 6 oz (65 kg)   LMP 03/04/2014   SpO2 97%   BMI 23.86 kg/m    Physical Exam Vitals and nursing note reviewed.  Constitutional:      Appearance: Normal appearance.  HENT:     Right Ear: Tympanic membrane, ear canal and external ear normal.     Left Ear: Tympanic membrane, ear canal and external ear normal.     Nose:     Right Sinus: Maxillary sinus tenderness and frontal sinus tenderness present.     Left Sinus: Maxillary sinus tenderness present.     Mouth/Throat:     Mouth: Mucous membranes are moist.     Pharynx: Oropharynx is clear.  Cardiovascular:     Rate  and Rhythm: Normal rate and regular rhythm.     Heart sounds: Normal heart sounds.  Pulmonary:     Effort: Pulmonary effort is normal.     Breath sounds: Normal breath sounds.  Lymphadenopathy:     Cervical: No cervical adenopathy.  Neurological:     Mental Status: She is alert.     No results found for any visits on 09/04/22.      Assessment & Plan:   Problem List Items Addressed This Visit       Respiratory   Acute non-recurrent maxillary sinusitis    We will start patient on Augmentin 875-125 twice daily for 7 days.  Follow-up if no improvement      Relevant Medications   amoxicillin-clavulanate (AUGMENTIN) 875-125 MG tablet   Other Relevant Orders   COVID-19, Flu A+B and RSV     Other   DOE (dyspnea on exertion)    Albuterol inhaler as needed      Relevant Medications   albuterol (VENTOLIN HFA) 108 (90 Base) MCG/ACT inhaler   Acute cough - Primary    She can continue over-the-counter medications as beneficial.  Offered patient cough medication she politely declined if she changes her mind she will reach out to the office.  Patient requested COVID, flu, RSV testing.  Pending result      Relevant Medications   albuterol (VENTOLIN HFA) 108 (90 Base) MCG/ACT inhaler   Other Relevant Orders   COVID-19, Flu A+B and RSV   Subjective fever    Start antibiotics.  Continue over-the-counter analgesics as needed.  Patient requested to be tested for COVID, flu, RSV.  Pending swab result      Relevant Orders   COVID-19, Flu A+B and RSV    Meds ordered this encounter  Medications   amoxicillin-clavulanate (AUGMENTIN) 875-125 MG tablet    Sig: Take 1 tablet by mouth 2 (two) times daily for 7 days.    Dispense:  14 tablet    Refill:  0    Order Specific Question:   Supervising Provider    Answer:   Loura Pardon A [[id number]]   albuterol (VENTOLIN HFA) 108 (90 Base) MCG/ACT inhaler    Sig: Inhale 2 puffs into the lungs every 6 (six) hours as needed for wheezing or  shortness of breath.    Dispense:  8 g    Refill:  0    Order Specific Question:   Supervising Provider    Answer:   TOWER, MARNE A [[id number]]    Return if symptoms worsen or fail to improve.  Romilda Garret, NP

## 2022-09-04 NOTE — Assessment & Plan Note (Signed)
We will start patient on Augmentin 875-125 twice daily for 7 days.  Follow-up if no improvement

## 2022-09-04 NOTE — Patient Instructions (Addendum)
Nice to see you today I will be in touch with the swab results once I have them  Follow up if you do not improve   I recommend that if you go out to wear a mask when in public and around other people

## 2022-09-04 NOTE — Assessment & Plan Note (Signed)
Start antibiotics.  Continue over-the-counter analgesics as needed.  Patient requested to be tested for COVID, flu, RSV.  Pending swab result

## 2022-09-04 NOTE — Assessment & Plan Note (Signed)
She can continue over-the-counter medications as beneficial.  Offered patient cough medication she politely declined if she changes her mind she will reach out to the office.  Patient requested COVID, flu, RSV testing.  Pending result

## 2022-09-06 LAB — COVID-19, FLU A+B AND RSV
Influenza A, NAA: NOT DETECTED
Influenza B, NAA: NOT DETECTED
RSV, NAA: NOT DETECTED
SARS-CoV-2, NAA: NOT DETECTED

## 2022-09-16 ENCOUNTER — Encounter: Payer: Self-pay | Admitting: Psychology

## 2022-09-16 NOTE — Progress Notes (Signed)
Neuropsychology Visit  Patient:  Susan Soto   DOB: 1966-03-05  MR Number: [id number]  Location: [facility] [place], [place] [id number] [place] [id number] Dept: [phone]  Date of Service: 02/27/2022  Start: 3 PM End: 4 PM  Duration of Service: 1 Hour  Today's visit was an in person visit that was conducted in my outpatient clinic office with the patient myself present.  Provider/Observer:     Edgardo Roys PsyD  Chief Complaint:      Chief Complaint  Patient presents with   Anxiety   Depression   ADHD   Seizures   Sleeping Problem   Panic Attack    Reason For Service:     Susan Soto is a 56 year old female referred by Daymon Larsen, MD with [facility].  The patient has a history of sleep disturbance with reports of cognitive disturbance and bilateral carpal tunnel syndrome.  The patient has been out of work after taking a medical leave in 2017 but then returning to work in a different job.  The patient has a history of major depressive disorder, adult residual attention deficit disorder and potentially residual cognitive difficulties after being diagnosed and treated for breast cancer in 2009 with both chemotherapy and radiation.  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   Patient reports that she was diagnosed with inattentive type attention deficit disorder in her early 24s and always had significant weakness keeping up with paperwork and documentation with her job.  The patient works as a Education officer, [employer].  The patient reports that she also had significant issues with major depressive disorder.  She reports that she took a medical leave of absence in 2017 after developing significant exacerbation  of her major depression with her mother passing away.  The patient returned to work after couple of months with an opportunity for a different job that had less demands and lower caseload.  Patient reports that she continued to have difficulty keeping up with any kind of paperwork and her previous strategies for dealing with that were not very helpful.  The patient reports that if she is not taking any medications that she just stays in bed, has no energy and significant difficulty with motivation.  The patient reports that this was true even when she was a young child.   The patient reports that she had an exacerbation of her difficulties after her chemotherapy treatment for breast cancer.  The patient reports that initially she experienced "chemo brain" but got better and then in 2017 her symptoms returned and she deteriorated.  The patient reports that typically she would have times of "hyperfocus" to get caught up now she cannot "hyperfocus" and has difficulty keeping up with paperwork.  The patient reports that she made improvements while out on FMLA after her mother and close aunt died.   The patient reports that she has seen a psychiatrist for 20 years in [place].   Patient reports being extremely stressed at work and feeling like she is extremely behind on her caseload and paperwork.  Her job has allowed her to start coming in at 10 AM to help her get better sleep.  Even with this adjustment the patient has difficulty getting into work before 11 AM.  The patient reports that she cant  get her thoughts focused enough to do anything even at home.  She is trying to work long enough to retire early.    The patient is not sleeping well but no significant findings on sleep study beyond delayed REM onset.  The patient had been on extreme low fat diet for many years but has stopped this diet.     The patient now has sudden onset of anxiety and panic attacks.     03/28/20 Update:  The patient returned for  visit and brought the paperwork that she had forgotten in Feb.  The patient described a a decline in memory and executive functioning along with increasing anxiety and worsening of ADD/attention symptoms.  She describes new onset panic attacks within the past 6 months.  "pins and needles and pain in fingers described".  She describes a decline in her ability to perform tasks that involve concentration.  She continues to use Dexedrine for attention along with valium 1-2 times per day.  The patient had been out on FMLA due to major depresion and new onset of anxiety.  FMLA has run out and the patient has had to stop working but her short-term disability has denied her so far, attributing it to a pre-existing condition according to patient.  The patient describes severe insomnia (not OSA) but has had abnormalities on sleep study related to reduction in REM sleep.    04/11/2020 update: The patient return for a visit today and reports that she has been having some improvements in her sleep pattern and there has been some improvements in her fatigue during the day.  She has reduced how much stimulants she is taking as well as taking less Valium.  The patient reports that she continues to have significant difficulties getting going in the morning but she has been going to bed earlier at night and getting up in the morning sooner and on many days she is able to get up at sunrise.  She has been following sleep hygiene efforts.  05/26/2020: The patient reports that she is continued to have some improvements but continues with significant attentional issues and disturbed sleep.  The patient is continued with the lower dose of psychostimulant medications but this has a negative impact on her attention and concentration but does seem to help with her anxiety symptoms.  The patient has continued to work on sleep hygiene issues and has improved somewhat and will be following up with her neurologist soon.  06/20/2020: The patient  reports that she has continued to make improvements particular around her sleep functioning.  The patient reports that she went to the beach to spend some time as well as visit her friend and was away from her husband another household stressors.  She actively worked on sleep hygiene issues and focused on very fundamental behavioral consistencies.  She reports that she did much better as far as her anxiety and stress.  There continues to be a lot of frustration and stressors associated with her husband's activities and behaviors and the patient is working on more focus on taking care of herself rather than trying to fix all intervene with his issues that he himself is not acknowledging or actively trying to address.  09/08/2020: The patient missed her last appointment as she had documented the time in a confusing way.  It was an 8:00 appointment and she typically has great difficulty getting herself organized to these appointments and miss documented in her calendar and was going to be too late  for the appointment before she realized this discrepancy.  The patient reports that in someway she is done better and has been actively working on a number of things and has made more accomplishments in this period of time then she typically gives herself credit for.  The patient is done very well working on foundational issues and has been improving her behavioral aspects of sleep hygiene, paying close attention all to good nutrition and physical action and activity.  The patient reports that she continues to have difficulty maintaining attention and focus and there have been efforts to adjust some of her medications for optimization.  The patient reports that there continue to be significant stressors with her husband and her husband's psychiatric issues of severe OCD including hoarding type behaviors.  10/06/2020: The patient reports that she has continued to have significant issues of cognitive difficulties and  fatigue during the day.  Patient continues to have sleep disturbance and we talked about some very practical issues around sleep hygiene including sleeping in her room without pets that she reports will wake her up at various times at night.  Patient continues to have significant pain continued severe sleep disturbance but has improved sleep hygiene and sleep pattern significantly since initially being here.  11/10/2020: The patient reports that she has been off track with regard to her sleep hygiene regimen and has noticed a worsening of her cognitive difficulties including attentional issues, anxiety stress, executive function etc. when she does not follow her sleep hygiene regimen is closely as she had been.  The patient reports that there have been medical complications recently with both her and her husband developing symptomatic COVID-19 infection with the patient having more than a week of illness.  During the time her husband was sick he did completely stop drinking which she was hopeful would lead to his continued cessation of alcohol use.  However, as soon as he felt well enough to go back to longstanding habits he did return to his alcohol use and consumption but initially restricted it to drinking beer but is now returned to his previous multi sources of alcohol use.  The patient reports that she has gotten better about being stressed over the impacts of his alcohol abuse realizing that there is little to nothing that she can do to manage or control his activities.  The patient is focused on her wellbeing overall and is learned a number of viable coping skills and implemented those to help her symptoms.  However, she continues to have significant attentional deficits and other cognitive deficits.  01/05/2021: The patient appeared to be in some of the best status she has been since I started seeing her.  Her anxiety was down and she had good cognitive and mental status.  The patient's mood was better  and she reports that she has been sleeping better although recently she has not been doing all of the sleep hygiene that she had done to affect improved sleep patterns.  Psychosocial stressors have been a little bit better with her husband returning to work in his office during the day which gives her a break from being around him 24/7.  Her husband continues to consume significant amounts of alcohol which limit their healthy interactions.  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  The patient reports that things have continued to be very similar at home although her husband has gone back to work.  The patient also house that for a friend for a weekend that also went very well.  The patient reports that she has had times where her anxiety seems to exacerbate but overall she continues to do better.  04/26/2021: The patient reports that her uncle is moved into the office structure that they built behind their house that was originally built to house her husband's home office that was not being used.  She reports that initially her husband was resistant to this but decided it was okay.  She reports that overall this is turned out well and it is helped motivate her in some aspects.  The patient continues to struggle with completing task but was able to help get the structure ready for her uncle.  He has not overextended himself into their day-to-day lives and it does appear to be going fairly well.  The patient reports that her pain continues as well as stress of depression but she has been having fewer panic attacks.  09/11/2021: The patient reports that things have been changing around her house significantly lately.  The patient reports that her and  her husband have decided to look at selling their house and they have had to be doing a great deal of things around the house or getting ready to to prepare it for sale.  The plan is to move into a smaller home but her husband's compulsive buying and hoarding of types of clothes etc. are having to be changed and fixed.  There is plan to do some remodeling in the house which are all can fall on her shoulders.  However, she is looking forward to making some significant changes which will help her overall.  We continue to work on therapeutic interventions around her cognitive issues and depression and anxiety.  11/21/2021: The patient reports that there have been some stressors between her and her husband recently that have been more than typical.  He continues to abuse alcohol but after this being specifically called out by their son who is now living with them he has made some attempts at reducing the amount of alcohol he is consuming.  His strategy has been to switch from distilled liquor to beer.  While this is produced some changes and reduced the level of his intoxication it is only primarily been done during the week and on weekends he goes back to consuming larger amounts of distilled alcohol.  The patient is very frustrated by his unwillingness to transfer money into her account so she could pay her insurance bills while at the same time continuing his compulsive buying habits and hoarding behaviors etc.  The patient is, however, actively working on trying to take care of herself first and feels like she is making some personal gains and doing better overall.  She is actively working on foundational issues such as diet, sleep and physical activity.  She continues to have attentional issues and other cognitive issues secondary to significant attention and concentration deficits.  Patient reports an improvement in her depression and anxiety symptoms.  01/11/2022: It has been several months since I saw the  patient last and she reports that some of the stressors around her house have been better with her husband drinking less alcohol and her working on some of  the issues related to her panic events and anxiety.  However, she has had some worsening of some of her attentional difficulties fatigue and motivation.  There have been difficulties with medications particularly stimulant type medications due to national shortage of these medications and some attempts at changing medications.  The patient reports that this is left her with extended days to weeks of time without medications.  There was an attempt to switch to Focalin at 1 point and while she felt it was initially helpful she began having significant changes in her mood state and worsening depression.  As she ran out of this medicine relatively quickly her depressive symptoms improved.  They are still working on trying to adjust and acquire psychostimulant medications.  Today we continue to work on therapeutic interventions around her panic disorders, attentional issues and neurocognitive difficulties.  The patient's attention and concentration and memory along with executive functioning continue to be quite impaired.  Sleep continues to be disturbed.  02/27/2022: Following up with the patient she continues to have a lot of stressors around psychosocial issues.  Her attention and cognitive difficulties and ongoing issues with functional neurological issues remain quite problematic.  The patient has actively been working on therapeutic interventions and reports that overall she is improving but there continues to be a lot of psychosocial stressors with her husband's alcohol use in particular.  We continue to work on therapeutic interventions around issues related to her panic disorder and attentional issues and other neurocognitive difficulties.  Treatment Interventions:  Cognitive Behavioral and coping strategies along with continued history.  Participation  Level:   Active  Participation Quality:  Appropriate and Redirectable      Behavioral Observation:  Well Groomed, Alert, and Appropriate.   Current Psychosocial Factors: Patient reports that they are still significant struggles with psychosocial stressors.  Patient and her husband have been offering on a house and have their house up for sale and have done a significant amount of work since I saw her last.  The patient is still struggling with her husband's alcoholism and how that leads her doing most of the day today work.  Content of Session:   Reviewed current symptoms and worked on coping and adjustment issues.    Effectiveness of Interventions: Patient is very motivated to improve and working hard on some of the challenging behavioral changes we have done around sleep hygiene.    Target Goals:   Improve sleep, work on anxiety and depression along with cognitive deficits.  Goals Last Reviewed:   08/21/2022  Goals Addressed Today:    Continued with sleep hygiene issues and concerns around cognitive deficits and coping.  Impression/Diagnosis:   Susan Soto is a 56 year old female referred by Daymon Larsen, MD with [facility].  The patient has a history of sleep disturbance with reports of cognitive disturbance and bilateral carpal tunnel syndrome.  The patient has been out of work after taking a medical leave in 2017 but then returning to work in a different job.  The patient has a history of major depressive disorder, adult residual attention deficit disorder and potentially residual cognitive difficulties after being diagnosed and treated for breast cancer in 2009 with both chemotherapy and radiation.  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  The patient continues with significant anxiety, pain and sleep disturbance.  The  patient has been actively working on  therapeutic interventions today we worked on some very practical strategies for aiding her free recall of information and strategies for completing tasks.  10/06/2020: The patient reports that she has continued to have significant issues of cognitive difficulties and fatigue during the day.  Patient continues to have sleep disturbance and we talked about some very practical issues around sleep hygiene including sleeping in her room without pets that she reports will wake her up at various times at night.  Patient continues to have significant pain continued severe sleep disturbance but has improved sleep hygiene and sleep pattern significantly since initially being here.  11/10/2020: The patient reports that she has been off track with regard to her sleep hygiene regimen and has noticed a worsening of her cognitive difficulties including attentional issues, anxiety stress, executive function etc. when she does not follow her sleep hygiene regimen is closely as she had been.  The patient reports that there have been medical complications recently with both her and her husband developing symptomatic COVID-19 infection with the patient having more than a week of illness.  During the time her husband was sick he did completely stop drinking which she was hopeful would lead to his continued cessation of alcohol use.  However, as soon as he felt well enough to go back to longstanding habits he did return to his alcohol use and consumption but initially restricted it to drinking beer but is now returned to his previous multi sources of alcohol use.  The patient reports that she has gotten better about being stressed over the impacts of his alcohol abuse realizing that there is little to nothing that she can do to manage or control his activities.  The patient is focused on her wellbeing overall and is learned a number of viable coping skills and implemented those to help her  symptoms.  However, she continues to have significant attentional deficits and other cognitive deficits.  01/05/2021: The patient appeared to be in some of the best status she has been since I started seeing her.  Her anxiety was down and she had good cognitive and mental status.  The patient's mood was better and she reports that she has been sleeping better although recently she has not been doing all of the sleep hygiene that she had done to affect improved sleep patterns.  Psychosocial stressors have been a little bit better with her husband returning to work in his office during the day which gives her a break from being around him 24/7.  Her husband continues to consume significant amounts of alcohol which limit their healthy interactions.  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  The patient reports that things have continued to be very similar at home although her husband has gone back to work.  The patient also house that for a friend for a weekend that also went very well.  The patient reports that she has had times where her anxiety seems to exacerbate but overall she continues to do better.  04/26/2021: The patient reports that her uncle is moved into the office structure that they built behind their house that was originally built to house her husband's home office that was not being used.  She reports that initially her husband was resistant to this but decided it was okay.  She reports that overall this is turned out well and it is helped motivate her in some aspects.  The patient continues to struggle with completing task but was able to help get the structure ready for her uncle.  He has not overextended himself into their day-to-day  lives and it does appear to be going fairly well.  The patient reports that her pain continues as well as stress of depression but she has been having fewer panic attacks.  09/11/2021: The patient reports that things have been changing around her house significantly lately.  The patient reports that her and her husband have decided to look at selling their house and they have had to be doing a great deal of things around the house or getting ready to to prepare it for sale.  The plan is to move into a smaller home but her husband's compulsive buying and hoarding of types of clothes etc. are having to be changed and fixed.  There is plan to do some remodeling in the house which are all can fall on her shoulders.  However, she is looking forward to making some significant changes which will help her overall.  We continue to work on therapeutic interventions around her cognitive issues and depression and anxiety.  11/21/2021: The patient reports that there have been some stressors between her and her husband recently that have been more than typical.  He continues to abuse alcohol but after this being specifically called out by their son who is now living with them he has made some attempts at reducing the amount of alcohol he is consuming.  His strategy has been to switch from distilled liquor to beer.  While this is produced some changes and reduced the level of his intoxication it is only primarily been done during the week and on weekends he goes back to consuming larger amounts of distilled alcohol.  The patient is very frustrated by his unwillingness to transfer money into her account so she could pay her insurance bills while at the same time continuing his compulsive buying habits and hoarding behaviors etc.  The patient is, however, actively working on trying to take care of herself first and feels like she is making some personal gains and doing better overall.  She is actively working on foundational  issues such as diet, sleep and physical activity.  She continues to have attentional issues and other cognitive issues secondary to significant attention and concentration deficits.  Patient reports an improvement in her depression and anxiety symptoms.  02/27/2022: Following up with the patient she continues to have a lot of stressors around psychosocial issues.  Her attention and cognitive difficulties and ongoing issues with functional neurological issues remain quite problematic.  The patient has actively been working on therapeutic interventions and reports that overall she is improving but there continues to be a lot of psychosocial stressors with her husband's alcohol use in particular.  We continue to work on therapeutic interventions around issues related to her panic disorder and attentional issues and other neurocognitive difficulties.  Diagnosis:   Cognitive and neurobehavioral dysfunction  Chronic fatigue  Sleep disturbance  Panic disorder  Generalized anxiety disorder  Attention deficit hyperactivity disorder (ADHD), predominantly inattentive type    Ilean Skill, Psy.D. Clinical Psychologist Neuropsychologist

## 2022-09-24 ENCOUNTER — Encounter: Payer: Managed Care, Other (non HMO) | Admitting: Psychology

## 2022-12-08 ENCOUNTER — Other Ambulatory Visit: Payer: Self-pay | Admitting: Family Medicine

## 2022-12-08 DIAGNOSIS — E038 Other specified hypothyroidism: Secondary | ICD-10-CM

## 2022-12-08 DIAGNOSIS — E785 Hyperlipidemia, unspecified: Secondary | ICD-10-CM

## 2022-12-08 DIAGNOSIS — R5382 Chronic fatigue, unspecified: Secondary | ICD-10-CM

## 2022-12-08 DIAGNOSIS — E559 Vitamin D deficiency, unspecified: Secondary | ICD-10-CM

## 2022-12-08 NOTE — Addendum Note (Signed)
Addended by: Ria Bush on: 12/08/2022 12:21 PM   Modules accepted: Orders

## 2022-12-10 ENCOUNTER — Other Ambulatory Visit: Payer: Managed Care, Other (non HMO)

## 2022-12-10 ENCOUNTER — Other Ambulatory Visit (INDEPENDENT_AMBULATORY_CARE_PROVIDER_SITE_OTHER): Payer: Managed Care, Other (non HMO)

## 2022-12-10 DIAGNOSIS — E559 Vitamin D deficiency, unspecified: Secondary | ICD-10-CM

## 2022-12-10 DIAGNOSIS — E785 Hyperlipidemia, unspecified: Secondary | ICD-10-CM

## 2022-12-10 DIAGNOSIS — E038 Other specified hypothyroidism: Secondary | ICD-10-CM | POA: Diagnosis not present

## 2022-12-10 DIAGNOSIS — R5382 Chronic fatigue, unspecified: Secondary | ICD-10-CM

## 2022-12-10 NOTE — Addendum Note (Signed)
Addended by: Ellamae Sia on: 12/10/2022 09:30 AM   Modules accepted: Orders

## 2022-12-12 LAB — CBC WITH DIFFERENTIAL/PLATELET
Basophils Absolute: 0 10*3/uL (ref 0.0–0.2)
Basos: 1 %
EOS (ABSOLUTE): 0.1 10*3/uL (ref 0.0–0.4)
Eos: 2 %
Hematocrit: 41.3 % (ref 34.0–46.6)
Hemoglobin: 13.1 g/dL (ref 11.1–15.9)
Immature Grans (Abs): 0 10*3/uL (ref 0.0–0.1)
Immature Granulocytes: 0 %
Lymphocytes Absolute: 2.7 10*3/uL (ref 0.7–3.1)
Lymphs: 46 %
MCH: 28.9 pg (ref 26.6–33.0)
MCHC: 31.7 g/dL (ref 31.5–35.7)
MCV: 91 fL (ref 79–97)
Monocytes Absolute: 0.7 10*3/uL (ref 0.1–0.9)
Monocytes: 12 %
Neutrophils Absolute: 2.3 10*3/uL (ref 1.4–7.0)
Neutrophils: 39 %
Platelets: 275 10*3/uL (ref 150–450)
RBC: 4.53 x10E6/uL (ref 3.77–5.28)
RDW: 13.1 % (ref 11.7–15.4)
WBC: 5.8 10*3/uL (ref 3.4–10.8)

## 2022-12-12 LAB — LIPID PANEL
Chol/HDL Ratio: 3 ratio (ref 0.0–4.4)
Cholesterol, Total: 199 mg/dL (ref 100–199)
HDL: 67 mg/dL (ref >39)
LDL Chol Calc (NIH): 123 mg/dL — ABNORMAL HIGH (ref 0–99)
Triglycerides: 49 mg/dL (ref 0–149)
VLDL Cholesterol Cal: 9 mg/dL (ref 5–40)

## 2022-12-12 LAB — COMPREHENSIVE METABOLIC PANEL
ALT: 13 IU/L (ref 0–32)
AST: 15 IU/L (ref 0–40)
Albumin/Globulin Ratio: 2.5 — ABNORMAL HIGH (ref 1.2–2.2)
Albumin: 4.7 g/dL (ref 3.8–4.9)
Alkaline Phosphatase: 47 IU/L (ref 44–121)
BUN/Creatinine Ratio: 22 (ref 9–23)
BUN: 14 mg/dL (ref 6–24)
Bilirubin Total: 0.3 mg/dL (ref 0.0–1.2)
CO2: 22 mmol/L (ref 20–29)
Calcium: 9.5 mg/dL (ref 8.7–10.2)
Chloride: 103 mmol/L (ref 96–106)
Creatinine, Ser: 0.65 mg/dL (ref 0.57–1.00)
Globulin, Total: 1.9 g/dL (ref 1.5–4.5)
Glucose: 96 mg/dL (ref 70–99)
Potassium: 4.5 mmol/L (ref 3.5–5.2)
Sodium: 139 mmol/L (ref 134–144)
Total Protein: 6.6 g/dL (ref 6.0–8.5)
eGFR: 103 mL/min/{1.73_m2} (ref >59)

## 2022-12-12 LAB — T3: T3, Total: 86 ng/dL (ref 71–180)

## 2022-12-12 LAB — SEDIMENTATION RATE: Sed Rate: 2 mm/h (ref 0–40)

## 2022-12-12 LAB — TSH: TSH: 2.6 u[IU]/mL (ref 0.450–4.500)

## 2022-12-12 LAB — VITAMIN D 25 HYDROXY (VIT D DEFICIENCY, FRACTURES): Vit D, 25-Hydroxy: 34.1 ng/mL (ref 30.0–100.0)

## 2022-12-12 LAB — T4, FREE: Free T4: 1.24 ng/dL (ref 0.82–1.77)

## 2022-12-12 LAB — VITAMIN B12: Vitamin B-12: 571 pg/mL (ref 232–1245)

## 2022-12-17 ENCOUNTER — Ambulatory Visit: Payer: Managed Care, Other (non HMO) | Admitting: Family Medicine

## 2022-12-17 ENCOUNTER — Other Ambulatory Visit: Payer: Self-pay | Admitting: Family Medicine

## 2022-12-17 ENCOUNTER — Encounter: Payer: Self-pay | Admitting: Family Medicine

## 2022-12-17 ENCOUNTER — Other Ambulatory Visit (HOSPITAL_COMMUNITY)
Admission: RE | Admit: 2022-12-17 | Discharge: 2022-12-17 | Disposition: A | Discharge status: Home / Self Care | Payer: Managed Care, Other (non HMO) | Source: Ambulatory Visit | Attending: Family Medicine | Admitting: Family Medicine

## 2022-12-17 VITALS — BP 138/76 | HR 96 | Temp 97.2°F | Ht 65.25 in | Wt 136.4 lb

## 2022-12-17 DIAGNOSIS — R739 Hyperglycemia, unspecified: Secondary | ICD-10-CM

## 2022-12-17 DIAGNOSIS — E038 Other specified hypothyroidism: Secondary | ICD-10-CM

## 2022-12-17 DIAGNOSIS — E041 Nontoxic single thyroid nodule: Secondary | ICD-10-CM

## 2022-12-17 DIAGNOSIS — Z0000 Encounter for general adult medical examination without abnormal findings: Secondary | ICD-10-CM | POA: Diagnosis not present

## 2022-12-17 DIAGNOSIS — N3946 Mixed incontinence: Secondary | ICD-10-CM

## 2022-12-17 DIAGNOSIS — Z01419 Encounter for gynecological examination (general) (routine) without abnormal findings: Secondary | ICD-10-CM

## 2022-12-17 DIAGNOSIS — F900 Attention-deficit hyperactivity disorder, predominantly inattentive type: Secondary | ICD-10-CM | POA: Diagnosis not present

## 2022-12-17 DIAGNOSIS — M810 Age-related osteoporosis without current pathological fracture: Secondary | ICD-10-CM

## 2022-12-17 DIAGNOSIS — Z853 Personal history of malignant neoplasm of breast: Secondary | ICD-10-CM

## 2022-12-17 DIAGNOSIS — F331 Major depressive disorder, recurrent, moderate: Secondary | ICD-10-CM | POA: Diagnosis not present

## 2022-12-17 DIAGNOSIS — Z789 Other specified health status: Secondary | ICD-10-CM

## 2022-12-17 DIAGNOSIS — E559 Vitamin D deficiency, unspecified: Secondary | ICD-10-CM

## 2022-12-17 DIAGNOSIS — Z1231 Encounter for screening mammogram for malignant neoplasm of breast: Secondary | ICD-10-CM

## 2022-12-17 DIAGNOSIS — R634 Abnormal weight loss: Secondary | ICD-10-CM | POA: Insufficient documentation

## 2022-12-17 DIAGNOSIS — R5382 Chronic fatigue, unspecified: Secondary | ICD-10-CM

## 2022-12-17 LAB — POC URINALSYSI DIPSTICK (AUTOMATED)
Bilirubin, UA: NEGATIVE
Blood, UA: NEGATIVE
Glucose, UA: NEGATIVE
Leukocytes, UA: NEGATIVE
Nitrite, UA: NEGATIVE
Protein, UA: NEGATIVE
Spec Grav, UA: 1.015 (ref 1.010–1.025)
Urobilinogen, UA: 0.2 E.U./dL
pH, UA: 7 (ref 5.0–8.0)

## 2022-12-17 MED ORDER — THYROID 30 MG PO TABS: 30.0000 mg | ORAL_TABLET | Freq: Every day | ORAL | 6 refills | Status: DC

## 2022-12-17 NOTE — Assessment & Plan Note (Signed)
Preventative protocols reviewed and updated unless pt declined. Discussed healthy diet and lifestyle.  

## 2022-12-17 NOTE — Assessment & Plan Note (Signed)
She desires to try armour thyroid in place of levothyroxine.  Reviewed this is desiccated pig thyroid extract and not optimal ratio for humans.  She wants to try nonetheless.  Will Rx armour thyroid '30mg'$  daily. Recheck TFTs 3 months, update with effect.

## 2022-12-17 NOTE — Assessment & Plan Note (Signed)
Followed by psych

## 2022-12-17 NOTE — Assessment & Plan Note (Addendum)
Followed by psychiatrist

## 2022-12-17 NOTE — Assessment & Plan Note (Signed)
Last thyroid US stable 09/2021 - rec rpt 09/2023 for final 5 yr thyroid US to document stability.

## 2022-12-17 NOTE — Assessment & Plan Note (Signed)
Update ferritin.

## 2022-12-17 NOTE — Patient Instructions (Addendum)
Labs today  Urinalysis today Call [facility] to schedule mammogram [phone] Pap smear today  Goal '1200mg'$  calcium/day. Try to get most or all of your calcium from your food To figure out dietary calcium: 300 mg/day from all non dairy foods plus 300 mg per cup of milk, other dairy, or fortified juice. Non dairy foods that contain calcium: Kale, oranges, sardines, oatmeal, soy milk/soybeans, salmon, white beans, dried figs, turnip greens, almonds, broccoli, tofu.   May try armour thyroid for a few months, update [place] with effect. Schedule lab visit in 2-3 months to recheck thyroid levels.   Return in 6 months for follow up visit

## 2022-12-17 NOTE — Assessment & Plan Note (Signed)
Discussed recent DEXA report - reviewed dietary calcium recommendations, rec restart vit D supplementation.  Will provide handout on prolia (denosumab) and fosamax (alendronate) for pt to review.  Discussed risk of atypical hip fracture. She will discuss with dentist.

## 2022-12-17 NOTE — Assessment & Plan Note (Signed)
Levels low normal - she's ben off vit D 4000 IU daily - will restart this.

## 2022-12-17 NOTE — Assessment & Plan Note (Signed)
Significant intentional 40 lb weight loss over the past year - pt attributes to following low carb diet. Recent labwork reassuring. Encouraged she complete overdue preventative health maintenance including mammogram.

## 2022-12-17 NOTE — Assessment & Plan Note (Signed)
H/o L breast cancer 2009 s/p wide excision and whole breast radiation  Surgeon has retired. She needs to schedule mammogram as overdue - # provided.

## 2022-12-17 NOTE — Assessment & Plan Note (Signed)
Update UA today - normal.

## 2022-12-17 NOTE — Progress Notes (Signed)
Patient ID: Susan Soto, female    DOB: 1965/11/17, 56 y.o.   MRN: [id number]  This visit was conducted in person.  BP 138/76   Pulse 96   Temp (!) 97.2 F (36.2 C) (Temporal)   Ht 5' 5.25" (1.657 m)   Wt 136 lb 6 oz (61.9 kg)   LMP 03/04/2014   SpO2 99%   BMI 22.52 kg/m    CC: CPE Subjective:   HPI: Susan Soto is a 57 y.o. female presenting on 12/17/2022 for Annual Exam   Sees psychiatrist (Dr Tyler Deis) and Dr Sima Matas neuropsychologist for cognitive and neurobehavioral dysfunction, circadian rhythm sleep disorder, chronic fatigue, hypothyroidism, MDD, GAD with panic disorder and ADHD. Released from sleep doctor (Dohmeier). Planning to establish with therapist.   40 lbs down in the past year! - attributes to low carb diet, she has been trying to lose weight. She is off refined sugar, limiting gluten in diet.   She feels levothyroxine 47mg is not as effective - would like to try armour thyroid.    Preventative: COLONOSCOPY WITH PROPOFOL 10/06/2019 - WNL, rpt 10 yrs ([facility] - mother dx with cervical cancer 434s Pap smear normal 08/2018, 09/2020. Benign endometrial biopsy for post menopausal bleeding ~2018. Now checking Q2 yrs.  Mammogram 09/2021 Birads1 @ [facility]. H/o L breast cancer s/p radiation and chemotherapy 2009 - yearly mammograms, sees surgery yearly. Dr BBary Castillais retiring. Per his latest note: "stable breast exam now 13 years post wide excision and whole breast radiation. Routine annual screening mammograms with PCP is appropriate."  LMP - 02/2014. Postmenopausal at age 89yo  DEXA 03/2022 - T -2.6 L femur neck. Continues vit D 4000 IU daily. Discussed calcium in diet.  Lung cancer screening - not eligible  Flu yearly - declines this year COVID vaccine - discussed, declines  Td 2003, Tdap 2013, 11/2021 Shingrix - 09/2019, 11/2019  Seat belt use discussed.  Sunscreen use discussed.  No changing moles.  Non smoker.  Alcohol - none.   Dentist q6 mo  Eye exam - yearly   Caffeine: 1 cup coffee in am  Lives with husband and 1 son, daughter in college, 1 dog and 1 cat, 5-6 outside cats  Occupation: sCamera operatorjob  Activity: no regular exercise Diet: joined [employer] good water, fruits/vegetables daily, red meat 3x/wk, fish 2x/wk, more stevia and less regular sugar. KetoDiet since 10/2021.      Relevant past medical, surgical, family and social history reviewed and updated as indicated. Interim medical history since our last visit reviewed. Allergies and medications reviewed and updated. Outpatient Medications Prior to Visit  Medication Sig Dispense Refill   Ascorbic Acid (VITAMIN C) POWD Take 1,000 mg by mouth daily.     ASHWAGANDHA PO Take 375 mg by mouth at bedtime.     B Complex Vitamins (VITAMIN B COMPLEX PO) Take by mouth daily.     Cholecalciferol 100 MCG (4000 UT) CAPS Take 1 capsule (4,000 Units total) by mouth daily.     COLLAGEN PO Take 1 tablet by mouth daily.     diazepam (VALIUM) 5 MG tablet Take 5 mg by mouth at bedtime.     Magnesium 250 MG TABS Take 1 tablet (250 mg total) by mouth daily.     medium chain triglycerides (MCT OIL) oil Take by mouth 3 (three) times daily. Takes 3 TBSP daily     Multiple Vitamins-Minerals (ANTIOXIDANT FORMULA SG) capsule Take 1 capsule by  mouth daily. Powder daily     NON FORMULARY Vitamins ADEK     Omega-3 Fatty Acids (OMEGA 3 PO) Take 690 mg by mouth daily. Nordic naturals     OVER THE COUNTER MEDICATION 2 capsules daily. KETO- metabolic ketosis support     Probiotic Product (PROBIOTIC DAILY PO) Take by mouth daily.     TURMERIC PO Take 1 tablet by mouth daily.     valACYclovir (VALTREX) 1000 MG tablet Take 2 tablets (2,000 mg total) by mouth in the morning and at bedtime. 20 tablet 1   ZENZEDI 20 MG TABS Take 1 tablet by mouth every morning.     levothyroxine (SYNTHROID) 50 MCG tablet Take 1 tablet (50 mcg total) by mouth daily. 90 tablet 3   albuterol  (VENTOLIN HFA) 108 (90 Base) MCG/ACT inhaler Inhale 2 puffs into the lungs every 6 (six) hours as needed for wheezing or shortness of breath. 8 g 0   mirabegron ER (MYRBETRIQ) 25 MG TB24 tablet Take 1 tablet (25 mg total) by mouth daily. (Patient not taking: Reported on 09/04/2022) 30 tablet 6   No facility-administered medications prior to visit.     Per HPI unless specifically indicated in ROS section below Review of Systems  Constitutional:  Negative for activity change, appetite change, chills, fatigue, fever and unexpected weight change.  HENT:  Negative for hearing loss.   Eyes:  Negative for visual disturbance.  Respiratory:  Negative for cough, chest tightness, shortness of breath and wheezing.   Cardiovascular:  Negative for chest pain, palpitations and leg swelling.  Gastrointestinal:  Negative for abdominal distention, abdominal pain, blood in stool, constipation, diarrhea, nausea and vomiting.  Genitourinary:  Negative for difficulty urinating and hematuria.  Musculoskeletal:  Negative for arthralgias, myalgias and neck pain.  Skin:  Negative for rash.  Neurological:  Negative for dizziness, seizures, syncope and headaches.  Hematological:  Negative for adenopathy. Does not bruise/bleed easily.  Psychiatric/Behavioral:  Negative for dysphoric mood. The patient is not nervous/anxious.     Objective:  BP 138/76   Pulse 96   Temp (!) 97.2 F (36.2 C) (Temporal)   Ht 5' 5.25" (1.657 m)   Wt 136 lb 6 oz (61.9 kg)   LMP 03/04/2014   SpO2 99%   BMI 22.52 kg/m   Wt Readings from Last 3 Encounters:  12/17/22 136 lb 6 oz (61.9 kg)  09/04/22 143 lb 6 oz (65 kg)  04/18/22 157 lb 4 oz (71.3 kg)      Physical Exam Vitals and nursing note reviewed. Exam conducted with a chaperone present.  Constitutional:      Appearance: Normal appearance. She is not ill-appearing.  HENT:     Head: Normocephalic and atraumatic.     Right Ear: Tympanic membrane, ear canal and external ear  normal. There is no impacted cerumen.     Left Ear: Tympanic membrane, ear canal and external ear normal. There is no impacted cerumen.     Mouth/Throat:     Mouth: Mucous membranes are moist.     Pharynx: Oropharynx is clear. No oropharyngeal exudate or posterior oropharyngeal erythema.  Eyes:     General:        Right eye: No discharge.        Left eye: No discharge.     Extraocular Movements: Extraocular movements intact.     Conjunctiva/sclera: Conjunctivae normal.     Pupils: Pupils are equal, round, and reactive to light.  Neck:     Thyroid:  No thyroid mass, thyromegaly or thyroid tenderness.  Cardiovascular:     Rate and Rhythm: Normal rate and regular rhythm.     Pulses: Normal pulses.     Heart sounds: Normal heart sounds. No murmur heard. Pulmonary:     Effort: Pulmonary effort is normal. No respiratory distress.     Breath sounds: Normal breath sounds. No wheezing, rhonchi or rales.  Chest:  Breasts:    Right: Normal. No swelling, bleeding, inverted nipple, mass, nipple discharge, skin change or tenderness.     Left: Normal. No swelling, bleeding, inverted nipple, mass, nipple discharge, skin change or tenderness.     Comments: Horizontal scar to left breast Abdominal:     General: Bowel sounds are normal. There is no distension.     Palpations: Abdomen is soft. There is no mass.     Tenderness: There is no abdominal tenderness. There is no guarding or rebound.     Hernia: No hernia is present.  Genitourinary:    General: Normal vulva.     Pubic Area: No rash.      Labia:        Right: No rash, tenderness or lesion.        Left: No rash, tenderness or lesion.      Vagina: Normal.     Cervix: Normal.     Uterus: Normal.      Adnexa: Right adnexa normal and left adnexa normal.     Comments:  Irritation at entrance of urethra Pap performed on cervix Musculoskeletal:     Cervical back: Normal range of motion and neck supple. No rigidity.     Right lower leg: No  edema.     Left lower leg: No edema.  Lymphadenopathy:     Cervical: No cervical adenopathy.     Upper Body:     Right upper body: No supraclavicular or axillary adenopathy.     Left upper body: No supraclavicular or axillary adenopathy.  Skin:    General: Skin is warm and dry.     Findings: No rash.  Neurological:     General: No focal deficit present.     Mental Status: She is alert. Mental status is at baseline.  Psychiatric:        Mood and Affect: Mood normal.        Behavior: Behavior normal.       Results for orders placed or performed in visit on 12/17/22  POCT Urinalysis Dipstick (Automated)  Result Value Ref Range   Color, UA yellow    Clarity, UA clear    Glucose, UA Negative Negative   Bilirubin, UA negative    Ketones, UA +/-    Spec Grav, UA 1.015 1.010 - 1.025   Blood, UA negative    pH, UA 7.0 5.0 - 8.0   Protein, UA Negative Negative   Urobilinogen, UA 0.2 0.2 or 1.0 E.U./dL   Nitrite, UA negative    Leukocytes, UA Negative Negative    Assessment & Plan:   Problem List Items Addressed This Visit     Healthcare maintenance - Primary (Chronic)    Preventative protocols reviewed and updated unless pt declined. Discussed healthy diet and lifestyle.       Relevant Orders   POCT Urinalysis Dipstick (Automated) (Completed)   Subclinical hypothyroidism    She desires to try armour thyroid in place of levothyroxine.  Reviewed this is desiccated pig thyroid extract and not optimal ratio for humans.  She wants to try  nonetheless.  Will Rx armour thyroid '30mg'$  daily. Recheck TFTs 3 months, update with effect.       Relevant Medications   thyroid (ARMOUR THYROID) 30 MG tablet   MDD (major depressive disorder), recurrent episode, moderate (HCC)    Followed by psych      Attention deficit hyperactivity disorder (ADHD), predominantly inattentive type    Followed by psychiatrist      Mixed stress and urge urinary incontinence    Update UA today -  normal.       Personal history of breast cancer    H/o L breast cancer 2009 s/p wide excision and whole breast radiation  Surgeon has retired. She needs to schedule mammogram as overdue - # provided.       Vitamin D deficiency    Levels low normal - she's ben off vit D 4000 IU daily - will restart this.       Chronic fatigue    Update ferritin.       Relevant Orders   Ferritin   Thyroid nodule    Last thyroid US stable 09/2021 - rec rpt 09/2023 for final 5 yr thyroid US to document stability.       Relevant Medications   thyroid (ARMOUR THYROID) 30 MG tablet   Patient on ketogenic diet   Osteoporosis    Discussed recent DEXA report - reviewed dietary calcium recommendations, rec restart vit D supplementation.  Will provide handout on prolia (denosumab) and fosamax (alendronate) for pt to review.  Discussed risk of atypical hip fracture. She will discuss with dentist.      Recent weight loss    Significant intentional 40 lb weight loss over the past year - pt attributes to following low carb diet. Recent labwork reassuring. Encouraged she complete overdue preventative health maintenance including mammogram.       Other Visit Diagnoses     Encounter for annual routine gynecological examination       Relevant Orders   Cytology - PAP   Hyperglycemia       Relevant Orders   Hemoglobin A1c        Meds ordered this encounter  Medications   thyroid (ARMOUR THYROID) 30 MG tablet    Sig: Take 1 tablet (30 mg total) by mouth daily before breakfast.    Dispense:  30 tablet    Refill:  6    To replace levothyroxine 50mg    Orders Placed This Encounter  Procedures   Ferritin   Hemoglobin A1c   POCT Urinalysis Dipstick (Automated)    Patient Instructions  Labs today  Urinalysis today Call [facility] to schedule mammogram [phone] smear today  Goal '1200mg'$  calcium/day. Try to get most or all of your calcium from your food To figure out dietary  calcium: 300 mg/day from all non dairy foods plus 300 mg per cup of milk, other dairy, or fortified juice. Non dairy foods that contain calcium: Kale, oranges, sardines, oatmeal, soy milk/soybeans, salmon, white beans, dried figs, turnip greens, almonds, broccoli, tofu.   May try armour thyroid for a few months, update [place] effect. Schedule lab visit in 2-3 months to recheck thyroid levels.   Return in 6 months for follow up visit  Follow up plan: Return in about 6 months (around 06/17/2023) for follow up visit.  JRia Bush MD

## 2022-12-18 ENCOUNTER — Ambulatory Visit
Admission: RE | Admit: 2022-12-18 | Discharge: 2022-12-18 | Disposition: A | Discharge status: Home / Self Care | Payer: Managed Care, Other (non HMO) | Source: Ambulatory Visit | Attending: Family Medicine | Admitting: Family Medicine

## 2022-12-18 DIAGNOSIS — Z1231 Encounter for screening mammogram for malignant neoplasm of breast: Secondary | ICD-10-CM | POA: Diagnosis present

## 2022-12-18 LAB — HEMOGLOBIN A1C
Est. average glucose Bld gHb Est-mCnc: 114 mg/dL
Hgb A1c MFr Bld: 5.6 % (ref 4.8–5.6)

## 2022-12-18 LAB — FERRITIN

## 2022-12-19 LAB — CYTOLOGY - PAP
Comment: NEGATIVE
High risk HPV: NEGATIVE

## 2022-12-20 ENCOUNTER — Other Ambulatory Visit: Payer: Self-pay | Admitting: Family Medicine

## 2022-12-20 DIAGNOSIS — R87612 Low grade squamous intraepithelial lesion on cytologic smear of cervix (LGSIL): Secondary | ICD-10-CM

## 2022-12-20 DIAGNOSIS — R87619 Unspecified abnormal cytological findings in specimens from cervix uteri: Secondary | ICD-10-CM | POA: Insufficient documentation

## 2023-01-10 ENCOUNTER — Ambulatory Visit: Payer: Managed Care, Other (non HMO) | Admitting: Obstetrics and Gynecology

## 2023-02-07 ENCOUNTER — Ambulatory Visit: Payer: Managed Care, Other (non HMO) | Admitting: Obstetrics and Gynecology

## 2023-02-07 ENCOUNTER — Encounter: Payer: Self-pay | Admitting: Obstetrics and Gynecology

## 2023-02-07 VITALS — BP 109/54 | HR 94 | Resp 16 | Ht 65.5 in | Wt 135.1 lb

## 2023-02-07 DIAGNOSIS — N870 Mild cervical dysplasia: Secondary | ICD-10-CM | POA: Diagnosis not present

## 2023-02-07 DIAGNOSIS — R87612 Low grade squamous intraepithelial lesion on cytologic smear of cervix (LGSIL): Secondary | ICD-10-CM

## 2023-02-07 NOTE — Progress Notes (Signed)
    GYNECOLOGY OFFICE COLPOSCOPY PROCEDURE NOTE  57 y.o. G3P0010 here for colposcopy for Low grade squamous intraepithelial lesion (LSIL) Abnormal  pap smear on 12/17/2022.  Referred from [facility]. Discussed role for HPV in cervical dysplasia, need for surveillance. Of note, patient reports that she is a breast cancer survivor.  Has concerns about her risk of cancer again due to abnormal pap cells. Lastly mentions vaginal dryness.   Patient gave informed written consent, time out was performed.  Placed in lithotomy position. Cervix viewed with speculum and colposcope after application of acetic acid.   Colposcopy adequate? Yes  no mosaicism, no punctation, no abnormal vasculature, and acetowhite lesion(s) noted at 5 o'clock; corresponding biopsies obtained.  ECC specimen obtained. All specimens were labeled and sent to pathology.  Chaperone was present during entire procedure.  Patient was given post procedure instructions.  Will follow up pathology and manage accordingly; patient will be contacted with results and recommendations.  Routine preventative health maintenance measures emphasized. Can follow up if needed for further discussion of vaginal dryness/atrophy as desired. Given handouts on Premarin and Intrarosa.    Hildred Laser, MD [facility]

## 2023-02-11 ENCOUNTER — Encounter: Payer: Self-pay | Admitting: Obstetrics and Gynecology

## 2023-02-13 LAB — ANATOMIC PATHOLOGY REPORT

## 2023-02-26 ENCOUNTER — Telehealth: Payer: Self-pay | Admitting: Family Medicine

## 2023-02-26 ENCOUNTER — Encounter: Payer: Managed Care, Other (non HMO) | Attending: Psychology | Admitting: Psychology

## 2023-02-26 DIAGNOSIS — G479 Sleep disorder, unspecified: Secondary | ICD-10-CM | POA: Diagnosis present

## 2023-02-26 DIAGNOSIS — F900 Attention-deficit hyperactivity disorder, predominantly inattentive type: Secondary | ICD-10-CM | POA: Insufficient documentation

## 2023-02-26 DIAGNOSIS — Z0279 Encounter for issue of other medical certificate: Secondary | ICD-10-CM

## 2023-02-26 DIAGNOSIS — R5382 Chronic fatigue, unspecified: Secondary | ICD-10-CM | POA: Insufficient documentation

## 2023-02-26 DIAGNOSIS — F410 Panic disorder [episodic paroxysmal anxiety] without agoraphobia: Secondary | ICD-10-CM | POA: Insufficient documentation

## 2023-02-26 DIAGNOSIS — F09 Unspecified mental disorder due to known physiological condition: Secondary | ICD-10-CM | POA: Diagnosis not present

## 2023-02-26 NOTE — Telephone Encounter (Signed)
Placed form in Dr. G's box.  

## 2023-02-26 NOTE — Telephone Encounter (Signed)
Patient dropped off document Insurance Form continuation of disability, to be filled out by provider. Patient requested to send it via Fax within ASAP. Document is located in providers tray at front office.Please advise at Mobile [phone] (mobile). Patient would like a copy of completed forms. Needs office progress notes sent with paperwork and has signed release to send these notes. Aware of turnaround time for form completion.

## 2023-03-05 ENCOUNTER — Other Ambulatory Visit (INDEPENDENT_AMBULATORY_CARE_PROVIDER_SITE_OTHER): Payer: Managed Care, Other (non HMO)

## 2023-03-05 ENCOUNTER — Other Ambulatory Visit: Payer: Self-pay | Admitting: Family Medicine

## 2023-03-05 DIAGNOSIS — E038 Other specified hypothyroidism: Secondary | ICD-10-CM

## 2023-03-05 DIAGNOSIS — R5382 Chronic fatigue, unspecified: Secondary | ICD-10-CM | POA: Diagnosis not present

## 2023-03-05 DIAGNOSIS — E559 Vitamin D deficiency, unspecified: Secondary | ICD-10-CM

## 2023-03-05 MED ORDER — ARMOUR THYROID 30 MG PO TABS: 30.0000 mg | ORAL_TABLET | Freq: Every day | ORAL | 6 refills | Status: DC

## 2023-03-05 NOTE — Addendum Note (Signed)
Addended by: Alvina Chou on: 03/05/2023 02:21 PM   Modules accepted: Orders

## 2023-03-05 NOTE — Telephone Encounter (Signed)
Spoke with pt notifying her form is done, there is a $29.00 fee to have it completed and that it was faxed. Pt verbalizes understanding and expresses her thanks. Says she will pick up original when she comes in for lab visit today.   [Placed original form at front office. Made copy to scan and 1 for billing.]

## 2023-03-05 NOTE — Progress Notes (Signed)
Pt requests brand Armour Thyroid

## 2023-03-05 NOTE — Telephone Encounter (Signed)
Filled and in Lisa's box.  

## 2023-03-06 LAB — T3: T3, Total: 100 ng/dL (ref 71–180)

## 2023-03-06 LAB — VITAMIN D 25 HYDROXY (VIT D DEFICIENCY, FRACTURES): Vit D, 25-Hydroxy: 47.4 ng/mL (ref 30.0–100.0)

## 2023-03-06 LAB — TSH: TSH: 1.36 u[IU]/mL (ref 0.450–4.500)

## 2023-03-06 LAB — T4, FREE: Free T4: 0.98 ng/dL (ref 0.82–1.77)

## 2023-03-06 LAB — VITAMIN B12: Vitamin B-12: 627 pg/mL (ref 232–1245)

## 2023-03-06 LAB — FERRITIN: Ferritin: 261 ng/mL — ABNORMAL HIGH (ref 15–150)

## 2023-03-07 LAB — IODINE, RANDOM URINE: Iodine, Urine: 25.8 ug/L — ABNORMAL LOW (ref 28.0–544.0)

## 2023-03-11 LAB — VITAMIN B1

## 2023-03-11 LAB — VITAMIN B6: Vitamin B6: 33.5 ug/L (ref 3.4–65.2)

## 2023-03-20 NOTE — Progress Notes (Signed)
Neuropsychology Visit  Patient:  Susan Soto   DOB: Mar 10, 1966  MR Number: [id number]  Location: [facility] PHYSICAL MEDICINE & REHABILITATION [address] [place], [id number] [facility] [place] [address] Dept: [phone]  Date of Service: 02/26/2023  Start: 10 AM End: 11 and a.m.  Duration of Service: 1 Hour  Today's visit was an in person visit that was conducted in my outpatient clinic office with the patient myself present.  Provider/Observer:     Hershal Coria PsyD  Chief Complaint:      Chief Complaint  Patient presents with   Anxiety   Depression   ADHD   Seizures   Sleeping Problem   Panic Attack    Reason For Service:     Susan Soto is a 57 year old female referred by Merlyn Albert, MD with [facility].  The patient has a history of sleep disturbance with reports of cognitive disturbance and bilateral carpal tunnel syndrome.  The patient has been out of work after taking a medical leave in 2017 but then returning to work in a different job.  The patient has a history of major depressive disorder, adult residual attention deficit disorder and potentially residual cognitive difficulties after being diagnosed and treated for breast cancer in 2009 with both chemotherapy and radiation.  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   Patient reports that she was diagnosed with inattentive type attention deficit disorder in her early 64s and always had significant weakness keeping up with paperwork and documentation with her job.  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  The patient reports that she also had significant issues with major depressive disorder.  She reports that she took a medical leave of absence in 2017 after developing significant  exacerbation of her major depression with her mother passing away.  The patient returned to work after couple of months with an opportunity for a different job that had less demands and lower caseload.  Patient reports that she continued to have difficulty keeping up with any kind of paperwork and her previous strategies for dealing with that were not very helpful.  The patient reports that if she is not taking any medications that she just stays in bed, has no energy and significant difficulty with motivation.  The patient reports that this was true even when she was a young child.   The patient reports that she had an exacerbation of her difficulties after her chemotherapy treatment for breast cancer.  The patient reports that initially she experienced "chemo brain" but got better and then in 2017 her symptoms returned and she deteriorated.  The patient reports that typically she would have times of "hyperfocus" to get caught up now she cannot "hyperfocus" and has difficulty keeping up with paperwork.  The patient reports that she made improvements while out on FMLA after her mother and close aunt died.   The patient reports that she has seen a psychiatrist for 20 years in [place].   Patient reports being extremely stressed at work and feeling like she is extremely behind on her caseload and paperwork.  Her job has allowed her to start coming in at 10 AM to help her get better sleep.  Even with this adjustment the patient has difficulty getting into work before 11 AM.  The patient reports that she  cant get her thoughts focused enough to do anything even at home.  She is trying to work long enough to retire early.    The patient is not sleeping well but no significant findings on sleep study beyond delayed REM onset.  The patient had been on extreme low fat diet for many years but has stopped this diet.     The patient now has sudden onset of anxiety and panic attacks.     03/28/20 Update:  The patient  returned for visit and brought the paperwork that she had forgotten in Feb.  The patient described a a decline in memory and executive functioning along with increasing anxiety and worsening of ADD/attention symptoms.  She describes new onset panic attacks within the past 6 months.  "pins and needles and pain in fingers described".  She describes a decline in her ability to perform tasks that involve concentration.  She continues to use Dexedrine for attention along with valium 1-2 times per day.  The patient had been out on FMLA due to major depresion and new onset of anxiety.  FMLA has run out and the patient has had to stop working but her short-term disability has denied her so far, attributing it to a pre-existing condition according to patient.  The patient describes severe insomnia (not OSA) but has had abnormalities on sleep study related to reduction in REM sleep.    04/11/2020 update: The patient return for a visit today and reports that she has been having some improvements in her sleep pattern and there has been some improvements in her fatigue during the day.  She has reduced how much stimulants she is taking as well as taking less Valium.  The patient reports that she continues to have significant difficulties getting going in the morning but she has been going to bed earlier at night and getting up in the morning sooner and on many days she is able to get up at sunrise.  She has been following sleep hygiene efforts.  05/26/2020: The patient reports that she is continued to have some improvements but continues with significant attentional issues and disturbed sleep.  The patient is continued with the lower dose of psychostimulant medications but this has a negative impact on her attention and concentration but does seem to help with her anxiety symptoms.  The patient has continued to work on sleep hygiene issues and has improved somewhat and will be following up with her neurologist  soon.  06/20/2020: The patient reports that she has continued to make improvements particular around her sleep functioning.  The patient reports that she went to the beach to spend some time as well as visit her friend and was away from her husband another household stressors.  She actively worked on sleep hygiene issues and focused on very fundamental behavioral consistencies.  She reports that she did much better as far as her anxiety and stress.  There continues to be a lot of frustration and stressors associated with her husband's activities and behaviors and the patient is working on more focus on taking care of herself rather than trying to fix all intervene with his issues that he himself is not acknowledging or actively trying to address.  09/08/2020: The patient missed her last appointment as she had documented the time in a confusing way.  It was an 8:00 appointment and she typically has great difficulty getting herself organized to these appointments and miss documented in her calendar and was going to be too  late for the appointment before she realized this discrepancy.  The patient reports that in someway she is done better and has been actively working on a number of things and has made more accomplishments in this period of time then she typically gives herself credit for.  The patient is done very well working on foundational issues and has been improving her behavioral aspects of sleep hygiene, paying close attention all to good nutrition and physical action and activity.  The patient reports that she continues to have difficulty maintaining attention and focus and there have been efforts to adjust some of her medications for optimization.  The patient reports that there continue to be significant stressors with her husband and her husband's psychiatric issues of severe OCD including hoarding type behaviors.  10/06/2020: The patient reports that she has continued to have significant issues of  cognitive difficulties and fatigue during the day.  Patient continues to have sleep disturbance and we talked about some very practical issues around sleep hygiene including sleeping in her room without pets that she reports will wake her up at various times at night.  Patient continues to have significant pain continued severe sleep disturbance but has improved sleep hygiene and sleep pattern significantly since initially being here.  11/10/2020: The patient reports that she has been off track with regard to her sleep hygiene regimen and has noticed a worsening of her cognitive difficulties including attentional issues, anxiety stress, executive function etc. when she does not follow her sleep hygiene regimen is closely as she had been.  The patient reports that there have been medical complications recently with both her and her husband developing symptomatic COVID-19 infection with the patient having more than a week of illness.  During the time her husband was sick he did completely stop drinking which she was hopeful would lead to his continued cessation of alcohol use.  However, as soon as he felt well enough to go back to longstanding habits he did return to his alcohol use and consumption but initially restricted it to drinking beer but is now returned to his previous multi sources of alcohol use.  The patient reports that she has gotten better about being stressed over the impacts of his alcohol abuse realizing that there is little to nothing that she can do to manage or control his activities.  The patient is focused on her wellbeing overall and is learned a number of viable coping skills and implemented those to help her symptoms.  However, she continues to have significant attentional deficits and other cognitive deficits.  01/05/2021: The patient appeared to be in some of the best status she has been since I started seeing her.  Her anxiety was down and she had good cognitive and mental status.  The  patient's mood was better and she reports that she has been sleeping better although recently she has not been doing all of the sleep hygiene that she had done to affect improved sleep patterns.  Psychosocial stressors have been a little bit better with her husband returning to work in his office during the day which gives her a break from being around him 24/7.  Her husband continues to consume significant amounts of alcohol which limit their healthy interactions.  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  The patient reports that things have continued to be very similar at home although her husband has gone back to work.  The patient also house that for a friend for a weekend that also went very well.  The patient reports that she has had times where her anxiety seems to exacerbate but overall she continues to do better.  04/26/2021: The patient reports that her uncle is moved into the office structure that they built behind their house that was originally built to house her husband's home office that was not being used.  She reports that initially her husband was resistant to this but decided it was okay.  She reports that overall this is turned out well and it is helped motivate her in some aspects.  The patient continues to struggle with completing task but was able to help get the structure ready for her uncle.  He has not overextended himself into their day-to-day lives and it does appear to be going fairly well.  The patient reports that her pain continues as well as stress of depression but she has been having fewer panic attacks.  09/11/2021: The patient reports that things have been changing around her house significantly lately.  The  patient reports that her and her husband have decided to look at selling their house and they have had to be doing a great deal of things around the house or getting ready to to prepare it for sale.  The plan is to move into a smaller home but her husband's compulsive buying and hoarding of types of clothes etc. are having to be changed and fixed.  There is plan to do some remodeling in the house which are all can fall on her shoulders.  However, she is looking forward to making some significant changes which will help her overall.  We continue to work on therapeutic interventions around her cognitive issues and depression and anxiety.  11/21/2021: The patient reports that there have been some stressors between her and her husband recently that have been more than typical.  He continues to abuse alcohol but after this being specifically called out by their son who is now living with them he has made some attempts at reducing the amount of alcohol he is consuming.  His strategy has been to switch from distilled liquor to beer.  While this is produced some changes and reduced the level of his intoxication it is only primarily been done during the week and on weekends he goes back to consuming larger amounts of distilled alcohol.  The patient is very frustrated by his unwillingness to transfer money into her account so she could pay her insurance bills while at the same time continuing his compulsive buying habits and hoarding behaviors etc.  The patient is, however, actively working on trying to take care of herself first and feels like she is making some personal gains and doing better overall.  She is actively working on foundational issues such as diet, sleep and physical activity.  She continues to have attentional issues and other cognitive issues secondary to significant attention and concentration deficits.  Patient reports an improvement in her depression and anxiety symptoms.  01/11/2022: It has been  several months since I saw the patient last and she reports that some of the stressors around her house have been better with her husband drinking less alcohol and her working on some  of the issues related to her panic events and anxiety.  However, she has had some worsening of some of her attentional difficulties fatigue and motivation.  There have been difficulties with medications particularly stimulant type medications due to national shortage of these medications and some attempts at changing medications.  The patient reports that this is left her with extended days to weeks of time without medications.  There was an attempt to switch to Focalin at 1 point and while she felt it was initially helpful she began having significant changes in her mood state and worsening depression.  As she ran out of this medicine relatively quickly her depressive symptoms improved.  They are still working on trying to adjust and acquire psychostimulant medications.  Today we continue to work on therapeutic interventions around her panic disorders, attentional issues and neurocognitive difficulties.  The patient's attention and concentration and memory along with executive functioning continue to be quite impaired.  Sleep continues to be disturbed.  02/27/2022: Following up with the patient she continues to have a lot of stressors around psychosocial issues.  Her attention and cognitive difficulties and ongoing issues with functional neurological issues remain quite problematic.  The patient has actively been working on therapeutic interventions and reports that overall she is improving but there continues to be a lot of psychosocial stressors with her husband's alcohol use in particular.  We continue to work on therapeutic interventions around issues related to her panic disorder and attentional issues and other neurocognitive difficulties.  02/26/2023: Today was a follow-up visit with the patient.  The patient reports that there  have been a lot of changes since I saw her last.  She reports that they have purchased a new house and are readying the sale of their old house and it moved almost completely out other than items left in the garage.  This was a very challenging and stressful event and she got little help from her husband although he did take care of his individual items for the most part.  The patient reports that there have been some adjustments to living in a house with nearby neighbors but overall she feels like she has made progress.  She has been working on therapeutic interventions we have discussed as far as coping and continues to look for ways of better managing her overall status.  Treatment Interventions:  Cognitive Behavioral and coping strategies along with continued history.  Participation Level:   Active  Participation Quality:  Appropriate and Redirectable      Behavioral Observation:  Well Groomed, Alert, and Appropriate.   Current Psychosocial Factors: Patient reports that they are still significant struggles with psychosocial stressors.  Patient and her husband have been offering on a house and have their house up for sale and have done a significant amount of work since I saw her last.  The patient is still struggling with her husband's alcoholism and how that leads her doing most of the day today work.  Content of Session:   Reviewed current symptoms and worked on coping and adjustment issues.    Effectiveness of Interventions: Patient is very motivated to improve and working hard on some of the challenging behavioral changes we have done around sleep hygiene.    Target Goals:   Improve sleep, work on anxiety and depression along with cognitive deficits.  Goals Last Reviewed:   02/26/2023  Goals Addressed Today:    Continued with sleep hygiene issues and concerns around cognitive deficits and coping.  Impression/Diagnosis:  Susan Soto is a 57 year old female referred by Merlyn Albert, MD with [facility].  The patient has a history of sleep disturbance with reports of cognitive disturbance and bilateral carpal tunnel syndrome.  The patient has been out of work after taking a medical leave in 2017 but then returning to work in a different job.  The patient has a history of major depressive disorder, adult residual attention deficit disorder and potentially residual cognitive difficulties after being diagnosed and treated for breast cancer in 2009 with both chemotherapy and radiation.  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  The patient continues with significant anxiety, pain and sleep disturbance.  The patient has been actively working on therapeutic interventions today we worked on some very practical strategies for aiding her free recall of information and strategies for completing tasks.  10/06/2020: The patient reports that she has continued to have significant issues of cognitive difficulties and fatigue during the day.  Patient continues to have sleep disturbance and we talked about some very practical issues around sleep hygiene including sleeping in her room without pets that she reports will wake her up at various times at night.  Patient continues to have significant pain continued severe sleep disturbance but has improved sleep hygiene and sleep pattern significantly since initially being here.  11/10/2020: The patient reports that she has been off track with regard to her sleep hygiene regimen and has noticed a worsening of her cognitive difficulties including attentional issues, anxiety stress, executive function etc. when she does not follow her sleep hygiene regimen is closely as she had been.  The patient reports that there have been medical complications recently with both her and her husband developing  symptomatic COVID-19 infection with the patient having more than a week of illness.  During the time her husband was sick he did completely stop drinking which she was hopeful would lead to his continued cessation of alcohol use.  However, as soon as he felt well enough to go back to longstanding habits he did return to his alcohol use and consumption but initially restricted it to drinking beer but is now returned to his previous multi sources of alcohol use.  The patient reports that she has gotten better about being stressed over the impacts of his alcohol abuse realizing that there is little to nothing that she can do to manage or control his activities.  The patient is focused on her wellbeing overall and is learned a number of viable coping skills and implemented those to help her symptoms.  However, she continues to have significant attentional deficits and other cognitive deficits.  01/05/2021: The patient appeared to be in some of the best status she has been since I started seeing her.  Her anxiety was down and she had good cognitive and mental status.  The patient's mood was better and she reports that she has been sleeping better although recently she has not been doing all of the sleep hygiene that she had done to affect improved sleep patterns.  Psychosocial stressors have been a little bit better with her husband returning to work in his office during the day which gives her a break from being around him 24/7.  Her husband continues to consume significant amounts of alcohol which limit their healthy interactions.  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  The patient reports that things have continued to be very similar at home although  her husband has gone back to work.  The patient also house that for a friend for a weekend that also went very well.  The patient reports that she has had times where her anxiety seems to exacerbate but overall she continues to do better.  04/26/2021: The patient reports that her uncle is moved into the office structure that they built behind their house that was originally built to house her husband's home office that was not being used.  She reports that initially her husband was resistant to this but decided it was okay.  She reports that overall this is turned out well and it is helped motivate her in some aspects.  The patient continues to struggle with completing task but was able to help get the structure ready for her uncle.  He has not overextended himself into their day-to-day lives and it does appear to be going fairly well.  The patient reports that her pain continues as well as stress of depression but she has been having fewer panic attacks.  09/11/2021: The patient reports that things have been changing around her house significantly lately.  The patient reports that her and her husband have decided to look at selling their house and they have had to be doing a great deal of things around the house or getting ready to to prepare it for sale.  The plan is to move into a smaller home but her husband's compulsive buying and hoarding of types of clothes etc. are having to be changed and fixed.  There is plan to do some remodeling in the house which are all can fall on her shoulders.  However, she is looking forward to making some significant changes which will help her overall.  We continue to work on therapeutic interventions around her cognitive issues and depression and anxiety.  11/21/2021: The patient reports that there have been some stressors between her and her husband recently that have been more than typical.  He continues to abuse alcohol but after this being specifically called out by their  son who is now living with them he has made some attempts at reducing the amount of alcohol he is consuming.  His strategy has been to switch from distilled liquor to beer.  While this is produced some changes and reduced the level of his intoxication it is only primarily been done during the week and on weekends he goes back to consuming larger amounts of distilled alcohol.  The patient is very frustrated by his unwillingness to transfer money into her account so she could pay her insurance bills while at the same time continuing his compulsive buying habits and hoarding behaviors etc.  The patient is, however, actively working on trying to take care of herself first and feels like she is making some personal gains and doing better overall.  She is actively working on foundational issues such as diet, sleep and physical activity.  She continues to have attentional issues and other cognitive issues secondary to significant attention and concentration deficits.  Patient reports an improvement in her depression and anxiety symptoms.  02/27/2022: Following up with the patient she  continues to have a lot of stressors around psychosocial issues.  Her attention and cognitive difficulties and ongoing issues with functional neurological issues remain quite problematic.  The patient has actively been working on therapeutic interventions and reports that overall she is improving but there continues to be a lot of psychosocial stressors with her husband's alcohol use in particular.  We continue to work on therapeutic interventions around issues related to her panic disorder and attentional issues and other neurocognitive difficulties.  02/26/2023: Today was a follow-up visit with the patient.  The patient reports that there have been a lot of changes since I saw her last.  She reports that they have purchased a new house and are readying the sale of their old house and it moved almost completely out other than items left in  the garage.  This was a very challenging and stressful event and she got little help from her husband although he did take care of his individual items for the most part.  The patient reports that there have been some adjustments to living in a house with nearby neighbors but overall she feels like she has made progress.  She has been working on therapeutic interventions we have discussed as far as coping and continues to look for ways of better managing her overall status.  Diagnosis:   Attention deficit hyperactivity disorder (ADHD), predominantly inattentive type  Panic disorder  Cognitive and neurobehavioral dysfunction  Chronic fatigue  Sleep disturbance    Arley Phenix, Psy.D. Clinical Psychologist Neuropsychologist

## 2023-05-22 ENCOUNTER — Encounter (INDEPENDENT_AMBULATORY_CARE_PROVIDER_SITE_OTHER): Payer: Self-pay

## 2023-06-17 ENCOUNTER — Ambulatory Visit (INDEPENDENT_AMBULATORY_CARE_PROVIDER_SITE_OTHER): Payer: Managed Care, Other (non HMO) | Admitting: Family Medicine

## 2023-06-17 ENCOUNTER — Encounter: Payer: Self-pay | Admitting: Family Medicine

## 2023-06-17 VITALS — BP 112/76 | HR 69 | Temp 98.2°F | Wt 133.0 lb

## 2023-06-17 DIAGNOSIS — E038 Other specified hypothyroidism: Secondary | ICD-10-CM | POA: Diagnosis not present

## 2023-06-17 DIAGNOSIS — F900 Attention-deficit hyperactivity disorder, predominantly inattentive type: Secondary | ICD-10-CM | POA: Diagnosis not present

## 2023-06-17 DIAGNOSIS — R7989 Other specified abnormal findings of blood chemistry: Secondary | ICD-10-CM | POA: Insufficient documentation

## 2023-06-17 DIAGNOSIS — R5382 Chronic fatigue, unspecified: Secondary | ICD-10-CM

## 2023-06-17 DIAGNOSIS — B001 Herpesviral vesicular dermatitis: Secondary | ICD-10-CM

## 2023-06-17 DIAGNOSIS — J22 Unspecified acute lower respiratory infection: Secondary | ICD-10-CM | POA: Insufficient documentation

## 2023-06-17 DIAGNOSIS — F09 Unspecified mental disorder due to known physiological condition: Secondary | ICD-10-CM

## 2023-06-17 MED ORDER — VALACYCLOVIR HCL 1 G PO TABS: 2000.0000 mg | ORAL_TABLET | Freq: Two times a day (BID) | ORAL | 1 refills | Status: DC

## 2023-06-17 NOTE — Assessment & Plan Note (Signed)
Update labs as per above.

## 2023-06-17 NOTE — Assessment & Plan Note (Signed)
Anticipate recent illness due to COVID, symptoms have now resolved.

## 2023-06-17 NOTE — Addendum Note (Signed)
Addended by: Prince Solian A on: 06/17/2023 03:34 PM   Modules accepted: Orders

## 2023-06-17 NOTE — Assessment & Plan Note (Signed)
Reviewed stimulant changes by psychiatrist

## 2023-06-17 NOTE — Assessment & Plan Note (Signed)
No fmhx or personal history of hemochromatosis.  Update ferritin. If persistently elevated, consider hemochromatosis eval.

## 2023-06-17 NOTE — Assessment & Plan Note (Signed)
Valtrex refilled.

## 2023-06-17 NOTE — Assessment & Plan Note (Signed)
Overall feeling better on Armour Thyroid 15mg  than when she was on levothyroxine - will update TFTs on current regimen.

## 2023-06-17 NOTE — Progress Notes (Signed)
Ph: [phone] Fax: [phone]   Patient ID: Susan Soto, female    DOB: April 01, 1966, 57 y.o.   MRN: [id number]  This visit was conducted in person.  BP 112/76   Pulse 69   Temp 98.2 F (36.8 C)   Wt 133 lb (60.3 kg)   LMP 03/04/2014   SpO2 97%   BMI 21.80 kg/m    CC: 6 mo f/u visit  Subjective:   HPI: Susan Soto is a 57 y.o. female presenting on 06/17/2023 for No chief complaint on file.   Respiratory infection last week - body aches, fever/chills, chest congestion and cough.  Son also sick. Cousins had recently been sick with COVID. She didn't get tested.  Feeling fully better from this.  Recent trip back from beach.   Sees psychiatrist (Dr Kara Dies) and Dr Kieth Brightly neuropsychologist for cognitive and neurobehavioral dysfunction, circadian rhythm sleep disorder, chronic fatigue, hypothyroidism, MDD, GAD with panic disorder and ADHD. Released from sleep doctor (Dohmeier). Planning to establish with therapist. Her stimulant was changed from dextroamphetamine sulfate (Zenzeti, generic dexedrine) to adderall due to medication shortages. She feels adderall is more effective.    She feels levothyroxine was not effective - last visit changed to armour thyroid 30mg  daily.   Requests valtrex refilled.  No fmhx hemochromatosis. Mother with polycythemia.      Relevant past medical, surgical, family and social history reviewed and updated as indicated. Interim medical history since our last visit reviewed. Allergies and medications reviewed and updated. Outpatient Medications Prior to Visit  Medication Sig Dispense Refill   amphetamine-dextroamphetamine (ADDERALL) 20 MG tablet Take 0.5-1 tablets (10-20 mg total) by mouth daily as needed.     dextroamphetamine (ZENZEDI) 10 MG tablet Take 1 tablet (10 mg total) by mouth daily.     ARMOUR THYROID 30 MG tablet Take 1 tablet (30 mg total) by mouth daily before breakfast. 30 tablet 6   Ascorbic Acid (VITAMIN C)  POWD Take 1,000 mg by mouth daily.     ASHWAGANDHA PO Take 375 mg by mouth at bedtime.     B Complex Vitamins (VITAMIN B COMPLEX PO) Take by mouth daily.     Cholecalciferol 100 MCG (4000 UT) CAPS Take 1 capsule (4,000 Units total) by mouth daily.     COLLAGEN PO Take 1 tablet by mouth daily.     diazepam (VALIUM) 5 MG tablet Take 5 mg by mouth at bedtime.     medium chain triglycerides (MCT OIL) oil Take 15 mLs by mouth daily. Takes 3 TBSP daily     Multiple Vitamins-Minerals (ANTIOXIDANT FORMULA SG) capsule Take 1 capsule by mouth daily. Powder daily     Omega-3 Fatty Acids (OMEGA 3 PO) Take 690 mg by mouth daily. Nordic naturals     OVER THE COUNTER MEDICATION 2 capsules daily. KETO- metabolic ketosis support     Probiotic Product (PROBIOTIC DAILY PO) Take by mouth daily.     ZENZEDI 20 MG TABS Take 1 tablet by mouth every morning.     medium chain triglycerides (MCT OIL) oil Take by mouth 3 (three) times daily. Takes 3 TBSP daily     valACYclovir (VALTREX) 1000 MG tablet Take 2 tablets (2,000 mg total) by mouth in the morning and at bedtime. 20 tablet 1   No facility-administered medications prior to visit.     Per HPI unless specifically indicated in ROS section below Review of Systems  Objective:  BP 112/76   Pulse 69  Temp 98.2 F (36.8 C)   Wt 133 lb (60.3 kg)   LMP 03/04/2014   SpO2 97%   BMI 21.80 kg/m   Wt Readings from Last 3 Encounters:  06/17/23 133 lb (60.3 kg)  02/07/23 135 lb 1.6 oz (61.3 kg)  12/17/22 136 lb 6 oz (61.9 kg)      Physical Exam Vitals and nursing note reviewed.  Constitutional:      Appearance: Normal appearance. She is not ill-appearing.  HENT:     Head: Normocephalic and atraumatic.     Nose: Nose normal.     Mouth/Throat:     Mouth: Mucous membranes are moist.     Pharynx: Oropharynx is clear. No oropharyngeal exudate or posterior oropharyngeal erythema.  Eyes:     General:        Right eye: No discharge.        Left eye: No  discharge.     Extraocular Movements: Extraocular movements intact.     Conjunctiva/sclera: Conjunctivae normal.     Pupils: Pupils are equal, round, and reactive to light.  Neck:     Thyroid: No thyroid mass or thyromegaly.  Cardiovascular:     Rate and Rhythm: Normal rate and regular rhythm.     Pulses: Normal pulses.     Heart sounds: Normal heart sounds. No murmur heard. Pulmonary:     Effort: Pulmonary effort is normal. No respiratory distress.     Breath sounds: Normal breath sounds. No wheezing, rhonchi or rales.  Musculoskeletal:     Cervical back: Normal range of motion and neck supple. No rigidity.     Right lower leg: No edema.     Left lower leg: No edema.  Lymphadenopathy:     Cervical: No cervical adenopathy.  Skin:    General: Skin is warm and dry.     Findings: No rash.  Neurological:     Mental Status: She is alert.  Psychiatric:        Mood and Affect: Mood normal.        Behavior: Behavior normal.       Results for orders placed or performed in visit on 03/05/23  Iodine, Random Urine  Result Value Ref Range   Iodine, Urine 25.8 (L) 28.0 - 544.0 ug/L  VITAMIN D 25 Hydroxy (Vit-D Deficiency, Fractures)  Result Value Ref Range   Vit D, 25-Hydroxy 47.4 30.0 - 100.0 ng/mL  Vitamin B12  Result Value Ref Range   Vitamin B-12 627 232 - 1,245 pg/mL  Vitamin B1  Result Value Ref Range   Thiamine CANCELED nmol/L  Vitamin B6  Result Value Ref Range   Vitamin B6 33.5 3.4 - 65.2 ug/L  TSH  Result Value Ref Range   TSH 1.360 0.450 - 4.500 uIU/mL  T4, free  Result Value Ref Range   Free T4 0.98 0.82 - 1.77 ng/dL  T3  Result Value Ref Range   T3, Total 100 71 - 180 ng/dL  Ferritin  Result Value Ref Range   Ferritin 261 (H) 15 - 150 ng/mL   Lab Results  Component Value Date   NA 139 12/10/2022   CL 103 12/10/2022   K 4.5 12/10/2022   CO2 22 12/10/2022   BUN 14 12/10/2022   CREATININE 0.65 12/10/2022   EGFR 103 12/10/2022   CALCIUM 9.5 12/10/2022    ALBUMIN 4.7 12/10/2022   GLUCOSE 96 12/10/2022   Lab Results  Component Value Date   HGBA1C 5.6 12/17/2022  Assessment & Plan:   Problem List Items Addressed This Visit     Subclinical hypothyroidism - Primary    Overall feeling better on Armour Thyroid 15mg  than when she was on levothyroxine - will update TFTs on current regimen.       Relevant Orders   TSH   T4, free   T3   Attention deficit hyperactivity disorder (ADHD), predominantly inattentive type    Reviewed stimulant changes by psychiatrist      Chronic fatigue    Update labs as per above.      Relevant Orders   Vitamin B1   Cognitive and neurobehavioral dysfunction   Relevant Orders   Vitamin B1   Acute respiratory infection    Anticipate recent illness due to COVID, symptoms have now resolved.       Relevant Medications   valACYclovir (VALTREX) 1000 MG tablet   Fever blister    Valtrex refilled.       Relevant Medications   valACYclovir (VALTREX) 1000 MG tablet   Elevated ferritin    No fmhx or personal history of hemochromatosis.  Update ferritin. If persistently elevated, consider hemochromatosis eval.       Relevant Orders   Ferritin     Meds ordered this encounter  Medications   valACYclovir (VALTREX) 1000 MG tablet    Sig: Take 2 tablets (2,000 mg total) by mouth in the morning and at bedtime.    Dispense:  20 tablet    Refill:  1    Orders Placed This Encounter  Procedures   TSH   T4, free   T3   Vitamin B1   Ferritin    Patient Instructions  Labs today  Continue current regimen Valtrex refilled   Follow up plan: Return if symptoms worsen or fail to improve.  Eustaquio Boyden, MD

## 2023-06-17 NOTE — Patient Instructions (Signed)
Labs today  Continue current regimen Valtrex refilled

## 2023-06-21 ENCOUNTER — Other Ambulatory Visit: Payer: Self-pay | Admitting: Family Medicine

## 2023-06-21 ENCOUNTER — Encounter: Payer: Self-pay | Admitting: Family Medicine

## 2023-06-21 LAB — TSH: TSH: 3.13 u[IU]/mL (ref 0.450–4.500)

## 2023-06-21 LAB — T3: T3, Total: 78 ng/dL (ref 71–180)

## 2023-06-21 LAB — T4, FREE: Free T4: 0.87 ng/dL (ref 0.82–1.77)

## 2023-06-21 LAB — VITAMIN B1: Thiamine: 112.8 nmol/L (ref 66.5–200.0)

## 2023-06-21 LAB — FERRITIN: Ferritin: 321 ng/mL — ABNORMAL HIGH (ref 15–150)

## 2023-06-21 MED ORDER — THYROID 60 MG PO TABS: 60.0000 mg | ORAL_TABLET | Freq: Every day | ORAL | 6 refills | Status: DC

## 2023-07-05 NOTE — Telephone Encounter (Signed)
Replied via result note - we are increasing armour thyroid to 60mg  daily with plan rpt labs in 2 months.

## 2023-09-06 ENCOUNTER — Telehealth: Payer: Self-pay | Admitting: Family Medicine

## 2023-09-06 DIAGNOSIS — E038 Other specified hypothyroidism: Secondary | ICD-10-CM

## 2023-09-06 DIAGNOSIS — E559 Vitamin D deficiency, unspecified: Secondary | ICD-10-CM

## 2023-09-06 DIAGNOSIS — E618 Deficiency of other specified nutrient elements: Secondary | ICD-10-CM

## 2023-09-06 DIAGNOSIS — R7989 Other specified abnormal findings of blood chemistry: Secondary | ICD-10-CM

## 2023-09-06 NOTE — Telephone Encounter (Signed)
Pt called stating Dr. Reece Agar wanted her to come into the office for a thyroid recheck. I don't see any orders in for pt. Can orders be submitted? Pt also asked if her vitamin D, iodine & ferritin get checked as well? Call back # [phone]

## 2023-09-09 DIAGNOSIS — E618 Deficiency of other specified nutrient elements: Secondary | ICD-10-CM | POA: Insufficient documentation

## 2023-09-09 NOTE — Telephone Encounter (Signed)
I have ordered labs. She may schedule lab visit at her convenience.

## 2023-09-09 NOTE — Telephone Encounter (Signed)
Spoke with pt relaying Dr Timoteo Expose message and scheduled lab visit tomorrow at 12:00. Pt expresses her thanks.

## 2023-09-09 NOTE — Addendum Note (Signed)
Addended by: Eustaquio Boyden on: 09/09/2023 08:08 AM   Modules accepted: Orders

## 2023-09-10 ENCOUNTER — Other Ambulatory Visit (INDEPENDENT_AMBULATORY_CARE_PROVIDER_SITE_OTHER): Payer: Managed Care, Other (non HMO)

## 2023-09-10 DIAGNOSIS — E618 Deficiency of other specified nutrient elements: Secondary | ICD-10-CM

## 2023-09-10 DIAGNOSIS — E038 Other specified hypothyroidism: Secondary | ICD-10-CM | POA: Diagnosis not present

## 2023-09-10 DIAGNOSIS — R7989 Other specified abnormal findings of blood chemistry: Secondary | ICD-10-CM

## 2023-09-10 DIAGNOSIS — E559 Vitamin D deficiency, unspecified: Secondary | ICD-10-CM

## 2023-09-11 LAB — T4, FREE: Free T4: 0.86 ng/dL (ref 0.82–1.77)

## 2023-09-17 ENCOUNTER — Other Ambulatory Visit: Payer: Self-pay | Admitting: Family Medicine

## 2023-09-17 ENCOUNTER — Encounter (INDEPENDENT_AMBULATORY_CARE_PROVIDER_SITE_OTHER): Payer: Self-pay

## 2023-09-17 ENCOUNTER — Encounter: Payer: Self-pay | Admitting: Family Medicine

## 2023-09-17 ENCOUNTER — Other Ambulatory Visit (INDEPENDENT_AMBULATORY_CARE_PROVIDER_SITE_OTHER): Payer: Managed Care, Other (non HMO)

## 2023-09-17 DIAGNOSIS — R7989 Other specified abnormal findings of blood chemistry: Secondary | ICD-10-CM | POA: Diagnosis not present

## 2023-09-17 LAB — IODINE, RANDOM URINE: Iodine, Urine: 12759.7 ug/L — ABNORMAL HIGH (ref 28.0–544.0)

## 2023-09-18 ENCOUNTER — Encounter: Payer: Self-pay | Admitting: Family Medicine

## 2023-09-18 LAB — CBC WITH DIFFERENTIAL/PLATELET
Basophils Absolute: 0 10*3/uL (ref 0.0–0.2)
Basos: 1 %
EOS (ABSOLUTE): 0.1 10*3/uL (ref 0.0–0.4)
Eos: 1 %
Hematocrit: 39.6 % (ref 34.0–46.6)
Hemoglobin: 13 g/dL (ref 11.1–15.9)
Immature Grans (Abs): 0 10*3/uL (ref 0.0–0.1)
Immature Granulocytes: 0 %
Lymphocytes Absolute: 2.8 10*3/uL (ref 0.7–3.1)
Lymphs: 50 %
MCH: 29.8 pg (ref 26.6–33.0)
MCHC: 32.8 g/dL (ref 31.5–35.7)
MCV: 91 fL (ref 79–97)
Monocytes Absolute: 0.7 10*3/uL (ref 0.1–0.9)
Monocytes: 12 %
Neutrophils Absolute: 2 10*3/uL (ref 1.4–7.0)
Neutrophils: 36 %
Platelets: 266 10*3/uL (ref 150–450)
RBC: 4.36 x10E6/uL (ref 3.77–5.28)
RDW: 13.1 % (ref 11.7–15.4)
WBC: 5.6 10*3/uL (ref 3.4–10.8)

## 2023-09-18 LAB — COMPREHENSIVE METABOLIC PANEL
ALT: 18 [IU]/L (ref 0–32)
AST: 18 [IU]/L (ref 0–40)
Albumin: 4.6 g/dL (ref 3.8–4.9)
Alkaline Phosphatase: 47 [IU]/L (ref 44–121)
BUN/Creatinine Ratio: 21 (ref 9–23)
BUN: 12 mg/dL (ref 6–24)
Bilirubin Total: 0.5 mg/dL (ref 0.0–1.2)
CO2: 26 mmol/L (ref 20–29)
Calcium: 10 mg/dL (ref 8.7–10.2)
Chloride: 101 mmol/L (ref 96–106)
Creatinine, Ser: 0.56 mg/dL — ABNORMAL LOW (ref 0.57–1.00)
Globulin, Total: 2.1 g/dL (ref 1.5–4.5)
Glucose: 91 mg/dL (ref 70–99)
Potassium: 4.3 mmol/L (ref 3.5–5.2)
Sodium: 140 mmol/L (ref 134–144)
Total Protein: 6.7 g/dL (ref 6.0–8.5)
eGFR: 106 mL/min/{1.73_m2} (ref >59)

## 2023-09-18 LAB — IRON,TIBC AND FERRITIN PANEL
Ferritin: 310 ng/mL — ABNORMAL HIGH (ref 15–150)
Iron Saturation: 41 % (ref 15–55)
Iron: 121 ug/dL (ref 27–159)
Total Iron Binding Capacity: 293 ug/dL (ref 250–450)
UIBC: 172 ug/dL (ref 131–425)

## 2023-09-18 LAB — SEDIMENTATION RATE: Sed Rate: 4 mm/h (ref 0–40)

## 2023-09-18 LAB — C-REACTIVE PROTEIN: CRP: <1 mg/L (ref 0–10)

## 2023-09-23 LAB — HEMOCHROMATOSIS DNA-PCR(C282Y,H63D)

## 2023-09-24 ENCOUNTER — Encounter: Payer: Self-pay | Admitting: Family Medicine

## 2023-09-24 DIAGNOSIS — Z148 Genetic carrier of other disease: Secondary | ICD-10-CM | POA: Insufficient documentation

## 2023-09-24 NOTE — Telephone Encounter (Signed)
Replied via lab result section. 

## 2023-09-26 LAB — TSH: TSH: 1.52 u[IU]/mL (ref 0.450–4.500)

## 2023-09-26 LAB — FERRITIN: Ferritin: 296 ng/mL — ABNORMAL HIGH (ref 15–150)

## 2023-09-26 LAB — THYROGLOBULIN LEVEL: Thyroglobulin (TG-RIA): 38 ng/mL

## 2023-09-26 LAB — VITAMIN D 25 HYDROXY (VIT D DEFICIENCY, FRACTURES): Vit D, 25-Hydroxy: 46.8 ng/mL (ref 30.0–100.0)

## 2023-09-26 LAB — T3: T3, Total: 77 ng/dL (ref 71–180)

## 2023-11-11 ENCOUNTER — Other Ambulatory Visit: Payer: Self-pay | Admitting: Family Medicine

## 2023-11-12 NOTE — Telephone Encounter (Signed)
Pharmacy comment: Alternative Requested:THE PRESCRIBED MEDICATION IS NOT COVERED BY INSURANCE. PLEASE CONSIDER CHANGING TO ONE OF THE SUGGESTED COVERED ALTERNATIVES.

## 2023-11-14 ENCOUNTER — Other Ambulatory Visit: Payer: Self-pay | Admitting: Family Medicine

## 2023-11-14 NOTE — Telephone Encounter (Signed)
See 11/11/23 refill note.

## 2023-11-14 NOTE — Telephone Encounter (Signed)
Plz notify pt we received message from pharmacy saying her Armour thyroid is not covered by insurance - which I believe we knew - so she has to pay out of pocket for this.

## 2023-11-14 NOTE — Telephone Encounter (Addendum)
Spoke with pt relaying Dr Timoteo Expose message. Pt verbalizes understanding and is aware of out-of-pocket charges.   Pt is asking when she needs to f/u for thyroid and is concerned about elevated iodine levels in labs.

## 2023-11-15 NOTE — Telephone Encounter (Signed)
Thyroid levels were great on last check. She would be due for a physical after February 26 of this year.  I recommend she schedule that.  We can recheck thyroid as well as iodine with her physical labs.

## 2023-11-15 NOTE — Telephone Encounter (Signed)
Spoke with pt relaying Dr Timoteo Expose message. Pt verbalizes understanding. Scheduled CPE on 12/30/23 at 3:30 PM and labs on 12/23/23 at 11:00.

## 2023-12-20 ENCOUNTER — Other Ambulatory Visit: Payer: Self-pay | Admitting: Family Medicine

## 2023-12-20 DIAGNOSIS — Z1231 Encounter for screening mammogram for malignant neoplasm of breast: Secondary | ICD-10-CM

## 2023-12-22 ENCOUNTER — Other Ambulatory Visit: Payer: Self-pay | Admitting: Family Medicine

## 2023-12-22 DIAGNOSIS — E038 Other specified hypothyroidism: Secondary | ICD-10-CM

## 2023-12-22 DIAGNOSIS — E559 Vitamin D deficiency, unspecified: Secondary | ICD-10-CM

## 2023-12-22 DIAGNOSIS — R5382 Chronic fatigue, unspecified: Secondary | ICD-10-CM

## 2023-12-22 DIAGNOSIS — E785 Hyperlipidemia, unspecified: Secondary | ICD-10-CM

## 2023-12-22 DIAGNOSIS — R7989 Other specified abnormal findings of blood chemistry: Secondary | ICD-10-CM

## 2023-12-22 DIAGNOSIS — E618 Deficiency of other specified nutrient elements: Secondary | ICD-10-CM

## 2023-12-23 ENCOUNTER — Other Ambulatory Visit: Payer: Managed Care, Other (non HMO)

## 2023-12-23 ENCOUNTER — Encounter: Payer: Managed Care, Other (non HMO) | Attending: Psychology | Admitting: Psychology

## 2023-12-23 ENCOUNTER — Other Ambulatory Visit (INDEPENDENT_AMBULATORY_CARE_PROVIDER_SITE_OTHER)

## 2023-12-23 DIAGNOSIS — F09 Unspecified mental disorder due to known physiological condition: Secondary | ICD-10-CM | POA: Diagnosis present

## 2023-12-23 DIAGNOSIS — R5382 Chronic fatigue, unspecified: Secondary | ICD-10-CM

## 2023-12-23 DIAGNOSIS — E559 Vitamin D deficiency, unspecified: Secondary | ICD-10-CM

## 2023-12-23 DIAGNOSIS — E618 Deficiency of other specified nutrient elements: Secondary | ICD-10-CM

## 2023-12-23 DIAGNOSIS — E038 Other specified hypothyroidism: Secondary | ICD-10-CM

## 2023-12-23 DIAGNOSIS — F410 Panic disorder [episodic paroxysmal anxiety] without agoraphobia: Secondary | ICD-10-CM | POA: Diagnosis present

## 2023-12-23 DIAGNOSIS — G479 Sleep disorder, unspecified: Secondary | ICD-10-CM

## 2023-12-23 DIAGNOSIS — R7989 Other specified abnormal findings of blood chemistry: Secondary | ICD-10-CM

## 2023-12-23 DIAGNOSIS — F900 Attention-deficit hyperactivity disorder, predominantly inattentive type: Secondary | ICD-10-CM | POA: Diagnosis present

## 2023-12-23 DIAGNOSIS — E785 Hyperlipidemia, unspecified: Secondary | ICD-10-CM

## 2023-12-23 NOTE — Addendum Note (Signed)
 Addended by: Alvina Chou on: 12/23/2023 12:51 PM   Modules accepted: Orders

## 2023-12-23 NOTE — Addendum Note (Signed)
 Addended by: Alvina Chou on: 12/23/2023 12:50 PM   Modules accepted: Orders

## 2023-12-24 LAB — CBC WITH DIFFERENTIAL/PLATELET
Basophils Absolute: 0 10*3/uL (ref 0.0–0.2)
Basos: 0 %
EOS (ABSOLUTE): 0.1 10*3/uL (ref 0.0–0.4)
Eos: 1 %
Hematocrit: 39.8 % (ref 34.0–46.6)
Hemoglobin: 13 g/dL (ref 11.1–15.9)
Immature Grans (Abs): 0 10*3/uL (ref 0.0–0.1)
Immature Granulocytes: 0 %
Lymphocytes Absolute: 2.3 10*3/uL (ref 0.7–3.1)
Lymphs: 47 %
MCH: 29.4 pg (ref 26.6–33.0)
MCHC: 32.7 g/dL (ref 31.5–35.7)
MCV: 90 fL (ref 79–97)
Monocytes Absolute: 0.5 10*3/uL (ref 0.1–0.9)
Monocytes: 11 %
Neutrophils Absolute: 2.1 10*3/uL (ref 1.4–7.0)
Neutrophils: 41 %
Platelets: 271 10*3/uL (ref 150–450)
RBC: 4.42 x10E6/uL (ref 3.77–5.28)
RDW: 12.7 % (ref 11.7–15.4)
WBC: 5 10*3/uL (ref 3.4–10.8)

## 2023-12-24 LAB — IRON AND TIBC
Iron Saturation: 37 % (ref 15–55)
Iron: 108 ug/dL (ref 27–159)
Total Iron Binding Capacity: 293 ug/dL (ref 250–450)
UIBC: 185 ug/dL (ref 131–425)

## 2023-12-24 LAB — TSH: TSH: 0.216 u[IU]/mL — ABNORMAL LOW (ref 0.450–4.500)

## 2023-12-24 LAB — LIPID PANEL
Chol/HDL Ratio: 2.7 ratio (ref 0.0–4.4)
Cholesterol, Total: 188 mg/dL (ref 100–199)
HDL: 70 mg/dL (ref >39)
LDL Chol Calc (NIH): 107 mg/dL — ABNORMAL HIGH (ref 0–99)
Triglycerides: 57 mg/dL (ref 0–149)
VLDL Cholesterol Cal: 11 mg/dL (ref 5–40)

## 2023-12-24 LAB — COMPREHENSIVE METABOLIC PANEL
ALT: 13 IU/L (ref 0–32)
AST: 18 IU/L (ref 0–40)
Albumin: 4.6 g/dL (ref 3.8–4.9)
Alkaline Phosphatase: 53 IU/L (ref 44–121)
BUN/Creatinine Ratio: 22 (ref 9–23)
BUN: 13 mg/dL (ref 6–24)
Bilirubin Total: 0.4 mg/dL (ref 0.0–1.2)
CO2: 25 mmol/L (ref 20–29)
Calcium: 10 mg/dL (ref 8.7–10.2)
Chloride: 101 mmol/L (ref 96–106)
Creatinine, Ser: 0.6 mg/dL (ref 0.57–1.00)
Globulin, Total: 2.1 g/dL (ref 1.5–4.5)
Glucose: 90 mg/dL (ref 70–99)
Potassium: 4.3 mmol/L (ref 3.5–5.2)
Sodium: 141 mmol/L (ref 134–144)
Total Protein: 6.7 g/dL (ref 6.0–8.5)
eGFR: 104 mL/min/{1.73_m2} (ref >59)

## 2023-12-24 LAB — VITAMIN D 25 HYDROXY (VIT D DEFICIENCY, FRACTURES): Vit D, 25-Hydroxy: 56.7 ng/mL (ref 30.0–100.0)

## 2023-12-24 LAB — FERRITIN: Ferritin: 288 ng/mL — ABNORMAL HIGH (ref 15–150)

## 2023-12-24 LAB — MAGNESIUM: Magnesium: 1.7 mg/dL (ref 1.6–2.3)

## 2023-12-24 LAB — VITAMIN B12: Vitamin B-12: 533 pg/mL (ref 232–1245)

## 2023-12-24 LAB — T3: T3, Total: 94 ng/dL (ref 71–180)

## 2023-12-24 LAB — T4, FREE: Free T4: 1.13 ng/dL (ref 0.82–1.77)

## 2023-12-25 LAB — IODINE, RANDOM URINE: Iodine, Urine: 30.2 ug/L (ref 28.0–544.0)

## 2023-12-30 ENCOUNTER — Encounter: Payer: Self-pay | Admitting: Family Medicine

## 2023-12-30 ENCOUNTER — Ambulatory Visit (INDEPENDENT_AMBULATORY_CARE_PROVIDER_SITE_OTHER): Payer: Managed Care, Other (non HMO) | Admitting: Family Medicine

## 2023-12-30 VITALS — BP 116/68 | HR 73 | Temp 98.5°F | Ht 64.75 in | Wt 133.1 lb

## 2023-12-30 DIAGNOSIS — Z0000 Encounter for general adult medical examination without abnormal findings: Secondary | ICD-10-CM | POA: Diagnosis not present

## 2023-12-30 DIAGNOSIS — E041 Nontoxic single thyroid nodule: Secondary | ICD-10-CM

## 2023-12-30 DIAGNOSIS — E559 Vitamin D deficiency, unspecified: Secondary | ICD-10-CM

## 2023-12-30 DIAGNOSIS — F900 Attention-deficit hyperactivity disorder, predominantly inattentive type: Secondary | ICD-10-CM

## 2023-12-30 DIAGNOSIS — F331 Major depressive disorder, recurrent, moderate: Secondary | ICD-10-CM

## 2023-12-30 DIAGNOSIS — R87612 Low grade squamous intraepithelial lesion on cytologic smear of cervix (LGSIL): Secondary | ICD-10-CM

## 2023-12-30 DIAGNOSIS — R739 Hyperglycemia, unspecified: Secondary | ICD-10-CM

## 2023-12-30 DIAGNOSIS — E038 Other specified hypothyroidism: Secondary | ICD-10-CM

## 2023-12-30 DIAGNOSIS — Z780 Asymptomatic menopausal state: Secondary | ICD-10-CM

## 2023-12-30 DIAGNOSIS — Z853 Personal history of malignant neoplasm of breast: Secondary | ICD-10-CM

## 2023-12-30 DIAGNOSIS — R7989 Other specified abnormal findings of blood chemistry: Secondary | ICD-10-CM

## 2023-12-30 DIAGNOSIS — F09 Unspecified mental disorder due to known physiological condition: Secondary | ICD-10-CM

## 2023-12-30 DIAGNOSIS — Z148 Genetic carrier of other disease: Secondary | ICD-10-CM

## 2023-12-30 DIAGNOSIS — M810 Age-related osteoporosis without current pathological fracture: Secondary | ICD-10-CM

## 2023-12-30 DIAGNOSIS — R5382 Chronic fatigue, unspecified: Secondary | ICD-10-CM

## 2023-12-30 MED ORDER — THYROID 60 MG PO TABS: 60.0000 mg | ORAL_TABLET | Freq: Every day | ORAL | 6 refills | Status: DC

## 2023-12-30 MED ORDER — VALACYCLOVIR HCL 1 G PO TABS: 2000.0000 mg | ORAL_TABLET | Freq: Two times a day (BID) | ORAL | 1 refills | Status: DC

## 2023-12-30 NOTE — Progress Notes (Unsigned)
 Ph: [phone] Fax: [phone]   Patient ID: Susan Soto, female    DOB: 06-Jun-1966, 58 y.o.   MRN: [id number]  This visit was conducted in person.  BP 116/68   Pulse 73   Temp 98.5 F (36.9 C) (Oral)   Ht 5' 4.75" (1.645 m)   Wt 133 lb 2 oz (60.4 kg)   LMP 03/04/2014   SpO2 96%   BMI 22.32 kg/m    CC: CPE Subjective:   HPI: Susan Soto is a 58 y.o. female presenting on 12/30/2023 for Annual Exam   Sees psychiatrist (Dr Kara Dies) and Dr Kieth Brightly neuropsychologist for cognitive and neurobehavioral dysfunction, circadian rhythm sleep disorder, chronic fatigue, hypothyroidism, MDD, GAD with panic disorder and ADHD. Released from sleep doctor (Dohmeier). established with new therapist Research scientist (physical sciences) at [facility] in [place].   40 lbs down in 2023-2024, maintaining - continues following low carb diet. She is off refined sugar, limiting gluten in diet.   Struggles with ongoing chronic fatigue ADHD - meds have become less effective.  Continues armour thyroid 60mg  daily.  She notes pill is easy to cut in half if needed.  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  Mother had polycythemia.   Preventative: COLONOSCOPY WITH PROPOFOL 10/06/2019 - WNL, rpt 10 yrs [facility]) Well woman - mother dx with cervical cancer 40s. Pap smear normal 08/2018, 09/2020. Benign endometrial biopsy for post menopausal bleeding ~2018. Last pap LSIL 12/2022 referred to GYN s/p colposcopy 01/2023 rec rpt pap 1 year.  Mammogram 11/2022 Birads1 @ [place]. H/o L breast cancer s/p radiation and chemotherapy 2009 - yearly mammograms. Dr Lemar Livings retired. Per his latest note: "stable breast exam now 13 years post wide excision and whole breast radiation. Routine annual screening mammograms with PCP is appropriate"  LMP - 02/2014. Postmenopausal at age 89yo.  DEXA 03/2022 - T -2.6 L femur neck. Continues vit D 4000 IU daily. Discussed calcium in diet.  Lung cancer screening - not eligible  Flu yearly - declines this  year COVID vaccine - discussed, declines  Td 2003, Tdap 2013, 11/2021 Shingrix - 09/2019, 11/2019  Seat belt use discussed.  Sunscreen use discussed.  No changing moles.  Non smoker.  Alcohol - none.  Dentist q6 mo  Eye exam - yearly   Caffeine: 1 cup coffee in am  Lives with husband and 1 son, daughter in college, 1 dog and 1 cat, 5-6 outside cats  Occupation: Writer job  Activity: no regular exercise Diet: joined [employer], good water, fruits/vegetables daily, red meat 3x/wk, fish 2x/wk, more stevia and less regular sugar. KetoDiet since 10/2021.      Relevant past medical, surgical, family and social history reviewed and updated as indicated. Interim medical history since our last visit reviewed. Allergies and medications reviewed and updated. Outpatient Medications Prior to Visit  Medication Sig Dispense Refill   amphetamine-dextroamphetamine (ADDERALL) 20 MG tablet Take 0.5-1 tablets (10-20 mg total) by mouth daily as needed.     Ascorbic Acid (VITAMIN C) POWD Take 1,000 mg by mouth daily.     ASHWAGANDHA PO Take 375 mg by mouth at bedtime.     B Complex Vitamins (VITAMIN B COMPLEX PO) Take by mouth daily.     Cholecalciferol 100 MCG (4000 UT) CAPS Take 1 capsule (4,000 Units total) by mouth daily.     COLLAGEN PO Take 1 tablet by mouth daily.     dexmethylphenidate (FOCALIN) 10 MG tablet Take 10 mg by mouth every morning.  dextroamphetamine (ZENZEDI) 10 MG tablet Take 1 tablet (10 mg total) by mouth daily.     diazepam (VALIUM) 5 MG tablet Take 5 mg by mouth at bedtime.     medium chain triglycerides (MCT OIL) oil Take 15 mLs by mouth daily. Takes 3 TBSP daily     Omega-3 Fatty Acids (OMEGA 3 PO) Take 690 mg by mouth daily. Nordic naturals     OVER THE COUNTER MEDICATION 2 capsules daily. KETO- metabolic ketosis support     Probiotic Product (PROBIOTIC DAILY PO) Take by mouth daily.     thyroid (ARMOUR THYROID) 60 MG tablet Take 1 tablet (60 mg total) by  mouth daily before breakfast. 30 tablet 6   valACYclovir (VALTREX) 1000 MG tablet Take 2 tablets (2,000 mg total) by mouth in the morning and at bedtime. 20 tablet 1   Multiple Vitamins-Minerals (ANTIOXIDANT FORMULA SG) capsule Take 1 capsule by mouth daily. Powder daily (Patient not taking: Reported on 12/30/2023)     No facility-administered medications prior to visit.     Per HPI unless specifically indicated in ROS section below Review of Systems  Constitutional:  Negative for activity change, appetite change, chills, fatigue, fever and unexpected weight change.  HENT:  Negative for hearing loss.   Eyes:  Negative for visual disturbance.  Respiratory:  Negative for cough, chest tightness, shortness of breath and wheezing.   Cardiovascular:  Negative for chest pain, palpitations and leg swelling.  Gastrointestinal:  Positive for constipation (managed with diet and oil). Negative for abdominal distention, abdominal pain, blood in stool, diarrhea, nausea and vomiting.  Endocrine: Positive for cold intolerance.  Genitourinary:  Negative for difficulty urinating and hematuria.  Musculoskeletal:  Negative for arthralgias, myalgias and neck pain.  Skin:  Negative for rash.  Neurological:  Negative for dizziness, seizures, syncope and headaches.  Hematological:  Negative for adenopathy. Bruises/bleeds easily.  Psychiatric/Behavioral:  Positive for dysphoric mood. The patient is nervous/anxious.        ADHD    Objective:  BP 116/68   Pulse 73   Temp 98.5 F (36.9 C) (Oral)   Ht 5' 4.75" (1.645 m)   Wt 133 lb 2 oz (60.4 kg)   LMP 03/04/2014   SpO2 96%   BMI 22.32 kg/m   Wt Readings from Last 3 Encounters:  12/30/23 133 lb 2 oz (60.4 kg)  06/17/23 133 lb (60.3 kg)  02/07/23 135 lb 1.6 oz (61.3 kg)    Ht Readings from Last 3 Encounters:  12/30/23 5' 4.75" (1.645 m)  02/07/23 5' 5.5" (1.664 m)  12/17/22 5' 5.25" (1.657 m)      Physical Exam Vitals and nursing note reviewed.   Constitutional:      Appearance: Normal appearance. She is not ill-appearing.  HENT:     Head: Normocephalic and atraumatic.     Right Ear: Tympanic membrane, ear canal and external ear normal. There is no impacted cerumen.     Left Ear: Tympanic membrane, ear canal and external ear normal. There is no impacted cerumen.     Mouth/Throat:     Mouth: Mucous membranes are moist.     Pharynx: Oropharynx is clear. No oropharyngeal exudate or posterior oropharyngeal erythema.  Eyes:     General:        Right eye: No discharge.        Left eye: No discharge.     Extraocular Movements: Extraocular movements intact.     Conjunctiva/sclera: Conjunctivae normal.     Pupils:  Pupils are equal, round, and reactive to light.  Neck:     Thyroid: No thyroid mass or thyromegaly.  Cardiovascular:     Rate and Rhythm: Normal rate and regular rhythm.     Pulses: Normal pulses.     Heart sounds: Normal heart sounds. No murmur heard. Pulmonary:     Effort: Pulmonary effort is normal. No respiratory distress.     Breath sounds: Normal breath sounds. No wheezing, rhonchi or rales.  Abdominal:     General: Bowel sounds are normal. There is no distension.     Palpations: Abdomen is soft. There is no mass.     Tenderness: There is no abdominal tenderness. There is no guarding or rebound.     Hernia: No hernia is present.  Musculoskeletal:     Cervical back: Normal range of motion and neck supple. No rigidity.     Right lower leg: No edema.     Left lower leg: No edema.  Lymphadenopathy:     Cervical: No cervical adenopathy.  Skin:    General: Skin is warm and dry.     Findings: No rash.  Neurological:     General: No focal deficit present.     Mental Status: She is alert. Mental status is at baseline.  Psychiatric:        Mood and Affect: Mood normal.        Behavior: Behavior normal.       Results for orders placed or performed in visit on 12/23/23  Iron and TIBC   Collection Time: 12/23/23  12:53 PM  Result Value Ref Range   Total Iron Binding Capacity 293 250 - 450 ug/dL   UIBC 865 784 - 696 ug/dL   Iron 295 27 - 284 ug/dL   Iron Saturation 37 15 - 55 %  VITAMIN D 25 Hydroxy (Vit-D Deficiency, Fractures)   Collection Time: 12/23/23 12:53 PM  Result Value Ref Range   Vit D, 25-Hydroxy 56.7 30.0 - 100.0 ng/mL  Ferritin   Collection Time: 12/23/23 12:53 PM  Result Value Ref Range   Ferritin 288 (H) 15 - 150 ng/mL  TSH   Collection Time: 12/23/23 12:53 PM  Result Value Ref Range   TSH 0.216 (L) 0.450 - 4.500 uIU/mL  Comprehensive metabolic panel   Collection Time: 12/23/23 12:53 PM  Result Value Ref Range   Glucose 90 70 - 99 mg/dL   BUN 13 6 - 24 mg/dL   Creatinine, Ser 1.32 0.57 - 1.00 mg/dL   eGFR 440 >10 UV/OZD/6.64   BUN/Creatinine Ratio 22 9 - 23   Sodium 141 134 - 144 mmol/L   Potassium 4.3 3.5 - 5.2 mmol/L   Chloride 101 96 - 106 mmol/L   CO2 25 20 - 29 mmol/L   Calcium 10.0 8.7 - 10.2 mg/dL   Total Protein 6.7 6.0 - 8.5 g/dL   Albumin 4.6 3.8 - 4.9 g/dL   Globulin, Total 2.1 1.5 - 4.5 g/dL   Bilirubin Total 0.4 0.0 - 1.2 mg/dL   Alkaline Phosphatase 53 44 - 121 IU/L   AST 18 0 - 40 IU/L   ALT 13 0 - 32 IU/L  CBC with Differential/Platelet   Collection Time: 12/23/23 12:53 PM  Result Value Ref Range   WBC 5.0 3.4 - 10.8 x10E3/uL   RBC 4.42 3.77 - 5.28 x10E6/uL   Hemoglobin 13.0 11.1 - 15.9 g/dL   Hematocrit 40.3 47.4 - 46.6 %   MCV 90 79 - 97 fL  MCH 29.4 26.6 - 33.0 pg   MCHC 32.7 31.5 - 35.7 g/dL   RDW 16.1 09.6 - 04.5 %   Platelets 271 150 - 450 x10E3/uL   Neutrophils 41 Not Estab. %   Lymphs 47 Not Estab. %   Monocytes 11 Not Estab. %   Eos 1 Not Estab. %   Basos 0 Not Estab. %   Neutrophils Absolute 2.1 1.4 - 7.0 x10E3/uL   Lymphocytes Absolute 2.3 0.7 - 3.1 x10E3/uL   Monocytes Absolute 0.5 0.1 - 0.9 x10E3/uL   EOS (ABSOLUTE) 0.1 0.0 - 0.4 x10E3/uL   Basophils Absolute 0.0 0.0 - 0.2 x10E3/uL   Immature Granulocytes 0 Not Estab. %    Immature Grans (Abs) 0.0 0.0 - 0.1 x10E3/uL  T4, free   Collection Time: 12/23/23 12:53 PM  Result Value Ref Range   Free T4 1.13 0.82 - 1.77 ng/dL  Lipid panel   Collection Time: 12/23/23 12:53 PM  Result Value Ref Range   Cholesterol, Total 188 100 - 199 mg/dL   Triglycerides 57 0 - 149 mg/dL   HDL 70 >40 mg/dL   VLDL Cholesterol Cal 11 5 - 40 mg/dL   LDL Chol Calc (NIH) 981 (H) 0 - 99 mg/dL   Chol/HDL Ratio 2.7 0.0 - 4.4 ratio  Iodine, Random Urine   Collection Time: 12/23/23 12:53 PM  Result Value Ref Range   Iodine, Urine 30.2 28.0 - 544.0 ug/L  T3   Collection Time: 12/23/23 12:53 PM  Result Value Ref Range   T3, Total 94 71 - 180 ng/dL  Magnesium   Collection Time: 12/23/23 12:53 PM  Result Value Ref Range   Magnesium 1.7 1.6 - 2.3 mg/dL  Vitamin X91   Collection Time: 12/23/23 12:53 PM  Result Value Ref Range   Vitamin B-12 533 232 - 1,245 pg/mL    Assessment & Plan:   Problem List Items Addressed This Visit     Healthcare maintenance - Primary (Chronic)   Preventative protocols reviewed and updated unless pt declined. Discussed healthy diet and lifestyle.       Subclinical hypothyroidism   TSH was low however T3 and free T4 were normal.  She denies hyperthyroid symptoms. Discussed difficulty with titrating Armour Thyroid dosing given few options available. Repeat TSH and if persistently low, low threshold to lower thyroid dosing.      Relevant Medications   thyroid (ARMOUR THYROID) 60 MG tablet   Other Relevant Orders   TSH   MDD (major depressive disorder), recurrent episode, moderate (HCC)   Chronic, followed by psychiatry.      Attention deficit hyperactivity disorder (ADHD), predominantly inattentive type   Chronic, followed by psychiatry.  She notes decreased effectiveness of stimulants in general.      Postmenopausal   Vitamin D deficiency   Continue vitamin D 4000 units daily replacement.      Personal history of breast cancer    Continue yearly mammograms.      Chronic fatigue   Ongoing difficulty. Labs overall unrevealing .       Thyroid nodule   Update thyroid ultrasound and if stable, this will document 5 years of stability of thyroid isthmus nodule.      Relevant Medications   thyroid (ARMOUR THYROID) 60 MG tablet   Other Relevant Orders   US THYROID   Cognitive and neurobehavioral dysfunction   Regularly sees psychiatry and neuropsychology.       Osteoporosis   Discussed calcium and vitamin D regular intake  as well as regular weightbearing exercises. She gets most calcium from her diet. Will order updated DEXA to be done around June 2025.      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  Will return next month for updated pap smear and if abnormal will refer back to GYN.       Elevated ferritin   Chronic issue, some improvement this year.  She is hemochromatosis carrier.  Continue to monitor.       Hemochromatosis carrier   Other Visit Diagnoses       Hyperglycemia       Relevant Orders   Hemoglobin A1c        Meds ordered this encounter  Medications   thyroid (ARMOUR THYROID) 60 MG tablet    Sig: Take 1 tablet (60 mg total) by mouth daily before breakfast.    Dispense:  30 tablet    Refill:  6    If not covered by insurance pt will pay out of pocket   valACYclovir (VALTREX) 1000 MG tablet    Sig: Take 2 tablets (2,000 mg total) by mouth in the morning and at bedtime.    Dispense:  20 tablet    Refill:  1    Orders Placed This Encounter  Procedures   US THYROID    Standing Status:   Future    Expiration Date:   12/29/2024    Reason for Exam (SYMPTOM  OR DIAGNOSIS REQUIRED):   f/u thyroid nodule    Preferred imaging location?:   [facility]   DG Bone Density    Standing Status:   Future    Expiration Date:   12/30/2024    Reason for Exam (SYMPTOM  OR DIAGNOSIS REQUIRED):   f/u osteoporosis     Is patient pregnant?:   No    Preferred imaging location?:   [facility]   TSH   Hemoglobin A1c    Patient Instructions  Labs today  You will be due for repeat pap smear next month - schedule pap next month You will be due for repeat bone density scan after 03/2024.   Try to get most or all of your calcium from your food--aim for 1200 mg/day  To figure out dietary calcium: 300 mg/day from all non dairy foods plus 300 mg per cup of milk, other dairy, or fortified juice. Non dairy foods that contain calcium: Kale, oranges, sardines, oatmeal, soy milk/soybeans, salmon, white beans, dried figs, turnip greens, almonds, broccoli, tofu.   Will order thyroid ultrasound Return in 6 months for follow up visit.   Follow up plan: Return in about 6 months (around 07/01/2024) for follow up visit.  Eustaquio Boyden, MD

## 2023-12-30 NOTE — Patient Instructions (Addendum)
 Labs today  You will be due for repeat pap smear next month - schedule pap next month You will be due for repeat bone density scan after 03/2024.   Try to get most or all of your calcium from your food--aim for 1200 mg/day  To figure out dietary calcium: 300 mg/day from all non dairy foods plus 300 mg per cup of milk, other dairy, or fortified juice. Non dairy foods that contain calcium: Kale, oranges, sardines, oatmeal, soy milk/soybeans, salmon, white beans, dried figs, turnip greens, almonds, broccoli, tofu.   Will order thyroid ultrasound Return in 6 months for follow up visit.

## 2023-12-30 NOTE — Assessment & Plan Note (Signed)
 Preventative protocols reviewed and updated unless pt declined. Discussed healthy diet and lifestyle.

## 2023-12-31 LAB — TSH: TSH: 0.332 u[IU]/mL — ABNORMAL LOW (ref 0.450–4.500)

## 2023-12-31 LAB — HEMOGLOBIN A1C
Est. average glucose Bld gHb Est-mCnc: 111 mg/dL
Hgb A1c MFr Bld: 5.5 % (ref 4.8–5.6)

## 2023-12-31 NOTE — Assessment & Plan Note (Signed)
 Ongoing difficulty. Labs overall unrevealing .

## 2023-12-31 NOTE — Assessment & Plan Note (Signed)
Continue yearly mammograms 

## 2023-12-31 NOTE — Assessment & Plan Note (Addendum)
 TSH was low however T3 and free T4 were normal.  She denies hyperthyroid symptoms. Discussed difficulty with titrating Armour Thyroid dosing given few options available. Repeat TSH and if persistently low, low threshold to lower thyroid dosing.

## 2023-12-31 NOTE — Assessment & Plan Note (Signed)
 Update thyroid ultrasound and if stable, this will document 5 years of stability of thyroid isthmus nodule.

## 2023-12-31 NOTE — Assessment & Plan Note (Signed)
 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  Will return next month for updated pap smear and if abnormal will refer back to GYN.

## 2023-12-31 NOTE — Assessment & Plan Note (Addendum)
 Regularly sees psychiatry and neuropsychology.

## 2023-12-31 NOTE — Assessment & Plan Note (Signed)
Chronic, followed by psychiatry 

## 2023-12-31 NOTE — Assessment & Plan Note (Signed)
 Chronic issue, some improvement this year.  She is hemochromatosis carrier.  Continue to monitor.

## 2023-12-31 NOTE — Assessment & Plan Note (Signed)
 Continue vitamin D 4000 units daily replacement.

## 2023-12-31 NOTE — Assessment & Plan Note (Signed)
 Chronic, followed by psychiatry.  She notes decreased effectiveness of stimulants in general.

## 2023-12-31 NOTE — Assessment & Plan Note (Signed)
 Discussed calcium and vitamin D regular intake as well as regular weightbearing exercises. She gets most calcium from her diet. Will order updated DEXA to be done around June 2025.

## 2024-01-01 ENCOUNTER — Other Ambulatory Visit (HOSPITAL_COMMUNITY): Payer: Self-pay

## 2024-01-02 ENCOUNTER — Encounter: Payer: Self-pay | Admitting: Family Medicine

## 2024-01-02 ENCOUNTER — Ambulatory Visit
Admission: RE | Admit: 2024-01-02 | Discharge: 2024-01-02 | Disposition: A | Discharge status: Home / Self Care | Source: Ambulatory Visit | Attending: Family Medicine | Admitting: Family Medicine

## 2024-01-02 ENCOUNTER — Other Ambulatory Visit: Payer: Self-pay | Admitting: Family Medicine

## 2024-01-02 DIAGNOSIS — E041 Nontoxic single thyroid nodule: Secondary | ICD-10-CM | POA: Insufficient documentation

## 2024-01-02 DIAGNOSIS — E038 Other specified hypothyroidism: Secondary | ICD-10-CM

## 2024-01-02 MED ORDER — THYROID 60 MG PO TABS: 60.0000 mg | ORAL_TABLET | Freq: Every day | ORAL | 11 refills | Status: AC

## 2024-01-07 ENCOUNTER — Ambulatory Visit: Payer: Managed Care, Other (non HMO) | Admitting: Psychology

## 2024-01-07 ENCOUNTER — Ambulatory Visit
Admission: RE | Admit: 2024-01-07 | Discharge: 2024-01-07 | Disposition: A | Discharge status: Home / Self Care | Payer: Managed Care, Other (non HMO) | Source: Ambulatory Visit | Attending: Family Medicine | Admitting: Family Medicine

## 2024-01-07 DIAGNOSIS — Z1231 Encounter for screening mammogram for malignant neoplasm of breast: Secondary | ICD-10-CM | POA: Diagnosis present

## 2024-01-14 ENCOUNTER — Encounter: Payer: Self-pay | Admitting: Family Medicine

## 2024-02-04 ENCOUNTER — Other Ambulatory Visit (HOSPITAL_COMMUNITY)
Admission: RE | Admit: 2024-02-04 | Discharge: 2024-02-04 | Disposition: A | Discharge status: Home / Self Care | Source: Ambulatory Visit | Attending: Family Medicine | Admitting: Family Medicine

## 2024-02-04 ENCOUNTER — Encounter: Payer: Self-pay | Admitting: Family Medicine

## 2024-02-04 ENCOUNTER — Ambulatory Visit (INDEPENDENT_AMBULATORY_CARE_PROVIDER_SITE_OTHER): Admitting: Family Medicine

## 2024-02-04 VITALS — BP 130/80 | HR 83 | Temp 98.6°F | Ht 64.75 in | Wt 131.4 lb

## 2024-02-04 DIAGNOSIS — R87612 Low grade squamous intraepithelial lesion on cytologic smear of cervix (LGSIL): Secondary | ICD-10-CM | POA: Diagnosis not present

## 2024-02-04 DIAGNOSIS — Z124 Encounter for screening for malignant neoplasm of cervix: Secondary | ICD-10-CM | POA: Diagnosis not present

## 2024-02-04 DIAGNOSIS — Z01419 Encounter for gynecological examination (general) (routine) without abnormal findings: Secondary | ICD-10-CM

## 2024-02-04 DIAGNOSIS — E038 Other specified hypothyroidism: Secondary | ICD-10-CM

## 2024-02-04 DIAGNOSIS — N951 Menopausal and female climacteric states: Secondary | ICD-10-CM

## 2024-02-04 DIAGNOSIS — Z1151 Encounter for screening for human papillomavirus (HPV): Secondary | ICD-10-CM | POA: Insufficient documentation

## 2024-02-04 DIAGNOSIS — Z780 Asymptomatic menopausal state: Secondary | ICD-10-CM | POA: Diagnosis not present

## 2024-02-04 DIAGNOSIS — F900 Attention-deficit hyperactivity disorder, predominantly inattentive type: Secondary | ICD-10-CM | POA: Diagnosis not present

## 2024-02-04 NOTE — Assessment & Plan Note (Addendum)
 Refer to GYN for symptomatic post-menopausal state to discuss topical hormone replacement therapy options - pt interested.  Evidence of vaginal atrophy and possible urethral caruncle.  She also has questions about topical testosterone. Personal hx breast cancer (2009)

## 2024-02-04 NOTE — Patient Instructions (Addendum)
 Recheck TSH today.  Pap smear today.  We will refer you to GYN to discuss post-menopausal hormone treatment options.

## 2024-02-04 NOTE — Assessment & Plan Note (Signed)
 TSH remained low, T3/fT4 normal range - armour thyroid 60mg  daily decreased to once weekly taking 1/2 tablet. Update TSH today.

## 2024-02-04 NOTE — Assessment & Plan Note (Signed)
 Chronic, managed by psych on stimulants. Notes worsened ADHD since menopause.

## 2024-02-04 NOTE — Assessment & Plan Note (Signed)
 Colposcopy 01/2023 - CIN1. Repeat pap smear today, return to GYN if abnormal.

## 2024-02-04 NOTE — Progress Notes (Signed)
 Ph: [phone] Fax: [phone]   Patient ID: Susan Soto, female    DOB: [birth year], 58 y.o.   MRN: [id number]  This visit was conducted in person.  BP 130/80   Pulse 83   Temp 98.6 F (37 C) (Oral)   Ht 5' 4.75" (1.645 m)   Wt 131 lb 6 oz (59.6 kg)   LMP 03/04/2014   SpO2 93%   BMI 22.03 kg/m    CC: pap smear today Subjective:   HPI: Susan Soto is a 58 y.o. female presenting on 02/04/2024 for Follow-up (Here for Pap smear. Did not have at CPE. )   Maternal h/o cervical cancer - dx in her 67s.  Well woman exam - s/p benign endometrial biopsy for postmenopausal bleeding ~2018.  Pap smear normal 08/2018, 09/2020.  Pap smear 12/2022 returned LSIL -  referred to GYN Dr Hildred Laser s/p Colposcopy 01/2023 showing: Part A-Endocervical Curettings: MINUTE FRAGMENTS OF BENIGN ENDOCERVICAL GLANDS, MUCUS, AND BLOOD. Part B-Cervical Biopsy,5:00: LOW-GRADE SQUAMOUS INTRAEPITHELIAL LESION (CIN 1).  Rec rpt pap 1 year.   LMP 02/2014 - postmenopausal at age 14 yo.  DEXA 03/2022 - T -2.6 L femur neck. Continues vit D 4000 IU daily and dietary calcium. Rpt due 03/2024.   Hypothyroidism:  Continues armour thyroid 60mg  daily with 1d/wk 1/2 tablet Last visit TSH low - due for recheck  Notes dyspareunia despite lubricating products.  Notes ADHD has worsened after menopause.   She is interested in discussing HRT options including testosterone with GYN - will refer locally.   H/o L breast cancer s/p wide excision and radiation/chemotherapy 2009.      Relevant past medical, surgical, family and social history reviewed and updated as indicated. Interim medical history since our last visit reviewed. Allergies and medications reviewed and updated. Outpatient Medications Prior to Visit  Medication Sig Dispense Refill   amphetamine-dextroamphetamine (ADDERALL) 20 MG tablet Take 0.5-1 tablets (10-20 mg total) by mouth daily as needed.     Ascorbic Acid (VITAMIN C) POWD Take 1,000 mg by  mouth daily.     ASHWAGANDHA PO Take 375 mg by mouth at bedtime.     B Complex Vitamins (VITAMIN B COMPLEX PO) Take by mouth daily.     Cholecalciferol 100 MCG (4000 UT) CAPS Take 1 capsule (4,000 Units total) by mouth daily.     COLLAGEN PO Take 1 tablet by mouth daily.     dexmethylphenidate (FOCALIN) 10 MG tablet Take 10 mg by mouth every morning.     dextroamphetamine (ZENZEDI) 10 MG tablet Take 1 tablet (10 mg total) by mouth daily.     diazepam (VALIUM) 5 MG tablet Take 5 mg by mouth at bedtime.     medium chain triglycerides (MCT OIL) oil Take 15 mLs by mouth daily. Takes 3 TBSP daily     Multiple Vitamins-Minerals (ANTIOXIDANT FORMULA SG) capsule Take 1 capsule by mouth daily. Powder daily     Omega-3 Fatty Acids (OMEGA 3 PO) Take 690 mg by mouth daily. Nordic naturals     OVER THE COUNTER MEDICATION 2 capsules daily. KETO- metabolic ketosis support     Probiotic Product (PROBIOTIC DAILY PO) Take by mouth daily.     thyroid (ARMOUR THYROID) 60 MG tablet Take 1 tablet (60 mg total) by mouth daily before breakfast. One day a week take 1/2 tablet only 30 tablet 11   valACYclovir (VALTREX) 1000 MG tablet Take 2 tablets (2,000 mg total) by mouth in the morning and at  bedtime. 20 tablet 1   No facility-administered medications prior to visit.     Per HPI unless specifically indicated in ROS section below Review of Systems  Objective:  BP 130/80   Pulse 83   Temp 98.6 F (37 C) (Oral)   Ht 5' 4.75" (1.645 m)   Wt 131 lb 6 oz (59.6 kg)   LMP 03/04/2014   SpO2 93%   BMI 22.03 kg/m   Wt Readings from Last 3 Encounters:  02/04/24 131 lb 6 oz (59.6 kg)  12/30/23 133 lb 2 oz (60.4 kg)  06/17/23 133 lb (60.3 kg)      Physical Exam Vitals and nursing note reviewed. Exam conducted with a chaperone present.  Constitutional:      Appearance: Normal appearance.  Genitourinary:    Exam position: Lithotomy position.     Pubic Area: No rash.      Labia:        Right: No rash or  tenderness.        Left: No rash or tenderness.      Urethra: Urethral pain present.     Vagina: Tenderness present. No vaginal discharge or bleeding.     Cervix: Normal.     Uterus: Normal.        Comments:  Mild erythema at urethral opening Smooth vaginal tissue Tender induration at 7 o clock vaginal opening  Neurological:     Mental Status: She is alert.       Results for orders placed or performed in visit on 12/30/23  TSH   Collection Time: 12/30/23  4:23 PM  Result Value Ref Range   TSH 0.332 (L) 0.450 - 4.500 uIU/mL  Hemoglobin A1c   Collection Time: 12/30/23  4:23 PM  Result Value Ref Range   Hgb A1c MFr Bld 5.5 4.8 - 5.6 %   Est. average glucose Bld gHb Est-mCnc 111 mg/dL    Assessment & Plan:   Problem List Items Addressed This Visit     Subclinical hypothyroidism - Primary   TSH remained low, T3/fT4 normal range - armour thyroid 60mg  daily decreased to once weekly taking 1/2 tablet. Update TSH today.       Relevant Orders   TSH   Attention deficit hyperactivity disorder (ADHD), predominantly inattentive type   Chronic, managed by psych on stimulants. Notes worsened ADHD since menopause.       Postmenopausal   Relevant Orders   Ambulatory referral to Gynecology   Postmenopausal disorder   Refer to GYN for symptomatic post-menopausal state to discuss topical hormone replacement therapy options - pt interested.  Evidence of vaginal atrophy and possible urethral caruncle.  She also has questions about topical testosterone. Personal hx breast cancer (2009)      Relevant Orders   Ambulatory referral to Gynecology   Abnormal Pap smear of cervix   Colposcopy 01/2023 - CIN1. Repeat pap smear today, return to GYN if abnormal.       Relevant Orders   Ambulatory referral to Gynecology   Other Visit Diagnoses       Encounter for annual routine gynecological examination       Relevant Orders   Cytology - PAP        No orders of the defined types  were placed in this encounter.   Orders Placed This Encounter  Procedures   TSH   Ambulatory referral to Gynecology    Referral Priority:   Routine    Referral Type:   Consultation  Referral Reason:   Specialty Services Required    Requested Specialty:   Gynecology    Number of Visits Requested:   1    Patient Instructions  Recheck TSH today.  Pap smear today.  We will refer you to GYN to discuss post-menopausal hormone treatment options.   Follow up plan: Return if symptoms worsen or fail to improve.  Claire Crick, MD

## 2024-02-05 ENCOUNTER — Encounter: Payer: Self-pay | Admitting: Psychology

## 2024-02-05 LAB — TSH: TSH: 0.435 u[IU]/mL — ABNORMAL LOW (ref 0.450–4.500)

## 2024-02-05 NOTE — Progress Notes (Signed)
 Neuropsychology Visit  Patient:  Susan Soto   DOB: 23-Nov-1965  MR Number: [id number]  Location: [facility] [place] [facility] [address] [place], [address] [place] [address] Dept: [phone]  Date of Service: 12/23/2023  Start: 3 PM End: 4 PM  Duration of Service: 1 Hour  Today's visit was an in person visit that was conducted in my outpatient clinic office with the patient myself present.  Provider/Observer:     Hershal Coria PsyD  Chief Complaint:      Chief Complaint  Patient presents with   Anxiety   Depression   ADHD   Sleeping Problem   Panic Attack    Reason For Service:     Susan Soto is a 58 year old female referred by Merlyn Albert, MD with [facility].  The patient has a history of sleep disturbance with reports of cognitive disturbance and bilateral carpal tunnel syndrome.  The patient has been out of work after taking a medical leave in 2017 but then returning to work in a different job.  The patient has a history of major depressive disorder, adult residual attention deficit disorder and potentially residual cognitive difficulties after being diagnosed and treated for breast cancer in 2009 with both chemotherapy and radiation.  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  The patient reports that she also had significant issues with major depressive disorder.  She reports that she took a medical leave of absence in 2017 after developing significant exacerbation of her major depression  with her mother passing away.  The patient returned to work after couple of months with an opportunity for a different job that had less demands and lower caseload.  Patient reports that she continued to have difficulty keeping up with any kind of paperwork and her previous strategies for dealing with that were not very helpful.  The patient reports that if she is not taking any medications that she just stays in bed, has no energy and significant difficulty with motivation.  The patient reports that this was true even when she was a young child.   The patient reports that she had an exacerbation of her difficulties after her chemotherapy treatment for breast cancer.  The patient reports that initially she experienced "chemo brain" but got better and then in 2017 her symptoms returned and she deteriorated.  The patient reports that typically she would have times of "hyperfocus" to get caught up now she cannot "hyperfocus" and has difficulty keeping up with paperwork.  The patient reports that she made improvements while out on FMLA after her mother and close aunt died.   The patient reports that she has seen a psychiatrist for 20 years in [place].   Patient reports being extremely stressed at work and feeling like she is extremely behind on her caseload and paperwork.  Her job has allowed her to start coming in at 10 AM to help her get better sleep.  Even with this adjustment the patient has difficulty getting into work before 11 AM.  The patient reports that she cant get her thoughts focused  enough to do anything even at home.  She is trying to work long enough to retire early.    The patient is not sleeping well but no significant findings on sleep study beyond delayed REM onset.  The patient had been on extreme low fat diet for many years but has stopped this diet.     The patient now has sudden onset of anxiety and panic attacks.     03/28/20 Update:  The patient returned for visit and brought the  paperwork that she had forgotten in Feb.  The patient described a a decline in memory and executive functioning along with increasing anxiety and worsening of ADD/attention symptoms.  She describes new onset panic attacks within the past 6 months.  "pins and needles and pain in fingers described".  She describes a decline in her ability to perform tasks that involve concentration.  She continues to use Dexedrine for attention along with valium 1-2 times per day.  The patient had been out on FMLA due to major depresion and new onset of anxiety.  FMLA has run out and the patient has had to stop working but her short-term disability has denied her so far, attributing it to a pre-existing condition according to patient.  The patient describes severe insomnia (not OSA) but has had abnormalities on sleep study related to reduction in REM sleep.    04/11/2020 update: The patient return for a visit today and reports that she has been having some improvements in her sleep pattern and there has been some improvements in her fatigue during the day.  She has reduced how much stimulants she is taking as well as taking less Valium.  The patient reports that she continues to have significant difficulties getting going in the morning but she has been going to bed earlier at night and getting up in the morning sooner and on many days she is able to get up at sunrise.  She has been following sleep hygiene efforts.  05/26/2020: The patient reports that she is continued to have some improvements but continues with significant attentional issues and disturbed sleep.  The patient is continued with the lower dose of psychostimulant medications but this has a negative impact on her attention and concentration but does seem to help with her anxiety symptoms.  The patient has continued to work on sleep hygiene issues and has improved somewhat and will be following up with her neurologist soon.  06/20/2020: The patient reports that she has  continued to make improvements particular around her sleep functioning.  The patient reports that she went to the beach to spend some time as well as visit her friend and was away from her husband another household stressors.  She actively worked on sleep hygiene issues and focused on very fundamental behavioral consistencies.  She reports that she did much better as far as her anxiety and stress.  There continues to be a lot of frustration and stressors associated with her husband's activities and behaviors and the patient is working on more focus on taking care of herself rather than trying to fix all intervene with his issues that he himself is not acknowledging or actively trying to address.  09/08/2020: The patient missed her last appointment as she had documented the time in a confusing way.  It was an 8:00 appointment and she typically has great difficulty getting herself organized to these appointments and miss documented in her calendar and was going to be too late for the appointment before  she realized this discrepancy.  The patient reports that in someway she is done better and has been actively working on a number of things and has made more accomplishments in this period of time then she typically gives herself credit for.  The patient is done very well working on foundational issues and has been improving her behavioral aspects of sleep hygiene, paying close attention all to good nutrition and physical action and activity.  The patient reports that she continues to have difficulty maintaining attention and focus and there have been efforts to adjust some of her medications for optimization.  The patient reports that there continue to be significant stressors with her husband and her husband's psychiatric issues of severe OCD including hoarding type behaviors.  10/06/2020: The patient reports that she has continued to have significant issues of cognitive difficulties and fatigue during the day.   Patient continues to have sleep disturbance and we talked about some very practical issues around sleep hygiene including sleeping in her room without pets that she reports will wake her up at various times at night.  Patient continues to have significant pain continued severe sleep disturbance but has improved sleep hygiene and sleep pattern significantly since initially being here.  11/10/2020: The patient reports that she has been off track with regard to her sleep hygiene regimen and has noticed a worsening of her cognitive difficulties including attentional issues, anxiety stress, executive function etc. when she does not follow her sleep hygiene regimen is closely as she had been.  The patient reports that there have been medical complications recently with both her and her husband developing symptomatic COVID-19 infection with the patient having more than a week of illness.  During the time her husband was sick he did completely stop drinking which she was hopeful would lead to his continued cessation of alcohol use.  However, as soon as he felt well enough to go back to longstanding habits he did return to his alcohol use and consumption but initially restricted it to drinking beer but is now returned to his previous multi sources of alcohol use.  The patient reports that she has gotten better about being stressed over the impacts of his alcohol abuse realizing that there is little to nothing that she can do to manage or control his activities.  The patient is focused on her wellbeing overall and is learned a number of viable coping skills and implemented those to help her symptoms.  However, she continues to have significant attentional deficits and other cognitive deficits.  01/05/2021: The patient appeared to be in some of the best status she has been since I started seeing her.  Her anxiety was down and she had good cognitive and mental status.  The patient's mood was better and she reports that she  has been sleeping better although recently she has not been doing all of the sleep hygiene that she had done to affect improved sleep patterns.  Psychosocial stressors have been a little bit better with her husband returning to work in his office during the day which gives her a break from being around him 24/7.  Her husband continues to consume significant amounts of alcohol which limit their healthy interactions.  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  The patient reports that things have continued to be very similar at home although her husband has gone back to work.  The patient also house that for a friend for a weekend that also went very well.  The patient reports that she has had times where her anxiety seems to exacerbate but overall she continues to do better.  04/26/2021: The patient reports that her uncle is moved into the office structure that they built behind their house that was originally built to house her husband's home office that was not being used.  She reports that initially her husband was resistant to this but decided it was okay.  She reports that overall this is turned out well and it is helped motivate her in some aspects.  The patient continues to struggle with completing task but was able to help get the structure ready for her uncle.  He has not overextended himself into their day-to-day lives and it does appear to be going fairly well.  The patient reports that her pain continues as well as stress of depression but she has been having fewer panic attacks.  09/11/2021: The patient reports that things have been changing around her house significantly lately.  The patient reports that her and her husband have decided to  look at selling their house and they have had to be doing a great deal of things around the house or getting ready to to prepare it for sale.  The plan is to move into a smaller home but her husband's compulsive buying and hoarding of types of clothes etc. are having to be changed and fixed.  There is plan to do some remodeling in the house which are all can fall on her shoulders.  However, she is looking forward to making some significant changes which will help her overall.  We continue to work on therapeutic interventions around her cognitive issues and depression and anxiety.  11/21/2021: The patient reports that there have been some stressors between her and her husband recently that have been more than typical.  He continues to abuse alcohol but after this being specifically called out by their son who is now living with them he has made some attempts at reducing the amount of alcohol he is consuming.  His strategy has been to switch from distilled liquor to beer.  While this is produced some changes and reduced the level of his intoxication it is only primarily been done during the week and on weekends he goes back to consuming larger amounts of distilled alcohol.  The patient is very frustrated by his unwillingness to transfer money into her account so she could pay her insurance bills while at the same time continuing his compulsive buying habits and hoarding behaviors etc.  The patient is, however, actively working on trying to take care of herself first and feels like she is making some personal gains and doing better overall.  She is actively working on foundational issues such as diet, sleep and physical activity.  She continues to have attentional issues and other cognitive issues secondary to significant attention and concentration deficits.  Patient reports an improvement in her depression and anxiety symptoms.  01/11/2022: It has been several months since I saw the patient last and she reports  that some of the stressors around her house have been better with her husband drinking less alcohol and her working on some of the issues related to  her panic events and anxiety.  However, she has had some worsening of some of her attentional difficulties fatigue and motivation.  There have been difficulties with medications particularly stimulant type medications due to national shortage of these medications and some attempts at changing medications.  The patient reports that this is left her with extended days to weeks of time without medications.  There was an attempt to switch to Focalin at 1 point and while she felt it was initially helpful she began having significant changes in her mood state and worsening depression.  As she ran out of this medicine relatively quickly her depressive symptoms improved.  They are still working on trying to adjust and acquire psychostimulant medications.  Today we continue to work on therapeutic interventions around her panic disorders, attentional issues and neurocognitive difficulties.  The patient's attention and concentration and memory along with executive functioning continue to be quite impaired.  Sleep continues to be disturbed.  02/27/2022: Following up with the patient she continues to have a lot of stressors around psychosocial issues.  Her attention and cognitive difficulties and ongoing issues with functional neurological issues remain quite problematic.  The patient has actively been working on therapeutic interventions and reports that overall she is improving but there continues to be a lot of psychosocial stressors with her husband's alcohol use in particular.  We continue to work on therapeutic interventions around issues related to her panic disorder and attentional issues and other neurocognitive difficulties.  02/26/2023: Today was a follow-up visit with the patient.  The patient reports that there have been a lot of changes since I saw her last.  She  reports that they have purchased a new house and are readying the sale of their old house and it moved almost completely out other than items left in the garage.  This was a very challenging and stressful event and she got little help from her husband although he did take care of his individual items for the most part.  The patient reports that there have been some adjustments to living in a house with nearby neighbors but overall she feels like she has made progress.  She has been working on therapeutic interventions we have discussed as far as coping and continues to look for ways of better managing her overall status.  12/23/2023: Today's visit was a follow-up visit with this patient.  The patient reports that there has been improvements in her capacity to manage many things in now that her house has been sold and they have moved into a new house some of the stressors have reduced.  However, the patient's husband is still having difficulty letting go of possessions even though a lot was gone through with the sell of their previous house.  The patient reports that she has been able to not become consumed by concerned about his actions and interactions and his ongoing alcohol use which is quite problematic.  The patient reports that she has been able to focus more on herself.  She continues to have significant attentional difficulties, sleeping difficulties, depression and anxiety type symptoms which make functioning quite difficult.  Treatment Interventions:  Cognitive Behavioral and coping strategies along with continued history.  Participation Level:   Active  Participation Quality:  Appropriate and Redirectable      Behavioral Observation:  Well Groomed, Alert, and Appropriate.   Current Psychosocial Factors: Patient reports that they are still significant struggles with psychosocial stressors.  Patient and her husband have been offering on  a house and have their house up for sale and have done a  significant amount of work since I saw her last.  The patient is still struggling with her husband's alcoholism and how that leads her doing most of the day today work.  Content of Session:   Reviewed current symptoms and worked on coping and adjustment issues.    Effectiveness of Interventions: Patient is very motivated to improve and working hard on some of the challenging behavioral changes we have done around sleep hygiene.    Target Goals:   Improve sleep, work on anxiety and depression along with cognitive deficits.  Goals Last Reviewed:   02/26/2023  Goals Addressed Today:    Continued with sleep hygiene issues and concerns around cognitive deficits and coping.  Impression/Diagnosis:   Azul Coffie is a 58 year old female referred by Merlyn Albert, MD with [facility].  The patient has a history of sleep disturbance with reports of cognitive disturbance and bilateral carpal tunnel syndrome.  The patient has been out of work after taking a medical leave in 2017 but then returning to work in a different job.  The patient has a history of major depressive disorder, adult residual attention deficit disorder and potentially residual cognitive difficulties after being diagnosed and treated for breast cancer in 2009 with both chemotherapy and radiation.  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  The patient continues with significant anxiety, pain and sleep disturbance.  The patient has been actively working on therapeutic interventions today we worked on some very practical strategies for aiding her free recall of information and strategies for completing tasks.  10/06/2020: The patient reports that she has continued to have significant issues of cognitive difficulties and fatigue during the day.  Patient continues to have sleep  disturbance and we talked about some very practical issues around sleep hygiene including sleeping in her room without pets that she reports will wake her up at various times at night.  Patient continues to have significant pain continued severe sleep disturbance but has improved sleep hygiene and sleep pattern significantly since initially being here.  11/10/2020: The patient reports that she has been off track with regard to her sleep hygiene regimen and has noticed a worsening of her cognitive difficulties including attentional issues, anxiety stress, executive function etc. when she does not follow her sleep hygiene regimen is closely as she had been.  The patient reports that there have been medical complications recently with both her and her husband developing symptomatic COVID-19 infection with the patient having more than a week of illness.  During the time her husband was sick he did completely stop drinking which she was hopeful would lead to his continued cessation of alcohol use.  However, as soon as he felt well enough to go back to longstanding habits he did return to his alcohol use and consumption but initially restricted it to drinking beer but is now returned to his previous multi sources of alcohol use.  The patient reports that she has gotten better about being stressed over the impacts of his alcohol abuse realizing that there is little to nothing that she can do to manage or control his activities.  The patient is focused on her wellbeing overall and is learned a number of viable coping skills and implemented those to help her symptoms.  However, she continues to have  significant attentional deficits and other cognitive deficits.  01/05/2021: The patient appeared to be in some of the best status she has been since I started seeing her.  Her anxiety was down and she had good cognitive and mental status.  The patient's mood was better and she reports that she has been sleeping better although  recently she has not been doing all of the sleep hygiene that she had done to affect improved sleep patterns.  Psychosocial stressors have been a little bit better with her husband returning to work in his office during the day which gives her a break from being around him 24/7.  Her husband continues to consume significant amounts of alcohol which limit their healthy interactions.  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  The patient reports that things have continued to be very similar at home although her husband has gone back to work.  The patient also house that for a friend for a weekend that also went very well.  The patient reports that she has had times where her anxiety seems to exacerbate but overall she continues to do better.  04/26/2021: The patient reports that her uncle is moved into the office structure that they built behind their house that was originally built to house her husband's home office that was not being used.  She reports that initially her husband was resistant to this but decided it was okay.  She reports that overall this is turned out well and it is helped motivate her in some aspects.  The patient continues to struggle with completing task but was able to help get the structure ready for her uncle.  He has not overextended himself into their day-to-day lives and it does appear to be going fairly well.  The patient reports that her pain continues as well as stress of depression but she has been having fewer panic attacks.  09/11/2021: The patient reports that things have been changing around her house significantly lately.  The patient reports that her and her husband have decided to look at selling their house and  they have had to be doing a great deal of things around the house or getting ready to to prepare it for sale.  The plan is to move into a smaller home but her husband's compulsive buying and hoarding of types of clothes etc. are having to be changed and fixed.  There is plan to do some remodeling in the house which are all can fall on her shoulders.  However, she is looking forward to making some significant changes which will help her overall.  We continue to work on therapeutic interventions around her cognitive issues and depression and anxiety.  11/21/2021: The patient reports that there have been some stressors between her and her husband recently that have been more than typical.  He continues to abuse alcohol but after this being specifically called out by their son who is now living with them he has made some attempts at reducing the amount of alcohol he is consuming.  His strategy has been to switch from distilled liquor to beer.  While this is produced some changes and reduced the level of his intoxication it is  only primarily been done during the week and on weekends he goes back to consuming larger amounts of distilled alcohol.  The patient is very frustrated by his unwillingness to transfer money into her account so she could pay her insurance bills while at the same time continuing his compulsive buying habits and hoarding behaviors etc.  The patient is, however, actively working on trying to take care of herself first and feels like she is making some personal gains and doing better overall.  She is actively working on foundational issues such as diet, sleep and physical activity.  She continues to have attentional issues and other cognitive issues secondary to significant attention and concentration deficits.  Patient reports an improvement in her depression and anxiety symptoms.  02/27/2022: Following up with the patient she continues to have a lot of stressors around psychosocial issues.  Her  attention and cognitive difficulties and ongoing issues with functional neurological issues remain quite problematic.  The patient has actively been working on therapeutic interventions and reports that overall she is improving but there continues to be a lot of psychosocial stressors with her husband's alcohol use in particular.  We continue to work on therapeutic interventions around issues related to her panic disorder and attentional issues and other neurocognitive difficulties.  02/26/2023: Today was a follow-up visit with the patient.  The patient reports that there have been a lot of changes since I saw her last.  She reports that they have purchased a new house and are readying the sale of their old house and it moved almost completely out other than items left in the garage.  This was a very challenging and stressful event and she got little help from her husband although he did take care of his individual items for the most part.  The patient reports that there have been some adjustments to living in a house with nearby neighbors but overall she feels like she has made progress.  She has been working on therapeutic interventions we have discussed as far as coping and continues to look for ways of better managing her overall status.  12/23/2023: Today's visit was a follow-up visit with this patient.  The patient reports that there has been improvements in her capacity to manage many things in now that her house has been sold and they have moved into a new house some of the stressors have reduced.  However, the patient's husband is still having difficulty letting go of possessions even though a lot was gone through with the sell of their previous house.  The patient reports that she has been able to not become consumed by concerned about his actions and interactions and his ongoing alcohol use which is quite problematic.  The patient reports that she has been able to focus more on herself.  She continues to  have significant attentional difficulties, sleeping difficulties, depression and anxiety type symptoms which make functioning quite difficult.  Diagnosis:   Attention deficit hyperactivity disorder (ADHD), predominantly inattentive type  Panic disorder  Cognitive and neurobehavioral dysfunction  Sleep disturbance    Chapman Commodore, Psy.D. Clinical Psychologist Neuropsychologist

## 2024-02-10 ENCOUNTER — Encounter: Payer: Self-pay | Admitting: Family Medicine

## 2024-02-11 LAB — CYTOLOGY - PAP
Comment: NEGATIVE
Diagnosis: NEGATIVE
High risk HPV: NEGATIVE

## 2024-02-19 NOTE — Progress Notes (Signed)
 GYNECOLOGY PROGRESS NOTE  Subjective:  PCP: Claire Crick, MD  Patient ID: Susan Soto, female    DOB: May 07, 1966, 58 y.o.   MRN: [id number]  HPI  Patient is a 58 y.o. G28P0010 female who presents as a referral from PCP Claire Crick MD for menopausal symptoms with a previous history of breast cancer. Pt previously saw Dr Denman Fischer here for a colposcopy only.   Patient's main symptoms are vaginal, states she has dryness and painful intercourse due to the dryness. This has been present for years and is affecting her marriage. She has used many different types of lubricant and none of them seem to really help long-term. She had heard about putting Testosterone in her vagina and is wondering about that today. Also feels her moods are affected by menopause, several of her friends take HRT and their moods are "normal." H/o L breast cancer s/p wide excision and radiation/chemotherapy 2009. She is not taking any adjunct chemotherapy and states her cancer was triple negative and after 71yrs in remission, oncology stopped seeing her.   Also reports "genital wart" on her R labia that comes and goes, will start out as itching/burning, then the "wart" appears and will respond to valtrex . Also with concern that with self-exams, she feels a "hard" tissue that feels like silly puddy.    GYN HX Last pap: 02/04/24, NILM and HPV neg; LSIL in 2024 Last mammogram: 01/07/2024, BIRADS-1 Periods: post-menopausal, LMP 2015 without PMB STI Hx: denies  OB History  Gravida Para Term Preterm AB Living  3 2   1    SAB IAB Ectopic Multiple Live Births  1        # Outcome Date GA Lbr Len/2nd Weight Sex Type Anes PTL Lv  3 SAB           2 Para           1 Para             Obstetric Comments  1st Menstrual Cycle:  11  1st Pregnancy:  16   Past Medical History:  Diagnosis Date   ADD (attention deficit disorder) 1990s   Anxiety    Bilateral carpal tunnel syndrome 08/10/2019   BRCA negative 05/20/2017    MyRisk neg   Breast cancer (HCC) 2009   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    Genetic testing of female 05/2017   Hemorrhoids    History of breast cancer 2009   Left side, lumpectomy, s/p sentinal LN biopsy, chemo and radiation, remission   Malignant neoplasm of upper-outer quadrant of female breast (HCC) 05/27/2008   Left breast, 1.3 cm triple negative, node negative invasive mammary carcinoma. 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   Fever blister 06/17/2023   Elevated ferritin 06/17/2023   Abnormal Pap smear of cervix 12/20/2022   Palpitations 04/18/2022   Osteoporosis 04/01/2022   Patient on ketogenic diet 12/15/2021   Contact with or exposure to mold 09/23/2019   Bilateral carpal tunnel syndrome 06/13/2019  Sleep disturbance 06/13/2019   Cognitive and neurobehavioral dysfunction 06/13/2019   Thyroid  nodule 09/25/2018   Postmenopausal disorder 07/25/2018   Dyspareunia due to medical condition in female 06/18/2017   Chronic fatigue 07/06/2016   Symptomatic PVCs 06/17/2014   Vitamin D  deficiency 02/22/2014   Mixed stress and urge urinary incontinence 05/26/2013   Postmenopausal 03/04/2013   Healthcare maintenance 11/19/2011   Subclinical hypothyroidism 08/16/2009   Personal history of breast cancer 05/27/2008   HEMORRHOIDS, INTERNAL, WITH BLEEDING 04/19/2008   MDD (major depressive disorder), recurrent episode, moderate (HCC) 01/13/2008   History of disease of skin and subcutaneous tissue 12/31/2007   Attention deficit hyperactivity  disorder (ADHD), predominantly inattentive type 12/26/2007   Past Surgical History:  Procedure Laterality Date   bartholin's cystectomy     BREAST BIOPSY Left 05/07/08   Left (infitr ductal CA)- Dr. Marquita Situ   BREAST BIOPSY Left 06/25/13   left (benign) atrophic ductal epithelium, microcalcifications, changes of previous surgery.   BREAST LUMPECTOMY Left 05/27/2008   invasive ductal carcinoma   breast wide excision  05/27/08   Left-with sentinel node bx (Dr. Marquita Situ)   COLONOSCOPY  07/2009   WNL ([facility])   COLONOSCOPY WITH PROPOFOL  N/A 10/06/2019   WNL, rpt 10 yrs ([facility])   CYSTOSCOPY     episiotomy     Family History  Problem Relation Age of Onset   Drug abuse Father        Heroin   Hypertension Mother    Fibromyalgia Mother    ADD / ADHD Mother    Depression Mother    Cancer Mother        cervical, bladder, non melanoma skin   Colon polyps Mother        Hyperplastic   ADD / ADHD Brother    Anxiety disorder Brother    Stroke Maternal Grandmother    Breast cancer Maternal Grandmother        50's   Hypertension Paternal Grandmother    Cancer Paternal Grandfather        lung   Hypothyroidism Maternal Aunt    Cancer Maternal Aunt        vulvar, cervical, non melanoma skin   Hypothyroidism Maternal Uncle    Cancer Maternal Aunt        cervical, lung, precancerous colon polyps   Diabetes Neg Hx    Coronary artery disease Neg Hx    Social History   Socioeconomic History   Marital status: Married    Spouse name: Not on file   Number of children: Not on file   Years of education: Not on file   Highest education level: Not on file  Occupational History   Occupation: Child psychotherapist  Tobacco Use   Smoking status: Never   Smokeless tobacco: Never  Vaping Use   Vaping status: Never Used  Substance and Sexual Activity   Alcohol use: No   Drug use: No   Sexual activity: Yes    Birth control/protection: None  Other Topics Concern   Not on file  Social History Narrative    Caffeine: 1 cup coffee in am   Lives with husband and 1 son, daughter in college, 1 dog and 1 cat, 5-6 outside cats   Occupation: Writer job   Activity: no regular activity   Diet: joined [employer], good water, fruits/vegetables daily, red meat 3x/wk, fish 2x/wk      Psych- Dr. Ander Katos in [place] (ph [phone])   Social Drivers of Health  Financial Resource Strain: Not on file  Food Insecurity: Not on file  Transportation Needs: Not on file  Physical Activity: Not on file  Stress: Not on file  Social Connections: Not on file  Intimate Partner Violence: Not on file   Current Outpatient Medications on File Prior to Visit  Medication Sig Dispense Refill   Ascorbic Acid (VITAMIN C) POWD Take 1,000 mg by mouth daily.     B Complex Vitamins (VITAMIN B COMPLEX PO) Take by mouth daily.     dextroamphetamine  (ZENZEDI ) 10 MG tablet Take 1 tablet (10 mg total) by mouth daily.     diazepam  (VALIUM ) 5 MG tablet Take 5 mg by mouth at bedtime.     Multiple Vitamins-Minerals (ANTIOXIDANT FORMULA SG) capsule Take 1 capsule by mouth daily. Powder daily     thyroid  (ARMOUR THYROID ) 60 MG tablet Take 1 tablet (60 mg total) by mouth daily before breakfast. One day a week take 1/2 tablet only 30 tablet 11   valACYclovir  (VALTREX ) 1000 MG tablet Take 2 tablets (2,000 mg total) by mouth in the morning and at bedtime. 20 tablet 1   ASHWAGANDHA PO Take 375 mg by mouth at bedtime. (Patient not taking: Reported on 02/20/2024)     Cholecalciferol  100 MCG (4000 UT) CAPS Take 1 capsule (4,000 Units total) by mouth daily. (Patient not taking: Reported on 02/20/2024)     COLLAGEN PO Take 1 tablet by mouth daily. (Patient not taking: Reported on 02/20/2024)     medium chain triglycerides (MCT OIL) oil Take 15 mLs by mouth daily. Takes 3 TBSP daily (Patient not taking: Reported on 02/20/2024)     Omega-3 Fatty Acids (OMEGA 3 PO) Take 690 mg by mouth daily. Nordic naturals (Patient not taking: Reported on  02/20/2024)     OVER THE COUNTER MEDICATION 2 capsules daily. KETO- metabolic ketosis support (Patient not taking: Reported on 02/20/2024)     Probiotic Product (PROBIOTIC DAILY PO) Take by mouth daily. (Patient not taking: Reported on 02/20/2024)     No current facility-administered medications on file prior to visit.   Allergies  Allergen Reactions   Bupropion Hcl     REACTION: Hives   Buspar [Buspirone Hcl] Nausea And Vomiting   Other Other (See Comments)    Dissolving sutures Surgical glue bandaids    Strattera  [Atomoxetine  Hcl] Other (See Comments)    Urinary trouble   Tegaderm Ag Mesh [Silver]     Review of Systems Pertinent items are noted in HPI.   Objective:   Blood pressure 93/66, pulse 78, height 5\' 5"  (1.651 m), weight 132 lb (59.9 kg), last menstrual period 03/04/2014. Body mass index is 21.97 kg/m. Physical Exam Vitals and nursing note reviewed. Exam conducted with a chaperone present.  Constitutional:      Appearance: Normal appearance.  HENT:     Head: Normocephalic and atraumatic.  Eyes:     Extraocular Movements: Extraocular movements intact.  Pulmonary:     Effort: Pulmonary effort is normal.  Genitourinary:    Labia:        Right: Lesion (2mm raised lesion with a central scabbed over vesicle) present.      Urethra: Prolapse present.     Cervix: Normal. No cervical motion tenderness, discharge or friability.     Uterus: Normal.      Adnexa: Right adnexa normal and left adnexa normal.       Comments: Atrophic vulvovaginal tissue Neurological:     General: No focal deficit present.  Mental Status: She is alert.      Assessment/Plan:   1. Atrophic vulvovaginitis   2. Postmenopausal   3. Personal history of breast cancer    58 y.o. G3P0010 with postmenopausal vulvovaginal atrophy and PMH of triple-negative breast cancer in 2009, discharged from oncology in 2019 after no recurrence, and desiring HRT today for VVA and moods. We had a long  discussion about topical vs systemic HRT, and although pt's cancer was triple-negative, do not feel comfortable prescribing systemic today. Because of the severity of her VVA and the impact it is taking on her life, we discussed starting vaginal estrogen only, she is fully aware that this could raise her risk developing an estrogen-related cancer and agrees to immediately stop if warnings signs present. Also agrees to stay UTD on her preventative screenings. We discussed progesterone  protection and pt desires this. We discussed vaginal moisturizers and recommend she trial and use 3-5 times weekly. Info in AVS. 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 On exam, the tissue patient is palpating herself is her cervix, we reviewed anatomy with a model and helped her feel more familiar with what she has experienced.  -Rx for Estrace : first 2 weeks nightly, then 3 times weekly; SE and dosing reviewed, including vaginal burning. Should rub small amount onto the labia and on urethral bulb.  -Rx for Progesterone  100mg  at bedtime, SE and dosing reviewed -RTC in 1 month for follow up, sooner prn.   Total time was 34 minutes. That includes chart review before the visit, the actual patient visit, and time spent on documentation after the visit. Time excludes procedures, if any.    Sofia Dunn, DO [facility]

## 2024-02-20 ENCOUNTER — Encounter: Payer: Self-pay | Admitting: Obstetrics

## 2024-02-20 ENCOUNTER — Ambulatory Visit: Admitting: Obstetrics

## 2024-02-20 VITALS — BP 93/66 | HR 78 | Ht 65.0 in | Wt 132.0 lb

## 2024-02-20 DIAGNOSIS — Z853 Personal history of malignant neoplasm of breast: Secondary | ICD-10-CM

## 2024-02-20 DIAGNOSIS — N368 Other specified disorders of urethra: Secondary | ICD-10-CM | POA: Insufficient documentation

## 2024-02-20 DIAGNOSIS — N952 Postmenopausal atrophic vaginitis: Secondary | ICD-10-CM | POA: Diagnosis not present

## 2024-02-20 DIAGNOSIS — Z780 Asymptomatic menopausal state: Secondary | ICD-10-CM | POA: Diagnosis not present

## 2024-02-20 MED ORDER — PROGESTERONE MICRONIZED 100 MG PO CAPS: 100.0000 mg | ORAL_CAPSULE | Freq: Every day | ORAL | 3 refills | Status: DC

## 2024-02-20 MED ORDER — ESTRADIOL 0.1 MG/GM VA CREA
1.0000 | TOPICAL_CREAM | VAGINAL | 12 refills | Status: AC
Start: 2024-02-20 — End: ?

## 2024-02-20 NOTE — Patient Instructions (Signed)
 There are several treatment options for vaginal dryness. Some, such as vaginal moisturizers or lubricants, are available without a prescription.  Vaginal lubricants and moisturizers -- You can buy these without a prescription in most pharmacies. Vaginal lubricants and moisturizers do not contain any hormones and have virtually no systemic (body-wide) side effects. Possible local side effects include irritation or a burning feeling after application.  ?Lubricants are designed to reduce friction and discomfort from dryness during sexual intercourse. The lubricant is applied inside the vagina and/or on the partner's penis or fingers just before sex. Products sold specifically as vaginal lubricants are more effective than lubricants that are not designed for this purpose, such as petroleum jelly. In addition, oil-based lubricants (like petroleum jelly, baby oil, or mineral oil) can damage latex condoms and/or diaphragms and make them less effective in preventing pregnancy or sexually transmitted infections. Lubricants that are made with water or silicones can be used with latex condoms and diaphragms. Polyurethane condoms can be used with oil-based products. Natural lubricants, such as olive, coconut, avocado, or peanut oil, are easily available products that may be used as a lubricant with sex. However, it is important to know that, like the oil-based lubricants, natural oils are not recommended for use with latex condoms or diaphragms, as they can damage the latex. Water- or silicone-based lubricants are a better choice if you use condoms or a diaphragm.  ?Vaginal moisturizers usually include hyaluronic acid and are formulated to allow the vaginal tissues to retain moisture more effectively. Moisturizers are applied into the vagina (as a gel or suppository) approximately three times weekly to allow a continuous moisturizing effect. Be sure to check the label to ensure that you are purchasing  a moisturizer and not a lubricant. These can be found at your local drug stores and online.  Both lubricants and moisturizers are also available in preservative-free forms.  Hand and body lotions and moisturizers should not be used to relieve vaginal dryness since they can be irritating to the vaginal tissues

## 2024-03-31 ENCOUNTER — Telehealth: Payer: Self-pay

## 2024-03-31 DIAGNOSIS — Z0279 Encounter for issue of other medical certificate: Secondary | ICD-10-CM

## 2024-03-31 NOTE — Telephone Encounter (Signed)
 Patient dropped off paperwork that needs to be filled out by Dr. Mariam Shingles.  Patient paid FMLA fee

## 2024-03-31 NOTE — Telephone Encounter (Signed)
 Placed ppw on Susan Soto's desk.

## 2024-04-01 NOTE — Telephone Encounter (Signed)
 Received filled out paperwork for provider to review. I will reach out to patient when forms are complete, to pick up and take to employer. Forms placed in providers box for review.

## 2024-04-06 ENCOUNTER — Telehealth: Payer: Self-pay | Admitting: Family Medicine

## 2024-04-06 NOTE — Telephone Encounter (Signed)
 See 03/31/24 phn note.

## 2024-04-06 NOTE — Telephone Encounter (Signed)
 Filled and in Lisa's box.

## 2024-04-06 NOTE — Telephone Encounter (Signed)
 Copied from CRM [id number]. Topic: General - Other >> Apr 06, 2024  1:07 PM [employer] wrote: Reason for CRM: Patient dropped off paperwork on monday 03/30/24 and has not heard back. Patient would like a callback with status update because paperwork is due this week and she will be going out of town on Thursday and needs paperwork before then. She does not mind paperwork being faxed from office if that is an option.

## 2024-04-07 NOTE — Telephone Encounter (Signed)
 Placed FMLA ppw on Erin's desk.

## 2024-04-07 NOTE — Telephone Encounter (Signed)
 Received completed forms from provider. Copy sent to scan. Original left at the front desk for patient to pick up and forward to employer. Pt notified via phone that forms were ready to pick up at her convenience.

## 2024-05-07 ENCOUNTER — Telehealth: Payer: Self-pay | Admitting: Family Medicine

## 2024-05-07 NOTE — Telephone Encounter (Signed)
 Faxed over office notes to [employer] at request of provider  Documents in media tab, please see [phone] for office note request  Transaction successful to [phone] at 11:45am

## 2024-05-07 NOTE — Telephone Encounter (Signed)
 Noted

## 2024-05-14 ENCOUNTER — Telehealth: Payer: Self-pay | Admitting: Family Medicine

## 2024-05-14 NOTE — Telephone Encounter (Signed)
 Copied from CRM #[id number]. Topic: General - Other >> May 14, 2024  4:46 PM [name] wrote: Reason for CRM: Patient is calling in stating that [employer] needs more information, they are requesting the most recent office note for the patient. They are requesting it to be faxed to them with the letter she received. Patient stated she could pick up the office note tomorrow 07/25 and fax them the notes and letter she received back. Please advise patient if she can pick up these in the office.

## 2024-05-15 ENCOUNTER — Telehealth: Payer: Self-pay | Admitting: Family Medicine

## 2024-05-15 NOTE — Telephone Encounter (Signed)
 Patient would like to know if she needs to have lab work prior to her F/U in September.

## 2024-05-15 NOTE — Telephone Encounter (Signed)
 Patient brought forms in that need to be faxed with office note to [employer] placed in providers box at front desk.

## 2024-05-15 NOTE — Telephone Encounter (Signed)
 Faxed as requested

## 2024-05-15 NOTE — Telephone Encounter (Signed)
 Spoke to patient she needs last office visit notes faxed with cover sheet that was mailed to her. I have printed off last 2 office notes as well as form that was completed in our office. She will bring by cover sheet today and we will fax. Notes in lisa's box holding until form received from patient.

## 2024-05-22 ENCOUNTER — Other Ambulatory Visit: Payer: Self-pay

## 2024-05-22 MED ORDER — VALACYCLOVIR HCL 1 G PO TABS: 2000.0000 mg | ORAL_TABLET | Freq: Two times a day (BID) | ORAL | 1 refills | Status: DC

## 2024-05-22 NOTE — Telephone Encounter (Signed)
 Yes plz schedule lab visit 1 wk prior to OV.

## 2024-06-22 ENCOUNTER — Other Ambulatory Visit: Payer: Self-pay | Admitting: Family Medicine

## 2024-06-22 DIAGNOSIS — R7989 Other specified abnormal findings of blood chemistry: Secondary | ICD-10-CM

## 2024-06-22 DIAGNOSIS — Z148 Genetic carrier of other disease: Secondary | ICD-10-CM

## 2024-06-22 DIAGNOSIS — R5382 Chronic fatigue, unspecified: Secondary | ICD-10-CM

## 2024-06-22 DIAGNOSIS — E559 Vitamin D deficiency, unspecified: Secondary | ICD-10-CM

## 2024-06-22 DIAGNOSIS — E038 Other specified hypothyroidism: Secondary | ICD-10-CM

## 2024-06-22 DIAGNOSIS — E618 Deficiency of other specified nutrient elements: Secondary | ICD-10-CM

## 2024-06-23 ENCOUNTER — Other Ambulatory Visit

## 2024-07-01 ENCOUNTER — Ambulatory Visit: Admitting: Family Medicine

## 2024-07-14 ENCOUNTER — Other Ambulatory Visit (INDEPENDENT_AMBULATORY_CARE_PROVIDER_SITE_OTHER)

## 2024-07-14 DIAGNOSIS — E559 Vitamin D deficiency, unspecified: Secondary | ICD-10-CM

## 2024-07-14 DIAGNOSIS — R7989 Other specified abnormal findings of blood chemistry: Secondary | ICD-10-CM

## 2024-07-14 DIAGNOSIS — Z148 Genetic carrier of other disease: Secondary | ICD-10-CM

## 2024-07-14 DIAGNOSIS — R5382 Chronic fatigue, unspecified: Secondary | ICD-10-CM

## 2024-07-14 DIAGNOSIS — E038 Other specified hypothyroidism: Secondary | ICD-10-CM

## 2024-07-14 DIAGNOSIS — E618 Deficiency of other specified nutrient elements: Secondary | ICD-10-CM

## 2024-07-14 NOTE — Addendum Note (Signed)
 Addended by: HOPE VEVA PARAS on: 07/14/2024 11:18 AM   Modules accepted: Orders

## 2024-07-15 LAB — IRON AND TIBC
Iron Saturation: 28 % (ref 15–55)
Iron: 88 ug/dL (ref 27–159)
Total Iron Binding Capacity: 314 ug/dL (ref 250–450)
UIBC: 226 ug/dL (ref 131–425)

## 2024-07-15 LAB — CBC WITH DIFFERENTIAL/PLATELET
Basophils Absolute: 0 x10E3/uL (ref 0.0–0.2)
Basos: 1 %
EOS (ABSOLUTE): 0.1 x10E3/uL (ref 0.0–0.4)
Eos: 2 %
Hematocrit: 41.6 % (ref 34.0–46.6)
Hemoglobin: 13.5 g/dL (ref 11.1–15.9)
Immature Grans (Abs): 0 x10E3/uL (ref 0.0–0.1)
Immature Granulocytes: 0 %
Lymphocytes Absolute: 3.1 x10E3/uL (ref 0.7–3.1)
Lymphs: 49 %
MCH: 31 pg (ref 26.6–33.0)
MCHC: 32.5 g/dL (ref 31.5–35.7)
MCV: 96 fL (ref 79–97)
Monocytes Absolute: 0.6 x10E3/uL (ref 0.1–0.9)
Monocytes: 10 %
Neutrophils Absolute: 2.4 x10E3/uL (ref 1.4–7.0)
Neutrophils: 38 %
Platelets: 271 x10E3/uL (ref 150–450)
RBC: 4.35 x10E6/uL (ref 3.77–5.28)
RDW: 12.8 % (ref 11.7–15.4)
WBC: 6.3 x10E3/uL (ref 3.4–10.8)

## 2024-07-15 LAB — COMPREHENSIVE METABOLIC PANEL WITH GFR
ALT: 11 IU/L (ref 0–32)
AST: 11 IU/L (ref 0–40)
Albumin: 4.5 g/dL (ref 3.8–4.9)
Alkaline Phosphatase: 50 IU/L (ref 49–135)
BUN/Creatinine Ratio: 22 (ref 9–23)
BUN: 13 mg/dL (ref 6–24)
Bilirubin Total: 0.4 mg/dL (ref 0.0–1.2)
CO2: 24 mmol/L (ref 20–29)
Calcium: 9.6 mg/dL (ref 8.7–10.2)
Chloride: 102 mmol/L (ref 96–106)
Creatinine, Ser: 0.6 mg/dL (ref 0.57–1.00)
Globulin, Total: 2.3 g/dL (ref 1.5–4.5)
Glucose: 88 mg/dL (ref 70–99)
Potassium: 4.6 mmol/L (ref 3.5–5.2)
Sodium: 140 mmol/L (ref 134–144)
Total Protein: 6.8 g/dL (ref 6.0–8.5)
eGFR: 104 mL/min/1.73 (ref >59)

## 2024-07-15 LAB — T3: T3, Total: 82 ng/dL (ref 71–180)

## 2024-07-15 LAB — FERRITIN: Ferritin: 187 ng/mL — ABNORMAL HIGH (ref 15–150)

## 2024-07-15 LAB — T4, FREE: Free T4: 0.75 ng/dL — ABNORMAL LOW (ref 0.82–1.77)

## 2024-07-15 LAB — VITAMIN D 25 HYDROXY (VIT D DEFICIENCY, FRACTURES): Vit D, 25-Hydroxy: 45.1 ng/mL (ref 30.0–100.0)

## 2024-07-15 LAB — TSH: TSH: 2.45 u[IU]/mL (ref 0.450–4.500)

## 2024-07-15 LAB — VITAMIN B12: Vitamin B-12: 532 pg/mL (ref 232–1245)

## 2024-07-16 LAB — IODINE, RANDOM URINE: Iodine, Urine: 192.4 ug/L (ref 28.0–544.0)

## 2024-07-20 ENCOUNTER — Ambulatory Visit: Payer: Self-pay | Admitting: Family Medicine

## 2024-07-20 NOTE — Progress Notes (Unsigned)
    GYNECOLOGY PROGRESS NOTE  Subjective:  PCP: Rilla Baller, MD  Patient ID: Susan Soto, female    DOB: 1965/11/12, 58 y.o.   MRN: [id number]  HPI  Patient is a 58 y.o. G59P0010 female who presents for follow-up on HRT started 02/20/24.  Patient was started on Estrace  cream 3 times a week and Progesterone  100mg  nightly for atrophic vulvovaginitis.  {Common ambulatory SmartLinks:19316}  Review of Systems {ros; complete:30496}   Objective:   Last menstrual period 03/04/2014. There is no height or weight on file to calculate BMI.  General appearance: {general exam:16600} Abdomen: {abdominal exam:16834} Pelvic: {pelvic exam:16852::cervix normal in appearance,external genitalia normal,no adnexal masses or tenderness,no cervical motion tenderness,rectovaginal septum normal,uterus normal size, shape, and consistency,vagina normal without discharge} Extremities: {extremity exam:5109} Neurologic: {neuro exam:17854}   Assessment/Plan:   No diagnosis found.   There are no diagnoses linked to this encounter.     Estil Mangle, DO [facility]

## 2024-07-21 ENCOUNTER — Encounter: Payer: Self-pay | Admitting: Obstetrics

## 2024-07-21 ENCOUNTER — Ambulatory Visit (INDEPENDENT_AMBULATORY_CARE_PROVIDER_SITE_OTHER): Admitting: Obstetrics

## 2024-07-21 ENCOUNTER — Ambulatory Visit (INDEPENDENT_AMBULATORY_CARE_PROVIDER_SITE_OTHER): Admitting: Family Medicine

## 2024-07-21 ENCOUNTER — Encounter: Payer: Self-pay | Admitting: Family Medicine

## 2024-07-21 VITALS — BP 132/87 | HR 86 | Ht 65.0 in | Wt 141.0 lb

## 2024-07-21 VITALS — BP 124/78 | HR 75 | Temp 98.4°F | Ht 65.0 in | Wt 140.4 lb

## 2024-07-21 DIAGNOSIS — R5382 Chronic fatigue, unspecified: Secondary | ICD-10-CM | POA: Diagnosis not present

## 2024-07-21 DIAGNOSIS — N952 Postmenopausal atrophic vaginitis: Secondary | ICD-10-CM | POA: Diagnosis not present

## 2024-07-21 DIAGNOSIS — Z780 Asymptomatic menopausal state: Secondary | ICD-10-CM | POA: Diagnosis not present

## 2024-07-21 DIAGNOSIS — E618 Deficiency of other specified nutrient elements: Secondary | ICD-10-CM

## 2024-07-21 DIAGNOSIS — E041 Nontoxic single thyroid nodule: Secondary | ICD-10-CM | POA: Diagnosis not present

## 2024-07-21 DIAGNOSIS — N368 Other specified disorders of urethra: Secondary | ICD-10-CM | POA: Diagnosis not present

## 2024-07-21 DIAGNOSIS — M810 Age-related osteoporosis without current pathological fracture: Secondary | ICD-10-CM

## 2024-07-21 DIAGNOSIS — E038 Other specified hypothyroidism: Secondary | ICD-10-CM | POA: Diagnosis not present

## 2024-07-21 DIAGNOSIS — R7989 Other specified abnormal findings of blood chemistry: Secondary | ICD-10-CM

## 2024-07-21 DIAGNOSIS — Z853 Personal history of malignant neoplasm of breast: Secondary | ICD-10-CM | POA: Diagnosis not present

## 2024-07-21 MED ORDER — VALACYCLOVIR HCL 1 G PO TABS: 2000.0000 mg | ORAL_TABLET | Freq: Two times a day (BID) | ORAL | 3 refills | Status: AC

## 2024-07-21 MED ORDER — NORETHINDRONE ACETATE 5 MG PO TABS: 5.0000 mg | ORAL_TABLET | Freq: Every day | ORAL | 3 refills | Status: DC

## 2024-07-21 NOTE — Patient Instructions (Signed)
 Vaginal Dryness and Thinning (Atrophic Vaginitis): What to Know  Atrophic vaginitis is when the lining of the vagina becomes dry, thin, and inflamed. Tell your doctor if you notice any changes in your vagina. They can help you find ways to feel better. They can also treat infections and suggest healthy habits for a healthy vagina. What are the causes? This condition is caused by a drop in estrogen. This affects the moisture in the lining of your vagina. When estrogen levels are low, the tissues become dry. This is most common when menstrual periods stop during menopause. What increases the risk? You're more likely to get this condition if: You take medicines that block estrogen. You've had your ovaries taken out. You're being treated for cancer. You recently had a baby. You're breastfeeding. You're over 27 years old. You have an eating disorder. You smoke. What are the signs or symptoms? Pain during sex. Soreness during sex. Bleeding during sex. Burning or itching around your vagina. Loss of interest in sex. Burning pain when you pee. Peeing often. Fluid coming from your vagina that isn't normal. It may be: White. [place]. Yellow. Tinted with blood. Thick. Watery. Some people don't have symptoms. How is this treated? Using a lubricant before sex. Using a moisturizer in the vagina. Using estrogen in the vagina. You may not need treatment if your symptoms are mild or you're not having sex. Follow these instructions at home: Medicines Take your medicines only as told. Do not use herbal or other medicines unless your doctor says it's safe. Use creams, lubricants, or moisturizers only as told. General instructions Talk with your doctor about any menopause symptoms and treatment options. Do not douche. Do not use scented: Sprays. Tampons. Soaps. If sex hurts, try using lubricants right before you have sex. Contact a doctor if: You have fluid coming from the vagina that's not  normal. You have a smell coming from your vagina. You have new symptoms. Your symptoms don't get better with treatment. Your symptoms get worse. This information is not intended to replace advice given to you by your health care provider. Make sure you discuss any questions you have with your health care provider. Document Revised: 05/20/2023 Document Reviewed: 05/20/2023 Elsevier Patient Education  2024 [employer].

## 2024-07-21 NOTE — Patient Instructions (Addendum)
 Call [facility] to schedule bone density scan, let me know if any trouble getting this set up.  Good to see you today Try different brand progesterone , if sedation doesn't improve, let us  know and we could increase armour thyroid  back to 1 whole tablet daily  Consider prevnar-20.  Return as needed or in 6 months for physical

## 2024-07-21 NOTE — Progress Notes (Unsigned)
 Ph: [phone] Fax: [phone]   Patient ID: Susan Soto, female    DOB: 07/16/1966, 58 y.o.   MRN: [id number]  This visit was conducted in person.  BP 124/78   Pulse 75   Temp 98.4 F (36.9 C) (Oral)   Ht 5' 5 (1.651 m)   Wt 140 lb 6 oz (63.7 kg)   LMP 03/04/2014   SpO2 98%   BMI 23.36 kg/m    CC: 6 mo f/u visit  Subjective:   HPI: Susan Soto is a 58 y.o. female presenting on 07/21/2024 for Medical Management of Chronic Issues (Here for 6 mo f/u.)   Saw GYN earlier today on vaginal estrace  02/2024 + systemic progestin.   She's started seeing Hunter Corrente NP in [place] at [facility] - labs found anti-TPO was high to 135 - thyroglobulin Ab also elevated to 6 (normal <1) consistent with Hashimoto's disease.   She continues armour thyroid  60mg  daily with 1/2 tablet one day a week.   OP - planning to start OsteoFit program through static resistance/red light treatment.      Relevant past medical, surgical, family and social history reviewed and updated as indicated. Interim medical history since our last visit reviewed. Allergies and medications reviewed and updated. Outpatient Medications Prior to Visit  Medication Sig Dispense Refill   Ascorbic Acid (VITAMIN C) POWD Take 1,000 mg by mouth daily.     ASHWAGANDHA PO Take 375 mg by mouth at bedtime.     B Complex Vitamins (VITAMIN B COMPLEX PO) Take by mouth daily.     Cholecalciferol  100 MCG (4000 UT) CAPS Take 1 capsule (4,000 Units total) by mouth daily.     COLLAGEN PO Take 1 tablet by mouth daily.     dextroamphetamine  (ZENZEDI ) 10 MG tablet Take 1 tablet (10 mg total) by mouth daily.     diazepam  (VALIUM ) 5 MG tablet Take 5 mg by mouth at bedtime.     estradiol  (ESTRACE  VAGINAL) 0.1 MG/GM vaginal cream Place 1 Applicatorful vaginally as directed. First two weeks, place 1 applicator-full nightly at bedtime. Then decrease to three times weekly 42.5 g 12   medium chain triglycerides (MCT OIL)  oil Take 15 mLs by mouth daily. Takes 3 TBSP daily     Multiple Vitamins-Minerals (ANTIOXIDANT FORMULA SG) capsule Take 1 capsule by mouth daily. Powder daily     norethindrone (AYGESTIN) 5 MG tablet Take 1 tablet (5 mg total) by mouth daily. 90 tablet 3   Omega-3 Fatty Acids (OMEGA 3 PO) Take 690 mg by mouth daily. Nordic naturals     OVER THE COUNTER MEDICATION 2 capsules daily. KETO- metabolic ketosis support     Probiotic Product (PROBIOTIC DAILY PO) Take by mouth daily.     thyroid  (ARMOUR THYROID ) 60 MG tablet Take 1 tablet (60 mg total) by mouth daily before breakfast. One day a week take 1/2 tablet only 30 tablet 11   valACYclovir  (VALTREX ) 1000 MG tablet Take 2 tablets (2,000 mg total) by mouth in the morning and at bedtime. 20 tablet 1   No facility-administered medications prior to visit.     Per HPI unless specifically indicated in ROS section below Review of Systems  Objective:  BP 124/78   Pulse 75   Temp 98.4 F (36.9 C) (Oral)   Ht 5' 5 (1.651 m)   Wt 140 lb 6 oz (63.7 kg)   LMP 03/04/2014   SpO2 98%   BMI 23.36 kg/m  Wt Readings from Last 3 Encounters:  07/21/24 140 lb 6 oz (63.7 kg)  07/21/24 141 lb (64 kg)  02/20/24 132 lb (59.9 kg)      Physical Exam Vitals and nursing note reviewed.  Constitutional:      Appearance: Normal appearance. She is not ill-appearing.  HENT:     Head: Normocephalic and atraumatic.     Mouth/Throat:     Mouth: Mucous membranes are moist.     Pharynx: Oropharynx is clear. No oropharyngeal exudate or posterior oropharyngeal erythema.  Eyes:     Extraocular Movements: Extraocular movements intact.     Pupils: Pupils are equal, round, and reactive to light.  Cardiovascular:     Rate and Rhythm: Normal rate and regular rhythm.     Pulses: Normal pulses.     Heart sounds: Normal heart sounds. No murmur heard. Pulmonary:     Effort: Pulmonary effort is normal. No respiratory distress.     Breath sounds: Normal breath sounds.  No wheezing, rhonchi or rales.  Musculoskeletal:     Right lower leg: No edema.     Left lower leg: No edema.  Skin:    General: Skin is warm and dry.     Findings: No rash.  Neurological:     Mental Status: She is alert.  Psychiatric:        Mood and Affect: Mood normal.        Behavior: Behavior normal.       Results for orders placed or performed in visit on 07/14/24  TSH   Collection Time: 07/14/24 11:18 AM  Result Value Ref Range   TSH 2.450 0.450 - 4.500 uIU/mL  VITAMIN D  25 Hydroxy (Vit-D Deficiency, Fractures)   Collection Time: 07/14/24 11:18 AM  Result Value Ref Range   Vit D, 25-Hydroxy 45.1 30.0 - 100.0 ng/mL  Vitamin B12   Collection Time: 07/14/24 11:18 AM  Result Value Ref Range   Vitamin B-12 532 232 - 1,245 pg/mL  T4, free   Collection Time: 07/14/24 11:18 AM  Result Value Ref Range   Free T4 0.75 (L) 0.82 - 1.77 ng/dL  CBC with Differential/Platelet   Collection Time: 07/14/24 11:18 AM  Result Value Ref Range   WBC 6.3 3.4 - 10.8 x10E3/uL   RBC 4.35 3.77 - 5.28 x10E6/uL   Hemoglobin 13.5 11.1 - 15.9 g/dL   Hematocrit 58.3 65.9 - 46.6 %   MCV 96 79 - 97 fL   MCH 31.0 26.6 - 33.0 pg   MCHC 32.5 31.5 - 35.7 g/dL   RDW 87.1 88.2 - 84.5 %   Platelets 271 150 - 450 x10E3/uL   Neutrophils 38 Not Estab. %   Lymphs 49 Not Estab. %   Monocytes 10 Not Estab. %   Eos 2 Not Estab. %   Basos 1 Not Estab. %   Neutrophils Absolute 2.4 1.4 - 7.0 x10E3/uL   Lymphocytes Absolute 3.1 0.7 - 3.1 x10E3/uL   Monocytes Absolute 0.6 0.1 - 0.9 x10E3/uL   EOS (ABSOLUTE) 0.1 0.0 - 0.4 x10E3/uL   Basophils Absolute 0.0 0.0 - 0.2 x10E3/uL   Immature Granulocytes 0 Not Estab. %   Immature Grans (Abs) 0.0 0.0 - 0.1 x10E3/uL  Comprehensive metabolic panel with GFR   Collection Time: 07/14/24 11:18 AM  Result Value Ref Range   Glucose 88 70 - 99 mg/dL   BUN 13 6 - 24 mg/dL   Creatinine, Ser 9.39 0.57 - 1.00 mg/dL   eGFR 895 >40  mL/min/1.73   BUN/Creatinine Ratio 22 9 -  23   Sodium 140 134 - 144 mmol/L   Potassium 4.6 3.5 - 5.2 mmol/L   Chloride 102 96 - 106 mmol/L   CO2 24 20 - 29 mmol/L   Calcium 9.6 8.7 - 10.2 mg/dL   Total Protein 6.8 6.0 - 8.5 g/dL   Albumin 4.5 3.8 - 4.9 g/dL   Globulin, Total 2.3 1.5 - 4.5 g/dL   Bilirubin Total 0.4 0.0 - 1.2 mg/dL   Alkaline Phosphatase 50 49 - 135 IU/L   AST 11 0 - 40 IU/L   ALT 11 0 - 32 IU/L  Ferritin   Collection Time: 07/14/24 11:18 AM  Result Value Ref Range   Ferritin 187 (H) 15 - 150 ng/mL  T3   Collection Time: 07/14/24 11:18 AM  Result Value Ref Range   T3, Total 82 71 - 180 ng/dL  Iodine, Random Urine   Collection Time: 07/14/24 11:18 AM  Result Value Ref Range   Iodine, Urine 192.4 28.0 - 544.0 ug/L  Iron and TIBC   Collection Time: 07/14/24 11:18 AM  Result Value Ref Range   Total Iron Binding Capacity 314 250 - 450 ug/dL   UIBC 773 868 - 574 ug/dL   Iron 88 27 - 840 ug/dL   Iron Saturation 28 15 - 55 %    Assessment & Plan:   Problem List Items Addressed This Visit     Subclinical hypothyroidism   Recent testing by [facility] provider showing very high anti-TPO and mildly elevated anti-thyroglobulin levels - ?hashimoto thyroiditis, precipitated by iodine supplementation.  Recent TFTs showing normal TSH, low fT4 - will continue current armour thyroid  regimen of 60mg  daily, with 1/2 tab one day a week, but low threshold to increase back to 60mg  daily.  Offered endo referral. She previously saw 2 providers at [facility] - prefers to not return there. Would consider endocrinologist in [place], but we will continue current regimen as overall TFTs stable.       Chronic fatigue   Overall improvement since starting hormone replacement therapy - vaginal estrogen + oral progesterone  through GYN.       Thyroid  nodule   Will offer 1 more thyroid  US  in 12/2024 to document 5 yrs of stability of isthmic nodule.       Osteoporosis - Primary   Due for rpt DEXA - ordered to [facility] and #  provided to schedule.  She continues vitamin D  4000u daily, encouraged good dietary calcium intake.  She states she's planning on starting Osteo-Fit program through [facility] in [place] - mixing strength training, vibration therapy, red light therapy, and a balance/fall prevention program.       Relevant Orders   DG Bone Density   Elevated ferritin   Chronic, presumed inflammation related but she is also hemochromatosis carrier.  Ferritin levels are trending down - continue to monitor.       Iodine deficiency   Levels normal - will avoid iodine supplementation in possible autoimmune thyroid  disease.       Atrophic vulvovaginitis   On topical estrace  + oral progesterone  through GYN with significant benefit.         Meds ordered this encounter  Medications   valACYclovir  (VALTREX ) 1000 MG tablet    Sig: Take 2 tablets (2,000 mg total) by mouth in the morning and at bedtime.    Dispense:  20 tablet    Refill:  3    Orders  Placed This Encounter  Procedures   DG Bone Density    Standing Status:   Future    Expiration Date:   12/30/2024    Reason for Exam (SYMPTOM  OR DIAGNOSIS REQUIRED):   f/u osteoporosis    Is patient pregnant?:   No    Preferred imaging location?:   [facility]    Patient Instructions  Call [facility] to schedule bone density scan, let me know if any trouble getting this set up.  Good to see you today Try different brand progesterone , if sedation doesn't improve, let us  know and we could increase armour thyroid  back to 1 whole tablet daily  Consider prevnar-20.  Return as needed or in 6 months for physical   Follow up plan: Return in about 6 months (around 01/18/2025) for annual exam, prior fasting for blood work.  Anton Blas, MD

## 2024-07-23 ENCOUNTER — Encounter: Payer: Self-pay | Admitting: Family Medicine

## 2024-07-23 NOTE — Assessment & Plan Note (Signed)
 Levels normal - will avoid iodine supplementation in possible autoimmune thyroid  disease.

## 2024-07-23 NOTE — Assessment & Plan Note (Signed)
 Overall improvement since starting hormone replacement therapy - vaginal estrogen + oral progesterone  through GYN.

## 2024-07-23 NOTE — Assessment & Plan Note (Signed)
 Recent testing by [facility] provider showing very high anti-TPO and mildly elevated anti-thyroglobulin levels - ?hashimoto thyroiditis, precipitated by iodine supplementation.  Recent TFTs showing normal TSH, low fT4 - will continue current armour thyroid  regimen of 60mg  daily, with 1/2 tab one day a week, but low threshold to increase back to 60mg  daily.  Offered endo referral. She previously saw 2 providers at [facility] - prefers to not return there. Would consider endocrinologist in [place], but we will continue current regimen as overall TFTs stable.

## 2024-07-23 NOTE — Assessment & Plan Note (Addendum)
 Due for rpt DEXA - ordered to [facility] and # provided to schedule.  She continues vitamin D  4000u daily, encouraged good dietary calcium intake.  She states she's planning on starting Osteo-Fit program through [facility] in [place] - mixing strength training, vibration therapy, red light therapy, and a balance/fall prevention program.

## 2024-07-23 NOTE — Assessment & Plan Note (Addendum)
 Will offer 1 more thyroid  US  in 12/2024 to document 5 yrs of stability of isthmic nodule.

## 2024-07-23 NOTE — Assessment & Plan Note (Signed)
 Chronic, presumed inflammation related but she is also hemochromatosis carrier.  Ferritin levels are trending down - continue to monitor.

## 2024-07-23 NOTE — Assessment & Plan Note (Signed)
 On topical estrace  + oral progesterone  through GYN with significant benefit.

## 2024-08-05 ENCOUNTER — Encounter: Payer: Self-pay | Admitting: Psychology

## 2024-08-05 ENCOUNTER — Encounter: Payer: Managed Care, Other (non HMO) | Attending: Psychology | Admitting: Psychology

## 2024-08-05 DIAGNOSIS — F411 Generalized anxiety disorder: Secondary | ICD-10-CM | POA: Diagnosis present

## 2024-08-05 DIAGNOSIS — R5382 Chronic fatigue, unspecified: Secondary | ICD-10-CM | POA: Diagnosis present

## 2024-08-05 DIAGNOSIS — F410 Panic disorder [episodic paroxysmal anxiety] without agoraphobia: Secondary | ICD-10-CM | POA: Insufficient documentation

## 2024-08-05 DIAGNOSIS — F09 Unspecified mental disorder due to known physiological condition: Secondary | ICD-10-CM | POA: Insufficient documentation

## 2024-08-05 DIAGNOSIS — G479 Sleep disorder, unspecified: Secondary | ICD-10-CM | POA: Diagnosis present

## 2024-08-05 DIAGNOSIS — F900 Attention-deficit hyperactivity disorder, predominantly inattentive type: Secondary | ICD-10-CM | POA: Diagnosis not present

## 2024-08-05 NOTE — Progress Notes (Signed)
 Neuropsychology Visit  Patient:  Susan Soto   DOB: [birth year]  MR Number: [id number]  Location: [facility] [address] [place], [address] [place] [address] Dept: [phone]  Date of Service: 12/23/2023  Start: 3 PM End: 4 PM  Duration of Service: 1 Hour  Today's visit was an in person visit that was conducted in my outpatient clinic office with the patient myself present.  Provider/Observer:     Norleen JONELLE Asa PsyD  Chief Complaint:      Chief Complaint  Patient presents with   Anxiety   Depression   ADHD   Sleeping Problem   Panic Attack    Reason For Service:     Susan Soto is a 58 year old female referred by Ole Gores, MD with [facility].  The patient has a history of sleep disturbance with reports of cognitive disturbance and bilateral carpal tunnel syndrome.  The patient has been out of work after taking a medical leave in 2017 but then returning to work in a different job.  The patient has a history of major depressive disorder, adult residual attention deficit disorder and potentially residual cognitive difficulties after being diagnosed and treated for breast cancer in 2009 with both chemotherapy and radiation.  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   Patient reports that she was diagnosed with inattentive type attention deficit disorder in her early 92s and always had significant weakness keeping up with paperwork and documentation with her job.  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  The patient reports that she also had significant issues with major depressive disorder.  She reports that she took a medical leave of absence in 2017 after developing significant exacerbation of her major depression  with her mother passing away.  The patient returned to work after couple of months with an opportunity for a different job that had less demands and lower caseload.  Patient reports that she continued to have difficulty keeping up with any kind of paperwork and her previous strategies for dealing with that were not very helpful.  The patient reports that if she is not taking any medications that she just stays in bed, has no energy and significant difficulty with motivation.  The patient reports that this was true even when she was a young child.   The patient reports that she had an exacerbation of her difficulties after her chemotherapy treatment for breast cancer.  The patient reports that initially she experienced chemo brain but got better and then in 2017 her symptoms returned and she deteriorated.  The patient reports that typically she would have times of hyperfocus to get caught up now she cannot hyperfocus and has difficulty keeping up with paperwork.  The patient reports that she made improvements while out on FMLA after her mother and close aunt died.   The patient reports that she has seen a psychiatrist for 20 years in [place].   Patient reports being extremely stressed at work and feeling like she is extremely behind on her caseload and paperwork.  Her job has allowed her to start coming in at 10 AM to help her get better sleep.  Even with this adjustment the patient has difficulty getting into work before 11 AM.  The patient reports that she cant get her thoughts focused  enough to do anything even at home.  She is trying to work long enough to retire early.    The patient is not sleeping well but no significant findings on sleep study beyond delayed REM onset.  The patient had been on extreme low fat diet for many years but has stopped this diet.     The patient now has sudden onset of anxiety and panic attacks.     During today's visit the patient presents for a follow-up session  to discuss ongoing management of mood and attentional difficulties.   There is a history of breast cancer 15 years prior, which was not estrogen-positive, and patient has continued to have a lot of stress around her overall medical status.  The patient has a lifelong history of attention and concentration issues which are primarily arousal level issues and have been generally consistent throughout life.  While there have been considerations of bipolar affective disorder as being a diagnostic consideration the consistency of her symptoms over decades really point towards anxiety and depression, fundamental issues of sleep-wake cycles and overall arousal levels and attentional difficulties.   There is also a long-standing history of anxiety, major depression, and anhedonia.  Reports feeling somewhat better since initiating Hormone Replacement Therapy (HRT) around June/July of 2025 for severe menopausal symptoms, including vaginal atrophy and mood lability. Initially started on vaginal estrogen, which improved the atrophy. Progesterone  was added due to having a uterus, which improved mood and anxiety but caused significant sedation, daytime fog, and difficulty functioning. Attempts to lower the dose and use a compounded version were unsuccessful in mitigating sedation. Less than two weeks ago, the OB/GYN switched to a different progestin, which is reportedly less sedating, but still results in very deep sleep and difficulty waking to alarms, though this is slightly improving. Expresses confusion and stress due to receiving conflicting advice from two different providers (an OB/GYN and a nurse practitioner specializing in gerontology) regarding the necessity of systemic progesterone  with vaginal estrogen.  Treatment Interventions:  Cognitive Behavioral and coping strategies along with continued history.  Explored the individual's experience with new HRT regimen and the associated side effects of sedation. Provided  psychoeducation on the evolving nature of medical research and treatment guidelines, particularly regarding HRT, to help contextualize the conflicting provider advice. Discussed risk-benefit analysis for quality-of-life interventions versus potential health risks. Provided detailed psychoeducation on the mechanism of action of SSRIs, specifically Prozac, including its common side effect profile, low risk of black box warnings for this individual, and the typical timeline for therapeutic effect. Differentiated SSRIs from SNRIs and explained the rationale for starting with a slow titration of Prozac. Discussed the use of a single-case study (ABAB) design to evaluate the effectiveness of quality-of-life interventions.  Participation Level:   Active  Participation Quality:  Appropriate and Redirectable      Behavioral Observation:  Well Groomed, Alert, and Appropriate.   Current Psychosocial Factors: Reports significant stress related to navigating conflicting medical advice from different providers regarding HRT management. Expresses concern about the side effect of sedation from progesterone /progestin impacting daily functioning and productivity, requiring extra stimulants (ADD medication) to function in the morning. Experiences a daytime fog that lasts until mid-afternoon. Describes long-term struggles with anhedonia and low energy, which have been present since childhood and impact quality of life.  Content of Session:   Reviewed recent changes in HRT, including the switch to a new progestin and its effects on sleep and daytime functioning. Discussed the recent recommendation from a psychiatrist  to start Prozac for depression and anhedonia. Explored hesitancy to start another medication and the decision to first evaluate the impact of HRT. Extensive psychoeducation was provided regarding the mechanism and side-effect profile of Prozac and other antidepressants to address concerns. Discussed the importance  of considering individual risk factors and quality of life when making treatment decisions. Explored the use of supplements and the need for caution due to lack of regulation and research.    Effectiveness of Interventions: Engaged well with psychoeducation, reporting a better understanding of the rationale behind changing medical advice over time. Appeared receptive to the explanation of Prozac's mechanism and risk profile. Reports that HRT has led to a marked difference in mood, with decreased anxiety and irritability.  Also addressed issues related to sleep hygiene as the patient has been researching aspects of bite therapies that reinforced for her previous discussions we have had regarding summarizing sunset and proper regulation of sleep-wake cycles relative to sun cycles.  Target Goals:   Focus remains on managing symptoms of depression, anhedonia, attention and anxiety. A primary goal is to improve energy levels and daily functioning. Continues to address the impact of long-term sleep disturbance and medical condition. The individual is actively exploring interventions to improve bone density and muscle strength. Reports being on a low-carbohydrate, low-sugar diet.   Goals Last Reviewed:   08/05/2024  Goals Addressed Today:    Followed up on the use of HRT for menopausal and mood symptoms. Addressed confusion and distress from conflicting medical advice. Discussed the potential trial of Prozac for anhedonia and depression, with psychoeducation provided on its mechanism and side effect profile. Discussed the long-standing nature of low energy and anhedonia, contextualizing them as likely neurobiological in origin rather than purely hormonal.  Impression/Diagnosis:   Susan Soto is a 58 year old female referred by Ole Gores, MD with [facility].  The patient has a history of sleep disturbance with reports of cognitive disturbance and bilateral carpal tunnel syndrome.  The patient  has been out of work after taking a medical leave in 2017 but then returning to work in a different job.  The patient has a history of major depressive disorder, adult residual attention deficit disorder and potentially residual cognitive difficulties after being diagnosed and treated for breast cancer in 2009 with both chemotherapy and radiation.  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    Diagnosis:   Generalized anxiety disorder  Panic disorder  Attention deficit hyperactivity disorder (ADHD), predominantly inattentive type  Cognitive and neurobehavioral dysfunction  Sleep disturbance  Chronic fatigue    Norleen Asa, Psy.D. Clinical Psychologist Neuropsychologist

## 2024-08-20 ENCOUNTER — Ambulatory Visit
Admission: RE | Admit: 2024-08-20 | Discharge: 2024-08-20 | Disposition: A | Discharge status: Home / Self Care | Source: Ambulatory Visit | Attending: Family Medicine | Admitting: Family Medicine

## 2024-08-20 DIAGNOSIS — M810 Age-related osteoporosis without current pathological fracture: Secondary | ICD-10-CM | POA: Insufficient documentation

## 2024-08-27 ENCOUNTER — Ambulatory Visit: Payer: Self-pay | Admitting: Family Medicine

## 2024-08-27 DIAGNOSIS — M810 Age-related osteoporosis without current pathological fracture: Secondary | ICD-10-CM

## 2024-10-06 ENCOUNTER — Telehealth: Payer: Self-pay

## 2024-10-06 NOTE — Telephone Encounter (Signed)
 TRIAGE VOICEMAIL: Patient states she is on HRT-estradiol  cream and was also put on Aygestin . She had some break thru bleeding. Inquiring if she needs to be seen. She would like to speak with Dr. Leigh about maybe trying a different type of progesterone  to go with the estradiol  cream. It really had some benefits, but the bleeding kinda scared her.

## 2024-10-08 ENCOUNTER — Telehealth: Payer: Self-pay

## 2024-10-08 NOTE — Telephone Encounter (Signed)
 Message sent to [facility]. Additional info added to previous triage message earlier this week.

## 2024-10-08 NOTE — Telephone Encounter (Signed)
 Pt called back wanting to know if there is something she can take to replace the norethindrone ? She had bleeding just that one day. She has stopped the estradiol  cream and norethindrone . She does not feel safe to continuing the Norethindrone . She thinks the cream is fine to continue. Pt needs to know if she should be seen or can something be sent in?

## 2024-10-09 NOTE — Telephone Encounter (Signed)
 Spoke with pt, pt agrees to come in to speak with Dr Leigh about the progesterone . States she needs some kind of it she did feel better while on it. Was just worried about the break through bleeding, she has not had any bleeding in 10 years. Pt transferred to schedule an appointment.

## 2024-11-11 NOTE — Progress Notes (Signed)
 "   GYNECOLOGY PROGRESS NOTE  Subjective:  PCP: Rilla Baller, MD  Patient ID: Susan Soto, female    DOB: [birth year], 59 y.o.   MRN: [id number]  HPI  Patient is a 59 y.o. [id number] female who presents for HRT follow up.  Patient started on Estrace  cream in May '25 for vaginal symptoms and due to her PMH of triple negative breast cancer, desired to be on a Progestin, despite counseling regarding no need for Progestin with vaginal estrogen only. She was started on continuous Prometrium  100mg  which helped her moods and irritability, but made her drowsy. In Sep '25, the drowsiness became too much, pt desires to continue Progestin, despite further counseling that it was not necessary, and desired to stay on for her mood symptoms. We switched to Aygestin  at that time.   She reports she had some bleeding in October after the progestin swap so she discontinued her Aygestin .  She also reports she does not like taking synthetic drugs so she does not want to take it anymore, and would like only a non-synthetic medication instead.  She does report she got a progesterone  gel a month ago from a geriatric NP that she applies to her arm daily but does not notice any benefit to her moods or feel that it is helping.  The following portions of the patient's history were reviewed and updated as appropriate: allergies, current medications, past family history, past medical history, past social history, past surgical history, and problem list.  Review of Systems Pertinent items are noted in HPI.   Objective:   Blood pressure 138/88, pulse 89, height 5' 5 (1.651 m), weight 152 lb (68.9 kg), last menstrual period 03/04/2014. Body mass index is 25.29 kg/m.  General appearance: alert, cooperative, and anxious Abdomen: soft, non-tender; bowel sounds normal; no masses,  no organomegaly Pelvic: cervix normal in appearance, external genitalia normal, no adnexal masses or tenderness, no cervical motion tenderness,  rectovaginal septum normal, uterus normal size, shape, and consistency, and vagina normal without discharge Extremities: extremities normal, atraumatic, no cyanosis or edema Neurologic: Grossly normal  Endometrial Biopsy Procedure Note The patient was positioned on the exam table in the dorsal lithotomy position. Bimanual exam confirmed uterine position and size. A sterile speculum was placed into the vagina. A single toothed tenaculum was placed onto the anterior lip of the cervix. The pipette was placed into the endocervical canal and advanced to the uterine fundus. Using a piston like technique, with vacuum created by withdrawing the stylus, the endometrial specimen was obtained and transferred to the biopsy container. Minimal bleeding encountered. The procedure was well tolerated.   Uterine Position: mid   Uterine Length: 8cm   Uterine Specimen: Scant  Assessment/Plan:   1. Postmenopausal bleeding   2. Postmenopausal disorder     59 y.o. [id number] with post-menopausal VVA and PMH of triple-negative breast cancer in 2009, discharged from oncology in 2019 after no recurrence, and started on vaginal Estrace  in May '25. Pt with strong preference to also continue Progestin therapy for her moods, despite multiple discussions regarding not needed Progestin with topical estradiol . Today we again had a lengthy discussion about patient desiring to stay on the Progestin, only wanting bio-identical, but not wanting Prometrium  due to drowsiness. Has now failed Aygestin  due to bleeding and stopping (was counseled not to stop), and EMB done today for the PMB which was likely from the progestin switch and then sudden withdrawal. Had to re-focus patient several times during our discussion from repeating  the same information, going onto tangential side-discussions, and being argumentative. Apologized if my communication was upsetting her or not effective, as she seemed frustrated and upset. Today, patient  continued to question my judgement and I invited her to find a new provider that she feels more comfortable with if that would help her reach her health goals. I reiterated that Progestin is not first line for her cognitive symptoms, nor is it mandatory for using topical estradiol  as she kept insisting, but I have kept her on it due to the low risks of using and her own perceived benefit. She desires to stay on it and I am happy to continue to manage it. Made her aware that the only bioidentical form makes her too drowsy, so she agrees to trial another synthetic form. We will try Provera  2.5mg  daily for the next 3 mos, follow up then, sooner prn. Aware she may experience further unscheduled bleeding with the switch, and should not self-discontinue the medication for bleeding alone.   I personally spent a total of 42 minutes in the care of the patient today including preparing to see the patient, getting/reviewing separately obtained history, performing a medically appropriate exam/evaluation, counseling and educating, placing orders, documenting clinical information in the EHR, and communicating results.    Estil Mangle, DO [facility] "

## 2024-11-17 ENCOUNTER — Ambulatory Visit: Admitting: Obstetrics

## 2024-11-17 ENCOUNTER — Other Ambulatory Visit (HOSPITAL_COMMUNITY)
Admission: RE | Admit: 2024-11-17 | Discharge: 2024-11-17 | Disposition: A | Discharge status: Home / Self Care | Source: Ambulatory Visit | Attending: Obstetrics | Admitting: Obstetrics

## 2024-11-17 ENCOUNTER — Encounter: Payer: Self-pay | Admitting: Obstetrics

## 2024-11-17 VITALS — BP 138/88 | HR 89 | Ht 65.0 in | Wt 152.0 lb

## 2024-11-17 DIAGNOSIS — N950 Postmenopausal bleeding: Secondary | ICD-10-CM | POA: Diagnosis not present

## 2024-11-17 DIAGNOSIS — N951 Menopausal and female climacteric states: Secondary | ICD-10-CM

## 2024-11-17 DIAGNOSIS — N858 Other specified noninflammatory disorders of uterus: Secondary | ICD-10-CM | POA: Diagnosis not present

## 2024-11-17 MED ORDER — MEDROXYPROGESTERONE ACETATE 5 MG PO TABS: 2.5000 mg | ORAL_TABLET | Freq: Every day | ORAL | 1 refills | Status: AC

## 2024-11-19 ENCOUNTER — Ambulatory Visit: Payer: Self-pay | Admitting: Obstetrics

## 2024-11-19 LAB — SURGICAL PATHOLOGY

## 2024-12-03 ENCOUNTER — Encounter: Admitting: Psychology

## 2025-01-11 ENCOUNTER — Other Ambulatory Visit

## 2025-01-18 ENCOUNTER — Encounter: Admitting: Family Medicine
